# Patient Record
Sex: Female | Born: 1985 | Hispanic: No | Smoking: Former smoker
Health system: Southern US, Community
[De-identification: ages and names within clinical notes are randomized; demographics above are authoritative.]

## PROBLEM LIST (undated history)

## (undated) ENCOUNTER — Inpatient Hospital Stay (HOSPITAL_COMMUNITY): Payer: Self-pay

## (undated) ENCOUNTER — Ambulatory Visit (HOSPITAL_COMMUNITY): Admission: EM | Payer: Medicaid Other | Source: Home / Self Care

## (undated) DIAGNOSIS — K56609 Unspecified intestinal obstruction, unspecified as to partial versus complete obstruction: Secondary | ICD-10-CM

## (undated) DIAGNOSIS — N189 Chronic kidney disease, unspecified: Secondary | ICD-10-CM

## (undated) DIAGNOSIS — T7840XA Allergy, unspecified, initial encounter: Secondary | ICD-10-CM

## (undated) DIAGNOSIS — O24419 Gestational diabetes mellitus in pregnancy, unspecified control: Secondary | ICD-10-CM

## (undated) HISTORY — PX: SMALL INTESTINE SURGERY: SHX150

## (undated) HISTORY — PX: ABDOMINAL SURGERY: SHX537

## (undated) HISTORY — DX: Allergy, unspecified, initial encounter: T78.40XA

---

## 2006-09-19 ENCOUNTER — Emergency Department (HOSPITAL_COMMUNITY): Admission: EM | Admit: 2006-09-19 | Discharge: 2006-09-19 | Payer: Self-pay | Admitting: Family Medicine

## 2007-11-26 ENCOUNTER — Emergency Department (HOSPITAL_COMMUNITY): Admission: EM | Admit: 2007-11-26 | Discharge: 2007-11-26 | Payer: Self-pay | Admitting: Family Medicine

## 2007-12-03 ENCOUNTER — Encounter: Admission: RE | Admit: 2007-12-03 | Discharge: 2007-12-03 | Payer: Self-pay | Admitting: Chiropractic Medicine

## 2007-12-08 ENCOUNTER — Ambulatory Visit: Payer: Self-pay | Admitting: Cardiology

## 2009-06-12 ENCOUNTER — Emergency Department (HOSPITAL_COMMUNITY): Admission: EM | Admit: 2009-06-12 | Discharge: 2009-06-12 | Payer: Self-pay | Admitting: Family Medicine

## 2009-12-26 ENCOUNTER — Emergency Department (HOSPITAL_COMMUNITY): Admission: EM | Admit: 2009-12-26 | Discharge: 2009-12-26 | Payer: Self-pay | Admitting: Family Medicine

## 2010-06-19 ENCOUNTER — Emergency Department (HOSPITAL_COMMUNITY): Admission: EM | Admit: 2010-06-19 | Discharge: 2010-06-19 | Payer: Self-pay | Admitting: Family Medicine

## 2010-10-22 LAB — POCT URINALYSIS DIPSTICK
Bilirubin Urine: NEGATIVE
Glucose, UA: 100 mg/dL — AB
Protein, ur: NEGATIVE mg/dL
Specific Gravity, Urine: 1.02 (ref 1.005–1.030)
Urobilinogen, UA: 2 mg/dL — ABNORMAL HIGH (ref 0.0–1.0)

## 2010-10-22 LAB — POCT PREGNANCY, URINE: Preg Test, Ur: NEGATIVE

## 2010-10-22 LAB — GC/CHLAMYDIA PROBE AMP, GENITAL: GC Probe Amp, Genital: NEGATIVE

## 2010-10-28 LAB — POCT I-STAT, CHEM 8
BUN: 11 mg/dL (ref 6–23)
Calcium, Ion: 1.11 mmol/L — ABNORMAL LOW (ref 1.12–1.32)
Chloride: 106 mEq/L (ref 96–112)
Creatinine, Ser: 0.7 mg/dL (ref 0.4–1.2)
Hemoglobin: 17 g/dL — ABNORMAL HIGH (ref 12.0–15.0)
Potassium: 3.9 mEq/L (ref 3.5–5.1)
Sodium: 138 mEq/L (ref 135–145)
TCO2: 22 mmol/L (ref 0–100)

## 2011-01-29 ENCOUNTER — Inpatient Hospital Stay (INDEPENDENT_AMBULATORY_CARE_PROVIDER_SITE_OTHER)
Admission: RE | Admit: 2011-01-29 | Discharge: 2011-01-29 | Disposition: A | Discharge status: Home / Self Care | Payer: Self-pay | Source: Ambulatory Visit | Attending: Emergency Medicine | Admitting: Emergency Medicine

## 2011-01-29 DIAGNOSIS — N390 Urinary tract infection, site not specified: Secondary | ICD-10-CM

## 2011-01-29 LAB — POCT URINALYSIS DIP (DEVICE)
Bilirubin Urine: NEGATIVE
Specific Gravity, Urine: 1.015 (ref 1.005–1.030)
Urobilinogen, UA: 0.2 mg/dL (ref 0.0–1.0)

## 2011-07-10 ENCOUNTER — Encounter (HOSPITAL_COMMUNITY): Payer: Self-pay | Admitting: Emergency Medicine

## 2011-07-10 ENCOUNTER — Emergency Department (INDEPENDENT_AMBULATORY_CARE_PROVIDER_SITE_OTHER): Payer: Self-pay

## 2011-07-10 ENCOUNTER — Inpatient Hospital Stay (HOSPITAL_COMMUNITY)
Admission: EM | Admit: 2011-07-10 | Discharge: 2011-07-16 | DRG: 390 | Disposition: A | Discharge status: Home / Self Care | Payer: Self-pay | Source: Ambulatory Visit | Attending: General Surgery | Admitting: General Surgery

## 2011-07-10 ENCOUNTER — Emergency Department (HOSPITAL_COMMUNITY)
Admission: EM | Admit: 2011-07-10 | Discharge: 2011-07-10 | Disposition: A | Discharge status: Nursing Home (Includes ICF) | Payer: Self-pay | Source: Home / Self Care | Attending: Family Medicine | Admitting: Family Medicine

## 2011-07-10 ENCOUNTER — Encounter: Payer: Self-pay | Admitting: Emergency Medicine

## 2011-07-10 ENCOUNTER — Emergency Department (HOSPITAL_COMMUNITY): Payer: Self-pay

## 2011-07-10 DIAGNOSIS — K56609 Unspecified intestinal obstruction, unspecified as to partial versus complete obstruction: Principal | ICD-10-CM | POA: Diagnosis present

## 2011-07-10 DIAGNOSIS — Z880 Allergy status to penicillin: Secondary | ICD-10-CM

## 2011-07-10 DIAGNOSIS — R109 Unspecified abdominal pain: Secondary | ICD-10-CM

## 2011-07-10 DIAGNOSIS — K56600 Partial intestinal obstruction, unspecified as to cause: Secondary | ICD-10-CM | POA: Diagnosis present

## 2011-07-10 DIAGNOSIS — F17200 Nicotine dependence, unspecified, uncomplicated: Secondary | ICD-10-CM | POA: Diagnosis present

## 2011-07-10 LAB — POCT URINALYSIS DIP (DEVICE)
Glucose, UA: NEGATIVE mg/dL
Hgb urine dipstick: NEGATIVE
pH: 7 (ref 5.0–8.0)

## 2011-07-10 LAB — URINALYSIS, ROUTINE W REFLEX MICROSCOPIC
Glucose, UA: 100 mg/dL — AB
Ketones, ur: 40 mg/dL — AB
Protein, ur: 30 mg/dL — AB
Urobilinogen, UA: 1 mg/dL (ref 0.0–1.0)

## 2011-07-10 LAB — CBC
HCT: 49.2 % — ABNORMAL HIGH (ref 36.0–46.0)
MCH: 31.2 pg (ref 26.0–34.0)
MCV: 88.3 fL (ref 78.0–100.0)
Platelets: 321 10*3/uL (ref 150–400)
RBC: 5.57 MIL/uL — ABNORMAL HIGH (ref 3.87–5.11)
WBC: 18.4 10*3/uL — ABNORMAL HIGH (ref 4.0–10.5)

## 2011-07-10 LAB — BASIC METABOLIC PANEL
Chloride: 98 mEq/L (ref 96–112)
Creatinine, Ser: 0.53 mg/dL (ref 0.50–1.10)
GFR calc Af Amer: >90 mL/min (ref >90)
GFR calc non Af Amer: >90 mL/min (ref >90)
Sodium: 133 mEq/L — ABNORMAL LOW (ref 135–145)

## 2011-07-10 LAB — DIFFERENTIAL
Basophils Absolute: 0 10*3/uL (ref 0.0–0.1)
Eosinophils Absolute: 0 10*3/uL (ref 0.0–0.7)
Eosinophils Relative: 0 % (ref 0–5)
Lymphocytes Relative: 6 % — ABNORMAL LOW (ref 12–46)
Monocytes Absolute: 0.4 10*3/uL (ref 0.1–1.0)
Monocytes Relative: 2 % — ABNORMAL LOW (ref 3–12)
Neutro Abs: 16.9 10*3/uL — ABNORMAL HIGH (ref 1.7–7.7)
Neutrophils Relative %: 92 % — ABNORMAL HIGH (ref 43–77)

## 2011-07-10 LAB — POCT PREGNANCY, URINE
Preg Test, Ur: NEGATIVE
Preg Test, Ur: NEGATIVE

## 2011-07-10 MED ORDER — ONDANSETRON 4 MG PO TBDP
ORAL_TABLET | ORAL | Status: AC
  Filled 2011-07-10: qty 2

## 2011-07-10 MED ORDER — ONDANSETRON HCL 4 MG/2ML IJ SOLN
4.0000 mg | Freq: Once | INTRAMUSCULAR | Status: DC
  Filled 2011-07-10 (×3): qty 2

## 2011-07-10 MED ORDER — MORPHINE SULFATE 4 MG/ML IJ SOLN
4.0000 mg | Freq: Once | INTRAMUSCULAR | Status: AC
  Administered 2011-07-10: 4 mg via INTRAVENOUS
  Filled 2011-07-10: qty 1

## 2011-07-10 MED ORDER — SODIUM CHLORIDE 0.9 % IV BOLUS (SEPSIS)
1000.0000 mL | Freq: Once | INTRAVENOUS | Status: AC
  Administered 2011-07-10: 1000 mL via INTRAVENOUS

## 2011-07-10 MED ORDER — IOHEXOL 300 MG/ML  SOLN
100.0000 mL | Freq: Once | INTRAMUSCULAR | Status: AC | PRN
  Administered 2011-07-10: 100 mL via INTRAVENOUS

## 2011-07-10 MED ORDER — ONDANSETRON 8 MG PO TBDP: 8.0000 mg | ORAL_TABLET | Freq: Once | ORAL | Status: DC

## 2011-07-10 NOTE — ED Notes (Signed)
Pt back from X-ray.  States she feels "good".  Denies nausea or pain at this time.

## 2011-07-10 NOTE — ED Notes (Signed)
Pt here with c/o sudden onset lower abd cramping radiating to lower back that started last night with diff sleeping.pt also c/o n/v with sx.x3 emesis today,chills.pt states she has been having diff with defecation so she took laxative but no changes in sx.last bm yesterday but small.

## 2011-07-10 NOTE — ED Notes (Signed)
Family at bedside. 

## 2011-07-10 NOTE — ED Notes (Signed)
PT. REPORTS MID ABDOMINAL PAIN /LOW BACK PAIN ONSET LAST NIGHT , DENIES DYSURIA ,  VOMITTING WITH NO DIARRHEA ,  SLIGHT CHILLS.

## 2011-07-10 NOTE — ED Notes (Signed)
IV team called to start IV,  After 2 attempts

## 2011-07-10 NOTE — ED Provider Notes (Signed)
History     CSN: [id number] Arrival date & time: 07/10/2011  6:46 PM   First MD Initiated Contact with Patient 07/10/11 2008      Chief Complaint  Patient presents with  . Abdominal Pain    (Consider location/radiation/quality/duration/timing/severity/associated sxs/prior treatment) HPI Comments: Pt presented from Urgent care w chief complaint of , pain, nausea, and vomiting without diarrhea.  Patient was sent from urgent care for possible small bowel obstruction.  Patient does have a history of abdominal surgery as a child however she is unaware of what the surgery was.  Patient has been unable to hold down fluids.  She states she is dehydrated and having abdominal pain as well.  The pain is diffuse and rated at a 10 out of 10.  Patient denies fever, night sweats, or chills.  She has no other complaints.  The history is provided by the patient.    History reviewed. No pertinent past medical history.  History reviewed. No pertinent past surgical history.  No family history on file.  History  Substance Use Topics  . Smoking status: Current Everyday Smoker  . Smokeless tobacco: Not on file  . Alcohol Use: Yes    OB History    Grav Para Term Preterm Abortions TAB SAB Ect Mult Living                  Review of Systems  Constitutional: Positive for appetite change. Negative for fever and chills.  HENT: Negative for neck stiffness and dental problem.   Eyes: Negative for visual disturbance.  Respiratory: Negative for cough, chest tightness, shortness of breath and wheezing.   Cardiovascular: Negative for chest pain.  Gastrointestinal: Positive for nausea, vomiting and abdominal pain. Negative for diarrhea, constipation, blood in stool, abdominal distention, anal bleeding and rectal pain.  Genitourinary: Negative for dysuria, urgency, hematuria and flank pain.  Musculoskeletal: Negative for myalgias and arthralgias.  Skin: Negative for rash.  Neurological: Negative for  dizziness, syncope, speech difficulty, numbness and headaches.  Hematological: Does not bruise/bleed easily.  All other systems reviewed and are negative.    Allergies  Penicillins  Home Medications   Current Outpatient Rx  Name Route Sig Dispense Refill  . IBUPROFEN 600 MG PO TABS Oral Take 600 mg by mouth every 6 (six) hours as needed. For pain       BP 101/68  Pulse 87  Temp(Src) 97.7 F (36.5 C) (Oral)  Resp 19  SpO2 94%  LMP 06/11/2011  Physical Exam  Nursing note and vitals reviewed. Constitutional: She is oriented to person, place, and time. She appears well-developed and well-nourished. No distress.  HENT:  Head: Normocephalic and atraumatic.  Eyes: EOM are normal.       Normal appearance  Neck: Normal range of motion.  Cardiovascular: Normal rate and regular rhythm.   Pulmonary/Chest: Effort normal and breath sounds normal.  Abdominal: Soft. She exhibits no distension and no mass. Bowel sounds are decreased. There is generalized tenderness. There is no rebound and no guarding.       No CVA tenderness  Musculoskeletal: Normal range of motion.  Neurological: She is alert and oriented to person, place, and time.  Skin: Skin is warm and dry. No rash noted.  Psychiatric: She has a normal mood and affect. Her behavior is normal.    ED Course  Procedures (including critical care time)  Labs Reviewed  BASIC METABOLIC PANEL - Abnormal; Notable for the following:    Sodium 133 (*)  Glucose, Bld 136 (*)    All other components within normal limits  CBC - Abnormal; Notable for the following:    WBC 18.4 (*)    RBC 5.57 (*)    Hemoglobin 17.4 (*)    HCT 49.2 (*)    All other components within normal limits  DIFFERENTIAL - Abnormal; Notable for the following:    Neutrophils Relative 92 (*)    Neutro Abs 16.9 (*)    Lymphocytes Relative 6 (*)    Monocytes Relative 2 (*)    All other components within normal limits  URINALYSIS, ROUTINE W REFLEX MICROSCOPIC -  Abnormal; Notable for the following:    Color, Urine AMBER (*) BIOCHEMICALS MAY BE AFFECTED BY COLOR   APPearance CLOUDY (*)    Glucose, UA 100 (*)    Ketones, ur 40 (*)    Protein, ur 30 (*)    Leukocytes, UA SMALL (*)    All other components within normal limits  URINE MICROSCOPIC-ADD ON - Abnormal; Notable for the following:    Squamous Epithelial / LPF MANY (*)    Bacteria, UA FEW (*)    All other components within normal limits  POCT PREGNANCY, URINE  POCT PREGNANCY, URINE   Dg Abd 1 View  07/10/2011  *RADIOLOGY REPORT*  Clinical Data: Lower abdominal pain  ABDOMEN - 1 VIEW  Comparison: None.  Findings: Distended small bowel loops in the left upper quadrant are noted.  Moderate stool in the ascending and proximal transverse colon.  No pneumatosis.  No obvious free intraperitoneal gas.  Bony framework is unremarkable.  Unremarkable soft tissues.  Square radiodensity projects over the pelvis just to the right of midline of unknown significance and may be external to the patient.  IMPRESSION: Dilated small bowel loops in the left upper quadrant as described worrisome for a early or partial small bowel obstruction pattern.  Original Report Authenticated By: Donavan Burnet, M.D.   Ct Abdomen Pelvis W Contrast  07/10/2011  *RADIOLOGY REPORT*  Clinical Data: Abdominal pain, nausea and vomiting.  CT ABDOMEN AND PELVIS WITH CONTRAST  Technique:  Multidetector CT imaging of the abdomen and pelvis was performed following the standard protocol during bolus administration of intravenous contrast.  Contrast: OMNIPAQUE IOHEXOL 300 MG/ML IV SOLN  Comparison: None.  Findings: There is evidence of small bowel obstruction with diffuse dilatation of the jejunum. There is a discrete transition point in the midline upper pelvis either at the level of the distal jejunum or proximal ileum.  No evidence of bowel perforation or focal abscess.  Associated small amount of free fluid is present in the pelvis and  adjacent to the liver.  The proximal colon contains some stool. The distal colon is decompressed.  There is mild fatty infiltration of the liver.  The gallbladder is unremarkable.  The pancreas, spleen, adrenal glands and kidneys are within normal limits.  No masses or enlarged lymph nodes.  No hernias.  Bony structures are unremarkable.  No abnormal calcifications.  IMPRESSION: Small bowel obstruction with diffuse dilatation of the jejunum.  A discrete transition point in the midline upper pelvis is located either at the level of the distal jejunum or proximal ileum.  Original Report Authenticated By: Reola Calkins, M.D.    12:28 AM  Small bowel obstruction found on CT.  Dr. Janee Morn of Gen[facility] surgery will come to the emergency department and admit patient.  Patient states that her abdominal pain and nausea are starting to return.  4 morphine  and 4 Zofran will be given.  Patient is otherwise hemodynamically stable and has no complaints.   MDM  SBO        [place], [place] 07/11/11 0028

## 2011-07-10 NOTE — ED Provider Notes (Signed)
History     CSN: [id number] Arrival date & time: 07/10/2011  4:11 PM   First MD Initiated Contact with Patient 07/10/11 1541      Chief Complaint  Patient presents with  . Abdominal Cramping  . Nausea  . Emesis    (Consider location/radiation/quality/duration/timing/severity/associated sxs/prior treatment) HPI Comments: Suzanne Lane presents for evaluation of abdominal pain, acute onset yesterday. She reports sharp lower abdominal pain that started last night. She reports some constipation over the last 2 days. 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   Patient is a 25 y.o. female presenting with abdominal pain. The history is provided by the patient.  Abdominal Pain The primary symptoms of the illness include abdominal pain and nausea. The primary symptoms of the illness do not include fever or hematochezia. The current episode started yesterday. The problem has not changed since onset. The illness is associated with awakening from sleep. The patient states that she believes she is currently not pregnant. The patient has had a change in bowel habit. Additional symptoms associated with the illness include constipation.    History reviewed. No pertinent past medical history.  History reviewed. No pertinent past surgical history.  No family history on file.  History  Substance Use Topics  . Smoking status: Current Everyday Smoker  . Smokeless tobacco: Not on file  . Alcohol Use: Yes    OB History    Grav Para Term Preterm Abortions TAB SAB Ect Mult Living                  Review of Systems  Constitutional: Negative.  Negative for fever.  HENT: Negative.   Eyes: Negative.   Respiratory: Negative.   Cardiovascular: Negative.   Gastrointestinal: Positive for nausea, abdominal pain and constipation. Negative for hematochezia.  Genitourinary: Negative.   Musculoskeletal: Negative.   Skin: Negative.   Neurological: Negative.     Allergies    Penicillins  Home Medications  No current outpatient prescriptions on file.  BP 135/63  Pulse 72  Temp(Src) 99 F (37.2 C) (Oral)  Resp 16  SpO2 98%  LMP 06/11/2011  Physical Exam  Nursing note and vitals reviewed. Constitutional: She is oriented to person, place, and time. She appears well-developed and well-nourished.  HENT:  Head: Normocephalic and atraumatic.  Eyes: EOM are normal.  Neck: Normal range of motion.  Pulmonary/Chest: Effort normal.  Abdominal: Soft. Bowel sounds are normal. There is tenderness.  Neurological: She is alert and oriented to person, place, and time.  Skin: Skin is warm and dry.    ED Course  Procedures (including critical care time)  Labs Reviewed  POCT URINALYSIS DIP (DEVICE) - Abnormal; Notable for the following:    Bilirubin Urine SMALL (*)    Ketones, ur TRACE (*)    Protein, ur 30 (*)    Leukocytes, UA TRACE (*) Biochemical Testing Only. Please order routine urinalysis from main lab if confirmatory testing is needed.   All other components within normal limits  POCT PREGNANCY, URINE  POCT URINALYSIS DIPSTICK   Dg Abd 1 View  07/10/2011  *RADIOLOGY REPORT*  Clinical Data: Lower abdominal pain  ABDOMEN - 1 VIEW  Comparison: None.  Findings: Distended small bowel loops in the left upper quadrant are noted.  Moderate stool in the ascending and proximal transverse colon.  No pneumatosis.  No obvious free intraperitoneal gas.  Bony framework is unremarkable.  Unremarkable soft tissues.  Square radiodensity projects over the pelvis just  to the right of midline of unknown significance and may be external to the patient.  IMPRESSION: Dilated small bowel loops in the left upper quadrant as described worrisome for a early or partial small bowel obstruction pattern.  Original Report Authenticated By: Donavan Burnet, M.D.     No diagnosis found.    MDM  Abdomen x-ray: stool burden; gas pattern worrisome for early or partial SBO (per  radiologist, per above)        Richardo Priest, MD 07/10/11 1728

## 2011-07-10 NOTE — ED Notes (Signed)
Returned from CT.

## 2011-07-11 ENCOUNTER — Encounter (HOSPITAL_COMMUNITY): Payer: Self-pay | Admitting: General Surgery

## 2011-07-11 LAB — CBC
HCT: 43.3 % (ref 36.0–46.0)
Hemoglobin: 15 g/dL (ref 12.0–15.0)
MCH: 30.9 pg (ref 26.0–34.0)
MCHC: 34.6 g/dL (ref 30.0–36.0)
MCV: 89.1 fL (ref 78.0–100.0)
RBC: 4.86 MIL/uL (ref 3.87–5.11)

## 2011-07-11 LAB — BASIC METABOLIC PANEL
BUN: 10 mg/dL (ref 6–23)
CO2: 22 mEq/L (ref 19–32)
Chloride: 103 mEq/L (ref 96–112)
Glucose, Bld: 100 mg/dL — ABNORMAL HIGH (ref 70–99)
Potassium: 3.4 mEq/L — ABNORMAL LOW (ref 3.5–5.1)
Sodium: 138 mEq/L (ref 135–145)

## 2011-07-11 MED ORDER — ONDANSETRON HCL 4 MG/2ML IJ SOLN
4.0000 mg | Freq: Four times a day (QID) | INTRAMUSCULAR | Status: DC | PRN
  Administered 2011-07-11 – 2011-07-14 (×5): 4 mg via INTRAVENOUS
  Filled 2011-07-11 (×2): qty 2

## 2011-07-11 MED ORDER — PANTOPRAZOLE SODIUM 40 MG IV SOLR
40.0000 mg | Freq: Every day | INTRAVENOUS | Status: DC
  Administered 2011-07-11 – 2011-07-14 (×4): 40 mg via INTRAVENOUS
  Filled 2011-07-11 (×5): qty 40

## 2011-07-11 MED ORDER — NICOTINE 14 MG/24HR TD PT24
14.0000 mg | MEDICATED_PATCH | Freq: Every day | TRANSDERMAL | Status: DC
  Administered 2011-07-11 – 2011-07-15 (×5): 14 mg via TRANSDERMAL
  Filled 2011-07-11 (×6): qty 1

## 2011-07-11 MED ORDER — KCL IN DEXTROSE-NACL 20-5-0.9 MEQ/L-%-% IV SOLN
INTRAVENOUS | Status: DC
  Administered 2011-07-11 – 2011-07-12 (×2): via INTRAVENOUS
  Administered 2011-07-12 – 2011-07-13 (×2): 125 mL/h via INTRAVENOUS
  Administered 2011-07-14 – 2011-07-16 (×4): via INTRAVENOUS
  Filled 2011-07-11 (×18): qty 1000

## 2011-07-11 MED ORDER — KCL IN DEXTROSE-NACL 20-5-0.9 MEQ/L-%-% IV SOLN
INTRAVENOUS | Status: DC
  Filled 2011-07-11 (×5): qty 1000

## 2011-07-11 MED ORDER — DIPHENHYDRAMINE HCL 12.5 MG/5ML PO ELIX: 12.5000 mg | ORAL_SOLUTION | Freq: Four times a day (QID) | ORAL | Status: DC | PRN

## 2011-07-11 MED ORDER — DIPHENHYDRAMINE HCL 50 MG/ML IJ SOLN
12.5000 mg | Freq: Four times a day (QID) | INTRAMUSCULAR | Status: DC | PRN
  Filled 2011-07-11: qty 1

## 2011-07-11 MED ORDER — MORPHINE SULFATE 2 MG/ML IJ SOLN
2.0000 mg | INTRAMUSCULAR | Status: DC | PRN
  Administered 2011-07-11 – 2011-07-12 (×8): 2 mg via INTRAVENOUS
  Administered 2011-07-12: 4 mg via INTRAVENOUS
  Administered 2011-07-12 – 2011-07-14 (×9): 2 mg via INTRAVENOUS
  Filled 2011-07-11 (×10): qty 1
  Filled 2011-07-11: qty 2
  Filled 2011-07-11 (×10): qty 1

## 2011-07-11 NOTE — ED Notes (Signed)
NG tube placed and verified by auscultation and suction contents (450 ml of clear fluid).  Placed on lo intermittent suction.

## 2011-07-11 NOTE — ED Notes (Signed)
Surgery in with pt.

## 2011-07-11 NOTE — ED Notes (Signed)
Dr Janee Morn here

## 2011-07-11 NOTE — H&P (Signed)
Suzanne Lane is an 25 y.o. female.   Chief Complaint: Abdominal pain, nausea and vomiting HPI: Patient developed crampy abdominal pain associated with nausea and vomiting over the past 24 hours. Patient has had similar episodes more mild in the past. She has not been seen by a physician for this recently. She describes her pain as crampy and radiates towards her back. She has not had a bowel movement today. She has been passing less gas than usual. 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  History reviewed. No pertinent past medical history.  History reviewed. No pertinent past surgical history. patient had abdominal surgery as a baby  No family history on file. Social History:  reports that she has been smoking.  She does not have any smokeless tobacco history on file. She reports that she drinks alcohol. She reports that she does not use illicit drugs.  Allergies:  Allergies  Allergen Reactions  . Penicillins     Hives     Medications Prior to Admission  Medication Dose Route Frequency Provider Last Rate Last Dose  . iohexol (OMNIPAQUE) 300 MG/ML injection 100 mL  100 mL Intravenous Once PRN Medication Radiologist   100 mL at 07/10/11 2338  . morphine 4 MG/ML injection 4 mg  4 mg Intravenous Once [place], [place]   4 mg at 07/10/11 2157  . ondansetron (ZOFRAN) injection 4 mg  4 mg Intramuscular Once [employer], [place]      . ondansetron (ZOFRAN-ODT) disintegrating tablet 8 mg  8 mg Oral Once Ethelda Chick, MD      . sodium chloride 0.9 % bolus 1,000 mL  1,000 mL Intravenous Once [place], [place]   1,000 mL at 07/10/11 2219  . DISCONTD: ondansetron (ZOFRAN-ODT) 4 MG disintegrating tablet            No current outpatient prescriptions on file as of 07/10/2011.    Results for orders placed during the hospital encounter of 07/10/11 (from the past 48 hour(s))  BASIC METABOLIC PANEL     Status:  Abnormal   Collection Time   07/10/11  8:19 PM      Component Value Range Comment   Sodium 133 (*) 135 - 145 (mEq/L)    Potassium 4.0  3.5 - 5.1 (mEq/L)    Chloride 98  96 - 112 (mEq/L)    CO2 23  19 - 32 (mEq/L)    Glucose, Bld 136 (*) 70 - 99 (mg/dL)    BUN 12  6 - 23 (mg/dL)    Creatinine, Ser 1.30  0.50 - 1.10 (mg/dL)    Calcium 9.3  8.4 - 10.5 (mg/dL)    GFR calc non Af Amer >90  >90 (mL/min)    GFR calc Af Amer >90  >90 (mL/min)   CBC     Status: Abnormal   Collection Time   07/10/11  8:19 PM      Component Value Range Comment   WBC 18.4 (*) 4.0 - 10.5 (K/uL)    RBC 5.57 (*) 3.87 - 5.11 (MIL/uL)    Hemoglobin 17.4 (*) 12.0 - 15.0 (g/dL)    HCT 86.5 (*) 78.4 - 46.0 (%)    MCV 88.3  78.0 - 100.0 (fL)    MCH 31.2  26.0 - 34.0 (pg)    MCHC 35.4  30.0 - 36.0 (g/dL)    RDW 69.6  29.5 - 28.4 (%)  Platelets 321  150 - 400 (K/uL)   DIFFERENTIAL     Status: Abnormal   Collection Time   07/10/11  8:19 PM      Component Value Range Comment   Neutrophils Relative 92 (*) 43 - 77 (%)    Neutro Abs 16.9 (*) 1.7 - 7.7 (K/uL)    Lymphocytes Relative 6 (*) 12 - 46 (%)    Lymphs Abs 1.1  0.7 - 4.0 (K/uL)    Monocytes Relative 2 (*) 3 - 12 (%)    Monocytes Absolute 0.4  0.1 - 1.0 (K/uL)    Eosinophils Relative 0  0 - 5 (%)    Eosinophils Absolute 0.0  0.0 - 0.7 (K/uL)    Basophils Relative 0  0 - 1 (%)    Basophils Absolute 0.0  0.0 - 0.1 (K/uL)   URINALYSIS, ROUTINE W REFLEX MICROSCOPIC     Status: Abnormal   Collection Time   07/10/11  8:49 PM      Component Value Range Comment   Color, Urine AMBER (*) YELLOW  BIOCHEMICALS MAY BE AFFECTED BY COLOR   APPearance CLOUDY (*) CLEAR     Specific Gravity, Urine 1.029  1.005 - 1.030     pH 6.5  5.0 - 8.0     Glucose, UA 100 (*) NEGATIVE (mg/dL)    Hgb urine dipstick NEGATIVE  NEGATIVE     Bilirubin Urine NEGATIVE  NEGATIVE     Ketones, ur 40 (*) NEGATIVE (mg/dL)    Protein, ur 30 (*) NEGATIVE (mg/dL)    Urobilinogen, UA 1.0  0.0 -  1.0 (mg/dL)    Nitrite NEGATIVE  NEGATIVE     Leukocytes, UA SMALL (*) NEGATIVE    URINE MICROSCOPIC-ADD ON     Status: Abnormal   Collection Time   07/10/11  8:49 PM      Component Value Range Comment   Squamous Epithelial / LPF MANY (*) RARE     WBC, UA 3-6  <3 (WBC/hpf)    RBC / HPF 0-2  <3 (RBC/hpf)    Bacteria, UA FEW (*) RARE     Urine-Other MUCOUS PRESENT     POCT PREGNANCY, URINE     Status: Normal   Collection Time   07/10/11 10:44 PM      Component Value Range Comment   Preg Test, Ur NEGATIVE      Dg Abd 1 View  07/10/2011  *RADIOLOGY REPORT*  Clinical Data: Lower abdominal pain  ABDOMEN - 1 VIEW  Comparison: None.  Findings: Distended small bowel loops in the left upper quadrant are noted.  Moderate stool in the ascending and proximal transverse colon.  No pneumatosis.  No obvious free intraperitoneal gas.  Bony framework is unremarkable.  Unremarkable soft tissues.  Square radiodensity projects over the pelvis just to the right of midline of unknown significance and may be external to the patient.  IMPRESSION: Dilated small bowel loops in the left upper quadrant as described worrisome for a early or partial small bowel obstruction pattern.  Original Report Authenticated By: Donavan Burnet, M.D.   Ct Abdomen Pelvis W Contrast  07/10/2011  *RADIOLOGY REPORT*  Clinical Data: Abdominal pain, nausea and vomiting.  CT ABDOMEN AND PELVIS WITH CONTRAST  Technique:  Multidetector CT imaging of the abdomen and pelvis was performed following the standard protocol during bolus administration of intravenous contrast.  Contrast: OMNIPAQUE IOHEXOL 300 MG/ML IV SOLN  Comparison: None.  Findings: There is evidence of small bowel obstruction  with diffuse dilatation of the jejunum. There is a discrete transition point in the midline upper pelvis either at the level of the distal jejunum or proximal ileum.  No evidence of bowel perforation or focal abscess.  Associated small amount of free  fluid is present in the pelvis and adjacent to the liver.  The proximal colon contains some stool. The distal colon is decompressed.  There is mild fatty infiltration of the liver.  The gallbladder is unremarkable.  The pancreas, spleen, adrenal glands and kidneys are within normal limits.  No masses or enlarged lymph nodes.  No hernias.  Bony structures are unremarkable.  No abnormal calcifications.  IMPRESSION: Small bowel obstruction with diffuse dilatation of the jejunum.  A discrete transition point in the midline upper pelvis is located either at the level of the distal jejunum or proximal ileum.  Original Report Authenticated By: Reola Calkins, M.D.    Review of Systems  Constitutional: Negative.   HENT: Negative.   Eyes: Negative.   Respiratory: Negative.   Cardiovascular: Negative.   Gastrointestinal: Positive for nausea, vomiting and abdominal pain. Negative for heartburn, diarrhea and blood in stool.  Genitourinary: Negative.   Musculoskeletal: Negative.   Skin: Negative.   Neurological: Negative.     Blood pressure 101/68, pulse 87, temperature 97.7 F (36.5 C), temperature source Oral, resp. rate 19, last menstrual period 06/11/2011, SpO2 94.00%. Physical Exam  Constitutional: She is oriented to person, place, and time. She appears well-developed and well-nourished.  HENT:  Head: Normocephalic and atraumatic.  Eyes: EOM are normal. Pupils are equal, round, and reactive to light.  Neck: Normal range of motion. Neck supple. No tracheal deviation present.  Cardiovascular: Normal rate, regular rhythm and normal heart sounds.   No murmur heard. Respiratory: Effort normal and breath sounds normal. No respiratory distress. She has no wheezes. She has no rales. She exhibits no tenderness.  GI: Soft. She exhibits distension. She exhibits no mass. There is tenderness. There is no rebound and no guarding.       BS occasionally high pitched, mild tenderness  Musculoskeletal: Normal  range of motion.  Lymphadenopathy:    She has no cervical adenopathy.  Neurological: She is alert and oriented to person, place, and time.     Assessment/Plan Small bowel obstruction likely due to adhesions from prior surgery. Patient is dehydrated. Plan admission, IV fluids, and we will follow her exam and clinical course. We will place NG tube. I advised the patient this obstruction does not resolve with the above treatments that she may need surgery. Questions were answered.  Michayla Mcneil E 07/11/2011, 12:42 AM

## 2011-07-11 NOTE — ED Provider Notes (Signed)
Medical screening examination/treatment/procedure(s) were performed by non-physician practitioner and as supervising physician I was immediately available for consultation/collaboration.  Ethelda Chick, MD 07/11/11 [phone]

## 2011-07-11 NOTE — ED Notes (Signed)
Paged Dr Janee Morn to [phone]

## 2011-07-11 NOTE — Progress Notes (Addendum)
Subjective: Feels better with NG drainage, about in cannister.  Abd less distended. Objective: Vital signs in last 24 hours: Temp:  [97.3 F (36.3 C)-99 F (37.2 C)] 98.2 F (36.8 C) (11/30 0532) Pulse Rate:  [72-90] 86  (11/30 0532) Resp:  [16-19] 18  (11/30 0532) BP: (101-135)/(63-85) 117/74 mmHg (11/30 0532) SpO2:  [94 %-99 %] 96 % (11/30 0532) Last BM Date: 07/09/11  Intake/Output from previous day:   Intake/Output this shift:    General appearance: alert, cooperative and no distress GI: soft, slightly tender, not very distended.   Lab Results:   Basename 07/11/11 0553 07/10/11 2019  WBC 19.2* 18.4*  HGB 15.0 17.4*  HCT 43.3 49.2*  PLT 299 321    BMET  Basename 07/11/11 0553 07/10/11 2019  NA 138 133*  K 3.4* 4.0  CL 103 98  CO2 22 23  GLUCOSE 100* 136*  BUN 10 12  CREATININE 0.54 0.53  CALCIUM 8.3* 9.3   PT/INR No results found for this basename: LABPROT:2,INR:2 in the last 72 hours   Studies/Results: Dg Abd 1 View  07/10/2011  *RADIOLOGY REPORT*  Clinical Data: Lower abdominal pain  ABDOMEN - 1 VIEW  Comparison: None.  Findings: Distended small bowel loops in the left upper quadrant are noted.  Moderate stool in the ascending and proximal transverse colon.  No pneumatosis.  No obvious free intraperitoneal gas.  Bony framework is unremarkable.  Unremarkable soft tissues.  Square radiodensity projects over the pelvis just to the right of midline of unknown significance and may be external to the patient.  IMPRESSION: Dilated small bowel loops in the left upper quadrant as described worrisome for a early or partial small bowel obstruction pattern.  Original Report Authenticated By: Donavan Burnet, M.D.   Ct Abdomen Pelvis W Contrast  07/10/2011  *RADIOLOGY REPORT*  Clinical Data: Abdominal pain, nausea and vomiting.  CT ABDOMEN AND PELVIS WITH CONTRAST  Technique:  Multidetector CT imaging of the abdomen and pelvis was performed following the  standard protocol during bolus administration of intravenous contrast.  Contrast: OMNIPAQUE IOHEXOL 300 MG/ML IV SOLN  Comparison: None.  Findings: There is evidence of small bowel obstruction with diffuse dilatation of the jejunum. There is a discrete transition point in the midline upper pelvis either at the level of the distal jejunum or proximal ileum.  No evidence of bowel perforation or focal abscess.  Associated small amount of free fluid is present in the pelvis and adjacent to the liver.  The proximal colon contains some stool. The distal colon is decompressed.  There is mild fatty infiltration of the liver.  The gallbladder is unremarkable.  The pancreas, spleen, adrenal glands and kidneys are within normal limits.  No masses or enlarged lymph nodes.  No hernias.  Bony structures are unremarkable.  No abnormal calcifications.  IMPRESSION: Small bowel obstruction with diffuse dilatation of the jejunum.  A discrete transition point in the midline upper pelvis is located either at the level of the distal jejunum or proximal ileum.  Original Report Authenticated By: Reola Calkins, M.D.    Anti-infectives: Anti-infectives    None     Current Facility-Administered Medications  Medication Dose Route Frequency Provider Last Rate Last Dose  . dextrose 5 % and 0.9 % NaCl with KCl 20 mEq/L infusion   Intravenous Continuous Liz Malady, MD      . diphenhydrAMINE (BENADRYL) injection 12.5-25 mg  12.5-25 mg Intravenous Q6H PRN Liz Malady, MD  Or  . diphenhydrAMINE (BENADRYL) 12.5 MG/5ML elixir 12.5-25 mg  12.5-25 mg Oral Q6H PRN Liz Malady, MD      . iohexol (OMNIPAQUE) 300 MG/ML injection 100 mL  100 mL Intravenous Once PRN Medication Radiologist   100 mL at 07/10/11 2338  . morphine 2 MG/ML injection 2-4 mg  2-4 mg Intravenous Q1H PRN Liz Malady, MD   2 mg at 07/11/11 0940  . morphine 4 MG/ML injection 4 mg  4 mg Intravenous Once [place], [place]   4 mg at 07/10/11  2157  . nicotine (NICODERM CQ - dosed in mg/24 hours) patch 14 mg  14 mg Transdermal Daily Liz Malady, MD   14 mg at 07/11/11 0942  . ondansetron (ZOFRAN) injection 4 mg  4 mg Intramuscular Once [place], [place]      . ondansetron [facility]) injection 4 mg  4 mg Intravenous Q6H PRN Liz Malady, MD   4 mg at 07/11/11 0939  . ondansetron (ZOFRAN-ODT) disintegrating tablet 8 mg  8 mg Oral Once Ethelda Chick, MD      . pantoprazole (PROTONIX) injection 40 mg  40 mg Intravenous QHS Liz Malady, MD      . sodium chloride 0.9 % bolus 1,000 mL  1,000 mL Intravenous Once [place], [place]   1,000 mL at 07/10/11 2219  . DISCONTD: ondansetron (ZOFRAN-ODT) 4 MG disintegrating tablet             Assessment/Plan  SBO Question of necrotizing enterocolitis as a premature new born, she has a small incision on her abd. PLAN:  Continue NG, and follow, I will check 2 view in AM.  LOS: 1 day    JENNINGS,WILLARD 07/11/2011  Patient feels better.  Abdomen softer.  Sister and boyfriend in room. (I operated on boyfriend for a perforated ulcer about 5 years ago). Will check flat and upright films in AM.  D. Dhrithi Riche  07/11/2011.

## 2011-07-11 NOTE — ED Notes (Signed)
Attempted to give report.  Was told by the secretary that RN Barbette Or would call me back.

## 2011-07-11 NOTE — Progress Notes (Signed)
Pt admitted with severe abdominal cramping and pain of mid stomach-epigastric area that goes to her back for 2 days. Pt also repts nausea and vomiting with pain during the last two days. Pt LBM 11/28 and pt repts BM was "normal" for her. Pt repts that along with the pain over the past two days and even now, she "feels like I need to have a BM". Pt repts that over the last two days she feels distended and bloated. Abdomen is soft but not overly distended. Pt denies passing gas. BS are faint to nonexistent. Pt, per her admission, has had a long standing history of these symptoms but "not this bad". Pt has a NG to R nares to low intermittent suction  that is draining mod amount of particulate brown emesis. Pt is voiding without difficulty. Pt lungs CTA. No s/sx resp or cardiac distress and no c/o such. Pt has decreased ROM of abdomen related to pain and therefore along with the tubes has unsteady gait. Pt has a supportive significant other at bedside at all times to assist to [facility]. No pressure skin issues. Pt turns self and floats heels.

## 2011-07-12 ENCOUNTER — Inpatient Hospital Stay (HOSPITAL_COMMUNITY): Payer: Self-pay

## 2011-07-12 LAB — CBC
MCH: 30.1 pg (ref 26.0–34.0)
Platelets: 250 10*3/uL (ref 150–400)
RBC: 4.48 MIL/uL (ref 3.87–5.11)

## 2011-07-12 LAB — COMPREHENSIVE METABOLIC PANEL
ALT: 10 U/L (ref 0–35)
AST: 11 U/L (ref 0–37)
CO2: 28 mEq/L (ref 19–32)
Calcium: 7.9 mg/dL — ABNORMAL LOW (ref 8.4–10.5)
Sodium: 141 mEq/L (ref 135–145)
Total Protein: 6.1 g/dL (ref 6.0–8.3)

## 2011-07-12 NOTE — Progress Notes (Signed)
Abdomen is benign and contrast into colon. Hopefully resolving SBO. Plans per [facility]

## 2011-07-12 NOTE — Progress Notes (Signed)
Patient ID: Suzanne Lane, female   DOB: [birth year], 25 y.o.   MRN: [id number]    Subjective: Doesn't feel all that much better, film is better. 1300 thru NG, but taking allot of ice. Objective: Vital signs in last 24 hours: Temp:  [97.6 F (36.4 C)-98.3 F (36.8 C)] 98 F (36.7 C) (12/01 0612) Pulse Rate:  [63-82] 63  (12/01 0612) Resp:  [18] 18  (12/01 0612) BP: (101-114)/(61-70) 101/70 mmHg (12/01 0612) SpO2:  [96 %-99 %] 96 % (12/01 0612) Last BM Date: 07/09/11  Intake/Output from previous day: 11/30 0701 - 12/01 0700 In: 2574.6 [I.V.:2564.6; IV Piggyback:10] Out: 2450 [Urine:500; Emesis/NG output:1300] Intake/Output this shift:    General appearance: alert, cooperative and no distress GI: soft, slightly tender, not very distended.  Less tender than yesterday. No flatus so far. Lab Results:   Basename 07/12/11 0600 07/11/11 0553  WBC 14.0* 19.2*  HGB 13.5 15.0  HCT 41.4 43.3  PLT 250 299    BMET  Basename 07/12/11 0600 07/11/11 0553  NA 141 138  K 3.9 3.4*  CL 110 103  CO2 28 22  GLUCOSE 110* 100*  BUN 8 10  CREATININE 0.67 0.54  CALCIUM 7.9* 8.3*   PT/INR No results found for this basename: LABPROT:2,INR:2 in the last 72 hours   Studies/Results: Dg Abd 1 View  07/10/2011  *RADIOLOGY REPORT*  Clinical Data: Lower abdominal pain  ABDOMEN - 1 VIEW  Comparison: None.  Findings: Distended small bowel loops in the left upper quadrant are noted.  Moderate stool in the ascending and proximal transverse colon.  No pneumatosis.  No obvious free intraperitoneal gas.  Bony framework is unremarkable.  Unremarkable soft tissues.  Square radiodensity projects over the pelvis just to the right of midline of unknown significance and may be external to the patient.  IMPRESSION: Dilated small bowel loops in the left upper quadrant as described worrisome for a early or partial small bowel obstruction pattern.  Original Report Authenticated By: Donavan Burnet, M.D.   Ct  Abdomen Pelvis W Contrast  07/10/2011  *RADIOLOGY REPORT*  Clinical Data: Abdominal pain, nausea and vomiting.  CT ABDOMEN AND PELVIS WITH CONTRAST  Technique:  Multidetector CT imaging of the abdomen and pelvis was performed following the standard protocol during bolus administration of intravenous contrast.  Contrast: OMNIPAQUE IOHEXOL 300 MG/ML IV SOLN  Comparison: None.  Findings: There is evidence of small bowel obstruction with diffuse dilatation of the jejunum. There is a discrete transition point in the midline upper pelvis either at the level of the distal jejunum or proximal ileum.  No evidence of bowel perforation or focal abscess.  Associated small amount of free fluid is present in the pelvis and adjacent to the liver.  The proximal colon contains some stool. The distal colon is decompressed.  There is mild fatty infiltration of the liver.  The gallbladder is unremarkable.  The pancreas, spleen, adrenal glands and kidneys are within normal limits.  No masses or enlarged lymph nodes.  No hernias.  Bony structures are unremarkable.  No abnormal calcifications.  IMPRESSION: Small bowel obstruction with diffuse dilatation of the jejunum.  A discrete transition point in the midline upper pelvis is located either at the level of the distal jejunum or proximal ileum.  Original Report Authenticated By: Reola Calkins, M.D.    Anti-infectives: Anti-infectives    None     Current Facility-Administered Medications  Medication Dose Route Frequency Provider Last Rate Last Dose  . dextrose  5 % and 0.9 % NaCl with KCl 20 mEq/L infusion   Intravenous Continuous Sherrie George, PA 125 mL/hr at 07/12/11 0700    . diphenhydrAMINE (BENADRYL) injection 12.5-25 mg  12.5-25 mg Intravenous Q6H PRN Liz Malady, MD       Or  . diphenhydrAMINE (BENADRYL) 12.5 MG/5ML elixir 12.5-25 mg  12.5-25 mg Oral Q6H PRN Liz Malady, MD      . morphine 2 MG/ML injection 2-4 mg  2-4 mg Intravenous Q1H PRN  Liz Malady, MD   2 mg at 07/12/11 0723  . nicotine (NICODERM CQ - dosed in mg/24 hours) patch 14 mg  14 mg Transdermal Daily Liz Malady, MD   14 mg at 07/11/11 0942  . ondansetron (ZOFRAN) injection 4 mg  4 mg Intramuscular Once [place], [place]      . ondansetron [facility]) injection 4 mg  4 mg Intravenous Q6H PRN Liz Malady, MD   4 mg at 07/11/11 2231  . ondansetron (ZOFRAN-ODT) disintegrating tablet 8 mg  8 mg Oral Once Ethelda Chick, MD      . pantoprazole (PROTONIX) injection 40 mg  40 mg Intravenous QHS Liz Malady, MD   40 mg at 07/11/11 2150  . DISCONTD: dextrose 5 % and 0.9 % NaCl with KCl 20 mEq/L infusion   Intravenous Continuous Liz Malady, MD        Assessment/Plan  SBO Question of necrotizing enterocolitis as a premature new born, she has a small incision on her abd. PLAN:  Continue NG, and follow, try to get her to walk some today.  WBC better.  LOS: 2 days    Channon Brougher 07/12/2011

## 2011-07-13 ENCOUNTER — Inpatient Hospital Stay (HOSPITAL_COMMUNITY): Payer: Self-pay

## 2011-07-13 NOTE — Progress Notes (Signed)
  Subjective: No pain no nausea, passed a little gas but no BM ambulating some  Objective: Vital signs in last 24 hours: Temp:  [98.1 F (36.7 C)-99.7 F (37.6 C)] 99 F (37.2 C) (12/02 0649) Pulse Rate:  [76-97] 82  (12/02 0649) Resp:  [20] 20  (12/02 0649) BP: (110-120)/(68-82) 111/73 mmHg (12/02 0649) SpO2:  [96 %-100 %] 96 % (12/02 0649) Last BM Date: 07/09/11  Intake/Output from previous day:   Intake/Output this shift:    General appearance: alert and no distress Resp: clear to auscultation bilaterally GI: Abdomen is soft, not tender not distended and only rare BS. N/G greens. Can't tell I&O yet  Lab Results:   Basename 2011/08/03 0600 07/11/11 0553  WBC 14.0* 19.2*  HGB 13.5 15.0  HCT 41.4 43.3  PLT 250 299   BMET  Basename 2011/08/03 0600 07/11/11 0553  NA 141 138  K 3.9 3.4*  CL 110 103  CO2 28 22  GLUCOSE 110* 100*  BUN 8 10  CREATININE 0.67 0.54  CALCIUM 7.9* 8.3*   PT/INR No results found for this basename: LABPROT:2,INR:2 in the last 72 hours ABG No results found for this basename: PHART:2,PCO2:2,PO2:2,HCO3:2 in the last 72 hours  Studies/Results: Dg Abd 2 Views  08-03-2011  *RADIOLOGY REPORT*  Clinical Data: Small bowel obstruction  ABDOMEN - 2 VIEW  Comparison: CT 07/10/2011  IMPRESSION:  Findings: Interval placement of NG tube is in the stomach.  The dilated loops of small bowel are less distended than prior.  The oral contrast from prior exam has moved into the ascending colon.  No evidence of intra free peritoneal free air. 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  Impression:  Interval improvement in small bowel obstruction.  Original Report Authenticated By: Genevive Bi, M.D.    Anti-infectives: Anti-infectives    None      Assessment/Plan: SBO secondary to adhesions, clinically slightly better Plan: Will recheck xray today and decide next steps.   LOS: 3 days    Valoria Tamburri J 07/13/2011

## 2011-07-14 ENCOUNTER — Inpatient Hospital Stay (HOSPITAL_COMMUNITY): Payer: Self-pay

## 2011-07-14 LAB — COMPREHENSIVE METABOLIC PANEL
CO2: 24 mEq/L (ref 19–32)
Calcium: 8.4 mg/dL (ref 8.4–10.5)
Creatinine, Ser: 0.59 mg/dL (ref 0.50–1.10)
GFR calc Af Amer: >90 mL/min (ref >90)
GFR calc non Af Amer: >90 mL/min (ref >90)
Glucose, Bld: 103 mg/dL — ABNORMAL HIGH (ref 70–99)

## 2011-07-14 LAB — CBC
HCT: 40.1 % (ref 36.0–46.0)
Hemoglobin: 13.7 g/dL (ref 12.0–15.0)
MCH: 30.6 pg (ref 26.0–34.0)
MCV: 89.7 fL (ref 78.0–100.0)
RBC: 4.47 MIL/uL (ref 3.87–5.11)

## 2011-07-14 NOTE — Progress Notes (Signed)
Xray shows resolution of SBO D/C NGT and try clears Patient examined and I agree with the assessment and plan  Yesica Kemler E

## 2011-07-14 NOTE — Progress Notes (Signed)
Patient ID: Suzanne Lane, female   DOB: [birth year], 25 y.o.   MRN: [id number]    Subjective: Passing more gas, but no BM.  2400/NG I can't really tell how much of that is PO intake.  Objective: Vital signs in last 24 hours: Temp:  [99.7 F (37.6 C)-100 F (37.8 C)] 99.7 F (37.6 C) (12/03 0520) Pulse Rate:  [89-100] 100  (12/03 0520) Resp:  [16-18] 16  (12/03 0520) BP: (106-123)/(74-81) 106/74 mmHg (12/03 0520) SpO2:  [96 %-97 %] 97 % (12/03 0520) Last BM Date: 07/09/11  Intake/Output from previous day: 2023-08-11 0701 - 12/03 0700 In: 980 [I.V.:500] Out: 3100 [Urine:700; Emesis/NG output:2400] Intake/Output this shift:    General appearance: alert and no distress Resp: clear to auscultation bilaterally GI: Abdomen is soft, not tender not distended and only rare BS. N/G greens. Can't tell I&O yet  Lab Results:   Basename 07/14/11 0530 07/12/11 0600  WBC 15.6* 14.0*  HGB 13.7 13.5  HCT 40.1 41.4  PLT 238 250   BMET  Basename 07/14/11 0530 07/12/11 0600  NA 137 141  K 3.8 3.9  CL 106 110  CO2 24 28  GLUCOSE 103* 110*  BUN 7 8  CREATININE 0.59 0.67  CALCIUM 8.4 7.9*   PT/INR No results found for this basename: LABPROT:2,INR:2 in the last 72 hours ABG No results found for this basename: PHART:2,PCO2:2,PO2:2,HCO3:2 in the last 72 hours  Studies/Results: Dg Abd 2 Views  2011-08-11  *RADIOLOGY REPORT*  Clinical Data: Small bowel obstruction lower abdominal pain  ABDOMEN - 2 VIEW  Comparison: 07/12/2011  Findings: NG tube extends into the stomach.  Similar degree of small bowel dilatation measuring 4 cm in the left abdomen. Residual contrast in the colon and normal-appearing appendix.  Lung bases clear.  No free air.  IMPRESSION: Minimal change in SBO pattern.  Original Report Authenticated By: Judie Petit. Ruel Favors, M.D.   Dg Abd 2 Views  07/12/2011  *RADIOLOGY REPORT*  Clinical Data: Small bowel obstruction  ABDOMEN - 2 VIEW  Comparison: CT 07/10/2011  IMPRESSION:  Findings:  Interval placement of NG tube is in the stomach.  The dilated loops of small bowel are less distended than prior.  The oral contrast from prior exam has moved into the ascending colon.  No evidence of intra free peritoneal free air. 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  Impression:  Interval improvement in small bowel obstruction.  Original Report Authenticated By: Genevive Bi, M.D.    Anti-infectives: Anti-infectives    None      Assessment/Plan: SBO , No better, not really worse on exam.  Temp starting to trend up,WBC is hovering in the 14K-15.6 range down from 19K on admit. Will repeat 2view this AM   LOS: 4 days    Dai Apel 07/14/2011

## 2011-07-15 MED ORDER — PANTOPRAZOLE SODIUM 40 MG PO TBEC
40.0000 mg | DELAYED_RELEASE_TABLET | Freq: Every day | ORAL | Status: DC
  Administered 2011-07-15: 40 mg via ORAL
  Filled 2011-07-15: qty 1

## 2011-07-15 NOTE — Progress Notes (Signed)
Had another BM, anticipate D/C tomorrow Patient examined and I agree with the assessment and plan  Suzanne Lane E

## 2011-07-15 NOTE — Progress Notes (Signed)
Patient ID: Suzanne Lane, female   DOB: 1986/03/01, 25 y.o.   MRN: [id number] Patient ID: Suzanne Lane, female   DOB: 1986-06-29, 25 y.o.   MRN: [id number]    Subjective: Passing gas, had a BM last night. ON clear liquids.  Feels much better.     Objective: Vital signs in last 24 hours: Temp:  [97 F (36.1 C)-99.1 F (37.3 C)] 98 F (36.7 C) (12/04 0654) Pulse Rate:  [75-87] 75  (12/04 0654) Resp:  [18-19] 18  (12/04 0654) BP: (84-110)/(54-72) 84/54 mmHg (12/04 0654) SpO2:  [95 %-97 %] 95 % (12/04 0654) Weight:  [68.04 kg (150 lb)] 150 lb (68.04 kg) (12/04 0300) Last BM Date: 07/09/11  Intake/Output from previous day: 2023/08/05 0701 - 12/04 0700 In: 3470 [P.O.:480; I.V.:2750] Out: 800 [Urine:300; Emesis/NG output:500] Intake/Output this shift:   +BS +BM, feeling abd soft not distended.  Taking clears.  Lab Results:   Basename 08/05/11 0530  WBC 15.6*  HGB 13.7  HCT 40.1  PLT 238   BMET  Basename 2011-08-05 0530  NA 137  K 3.8  CL 106  CO2 24  GLUCOSE 103*  BUN 7  CREATININE 0.59  CALCIUM 8.4   PT/INR No results found for this basename: LABPROT:2,INR:2 in the last 72 hours ABG No results found for this basename: PHART:2,PCO2:2,PO2:2,HCO3:2 in the last 72 hours  Studies/Results: Dg Abd 2 Views  Aug 05, 2011  *RADIOLOGY REPORT*  Clinical Data: Small bowel obstruction.  Constipation.  Nausea. Left-sided pain.  ABDOMEN - 2 VIEW  Comparison: 1 day prior and CT of 07/10/2011  Findings: Upright supine films.  Upright film demonstrates  no free intraperitoneal air.  Scattered small bowel air fluid levels are improved since the prior.  Nasogastric terminates at the body of the stomach.  Supine film demonstrates improved small bowel dilatation.  A single loop of small bowel measures up to 3.4 cm within the left side of the abdomen.  Contrast within the colon.  Paucity of distal gas. The appendix is positioned within the high right abdomen.  IMPRESSION: Improved to nearly  resolved small bowel obstruction.  Original Report Authenticated By: Consuello Bossier, M.D.   Dg Abd 2 Views  07/13/2011  *RADIOLOGY REPORT*  Clinical Data: Small bowel obstruction lower abdominal pain  ABDOMEN - 2 VIEW  Comparison: 07/12/2011  Findings: NG tube extends into the stomach.  Similar degree of small bowel dilatation measuring 4 cm in the left abdomen. Residual contrast in the colon and normal-appearing appendix.  Lung bases clear.  No free air.  IMPRESSION: Minimal change in SBO pattern.  Original Report Authenticated By: Judie Petit. Ruel Favors, M.D.    Anti-infectives: Anti-infectives    None      Assessment/Plan: SBO , Resolving. Plan to advance diet today, recheck labs AM and possible home in AM.   LOS: 5 days    Suzanne Lane 07/15/2011

## 2011-07-16 DIAGNOSIS — K56600 Partial intestinal obstruction, unspecified as to cause: Secondary | ICD-10-CM | POA: Diagnosis present

## 2011-07-16 LAB — CBC
Hemoglobin: 13.2 g/dL (ref 12.0–15.0)
MCHC: 33.3 g/dL (ref 30.0–36.0)
Platelets: 284 10*3/uL (ref 150–400)
RBC: 4.46 MIL/uL (ref 3.87–5.11)

## 2011-07-16 NOTE — Progress Notes (Signed)
Agree Suzanne Lane  

## 2011-07-16 NOTE — Progress Notes (Signed)
  Subjective: Feels good, tol fulls diet. +BMs and flatus. Denies pain  Objective: Vital signs in last 24 hours: Temp:  [96.8 F (36 C)-98.9 F (37.2 C)] 97.8 F (36.6 C) (12/05 0639) Pulse Rate:  [59-96] 59  (12/05 0639) Resp:  [18] 18  (12/05 0639) BP: (88-111)/(62-71) 88/63 mmHg (12/05 0639) SpO2:  [96 %-98 %] 98 % (12/05 0639) Last BM Date: 07/15/11 (small one)  Intake/Output this shift:    Physical Exam: BP 88/63  Pulse 59  Temp(Src) 97.8 F (36.6 C) (Oral)  Resp 18  Ht 5\' 1"  (1.549 m)  Wt 150 lb (68.04 kg)  BMI 28.34 kg/m2  SpO2 98%  LMP 07/13/2011 Abdomen: soft, ND, NT  Labs: CBC  Basename 07/16/11 0530 2011/08/09 0530  WBC 9.5 15.6*  HGB 13.2 13.7  HCT 39.6 40.1  PLT 284 238   BMET  Basename 2011/08/09 0530  NA 137  K 3.8  CL 106  CO2 24  GLUCOSE 103*  BUN 7  CREATININE 0.59  CALCIUM 8.4   LFT  Basename 08-09-2011 0530  PROT 6.9  ALBUMIN 2.9*  AST 11  ALT 8  ALKPHOS 62  BILITOT 0.9  BILIDIR --  IBILI --  LIPASE --   PT/INR No results found for this basename: LABPROT:2,INR:2 in the last 72 hours ABG No results found for this basename: PHART:2,PCO2:2,PO2:2,HCO3:2 in the last 72 hours  Studies/Results: Dg Abd 2 Views  08-09-2011  *RADIOLOGY REPORT*  Clinical Data: Small bowel obstruction.  Constipation.  Nausea. Left-sided pain.  ABDOMEN - 2 VIEW  Comparison: 1 day prior and CT of 07/10/2011  Findings: Upright supine films.  Upright film demonstrates  no free intraperitoneal air.  Scattered small bowel air fluid levels are improved since the prior.  Nasogastric terminates at the body of the stomach.  Supine film demonstrates improved small bowel dilatation.  A single loop of small bowel measures up to 3.4 cm within the left side of the abdomen.  Contrast within the colon.  Paucity of distal gas. The appendix is positioned within the high right abdomen.  IMPRESSION: Improved to nearly resolved small bowel obstruction.  Original Report  Authenticated By: Consuello Bossier, M.D.    Assessment: Principal Problem:  *Partial small bowel obstruction Resolved Plan: DC IV DC home  LOS: 6 days    Suzanne Lane 07/16/2011

## 2011-07-16 NOTE — Discharge Summary (Signed)
Agree Josel Keo E  

## 2011-07-16 NOTE — Discharge Summary (Signed)
Physician Discharge Summary  Patient ID: Suzanne Lane MRN: [id number] DOB/AGE: 05-Sep-1985 25 y.o.  Admit date: 07/10/2011 Discharge date: 07/16/2011  Admission Diagnoses: Abd pain, N/V Discharge Diagnoses:  Partial small bowel obstruction, resolved.    Discharged Condition: good  Hospital Course: Pt is 25 yo female who presented with c/o abd pain and N/V. She is otherwise healthy but reports some type of abd surgery as a child. She had some xrays and CT that was suggestive of SBO and surgery was consulted by the ER. She was admitted for SBO possibly from adhesions. However, she improved fairly quickly. Her f/u x-rays showed the bowel pattern back to normal. She was started on diet after her bowel fxn returned. She tolerated advanced diet and we feel she is stable for DC.  Consults: none   Discharge Exam: Blood pressure 88/63, pulse 59, temperature 97.8 F (36.6 C), temperature source Oral, resp. rate 18, height 5\' 1"  (1.549 m), weight 150 lb (68.04 kg), last menstrual period 07/13/2011, SpO2 98.00%. Abdomen: soft, ND, NT, good BS  Disposition: Home  Discharge Orders    Future Orders Please Complete By Expires   Diet general      Comments:   Bland diet for several more days.   Increase activity slowly      Call MD for:  persistant nausea and vomiting      Call MD for:  severe uncontrolled pain        Current Discharge Medication List    CONTINUE these medications which have NOT CHANGED   Details  ibuprofen (ADVIL,MOTRIN) 600 MG tablet Take 600 mg by mouth every 6 (six) hours as needed. For pain        Follow-up Information    Follow up with Primary provider. (As needed if symptoms worsen)          SignedMarianna Fuss 07/16/2011, 9:34 AM

## 2011-09-20 ENCOUNTER — Encounter (HOSPITAL_COMMUNITY): Payer: Self-pay | Admitting: *Deleted

## 2011-09-20 ENCOUNTER — Emergency Department (HOSPITAL_COMMUNITY)
Admission: EM | Admit: 2011-09-20 | Discharge: 2011-09-20 | Disposition: A | Discharge status: Home / Self Care | Payer: Self-pay | Attending: Emergency Medicine | Admitting: Emergency Medicine

## 2011-09-20 DIAGNOSIS — K2970 Gastritis, unspecified, without bleeding: Secondary | ICD-10-CM | POA: Insufficient documentation

## 2011-09-20 DIAGNOSIS — K56609 Unspecified intestinal obstruction, unspecified as to partial versus complete obstruction: Secondary | ICD-10-CM | POA: Insufficient documentation

## 2011-09-20 DIAGNOSIS — K2990 Gastroduodenitis, unspecified, without bleeding: Secondary | ICD-10-CM | POA: Insufficient documentation

## 2011-09-20 DIAGNOSIS — R1013 Epigastric pain: Secondary | ICD-10-CM | POA: Insufficient documentation

## 2011-09-20 DIAGNOSIS — F17200 Nicotine dependence, unspecified, uncomplicated: Secondary | ICD-10-CM | POA: Insufficient documentation

## 2011-09-20 HISTORY — DX: Unspecified intestinal obstruction, unspecified as to partial versus complete obstruction: K56.609

## 2011-09-20 LAB — URINALYSIS, ROUTINE W REFLEX MICROSCOPIC
Glucose, UA: NEGATIVE mg/dL
Hgb urine dipstick: NEGATIVE
Specific Gravity, Urine: 1.021 (ref 1.005–1.030)
Urobilinogen, UA: 1 mg/dL (ref 0.0–1.0)

## 2011-09-20 LAB — POCT PREGNANCY, URINE: Preg Test, Ur: NEGATIVE

## 2011-09-20 LAB — URINE MICROSCOPIC-ADD ON

## 2011-09-20 MED ORDER — PANTOPRAZOLE SODIUM 40 MG PO TBEC: 40.0000 mg | DELAYED_RELEASE_TABLET | Freq: Once | ORAL | Status: DC

## 2011-09-20 MED ORDER — LANSOPRAZOLE 15 MG PO CPDR: 15.0000 mg | DELAYED_RELEASE_CAPSULE | Freq: Every day | ORAL | Status: DC

## 2011-09-20 MED ORDER — GI COCKTAIL ~~LOC~~
30.0000 mL | Freq: Once | ORAL | Status: AC
  Administered 2011-09-20: 30 mL via ORAL
  Filled 2011-09-20: qty 30

## 2011-09-20 MED ORDER — PANTOPRAZOLE SODIUM 40 MG PO TBEC
40.0000 mg | DELAYED_RELEASE_TABLET | Freq: Once | ORAL | Status: AC
  Administered 2011-09-20: 40 mg via ORAL
  Filled 2011-09-20: qty 1

## 2011-09-20 MED ORDER — GI COCKTAIL ~~LOC~~: 30.0000 mL | Freq: Once | ORAL | Status: DC

## 2011-09-20 NOTE — ED Provider Notes (Signed)
History     CSN: [id number]  Arrival date & time 09/20/11  0230   First MD Initiated Contact with Patient 09/20/11 [phone]      Chief Complaint  Patient presents with  . Abdominal Pain    (Consider location/radiation/quality/duration/timing/severity/associated sxs/prior treatment) HPI This is a 26 year old white female with a history of an unspecified abdominal surgery when she was an infant. She complains of a month or longer history of intermittent, sharp pain in her left epigastric area. It has been mild to moderate but was more severe this morning, hence her visit to the ED. There is no associated nausea, vomiting or diarrhea. She has had some recent constipation for which he took a stool softener. She has a subjective sensation of the left side of her epigastrium is more distended than the right side. The pain is not worse at night.  Past Medical History  Diagnosis Date  . Small bowel obstruction     Past Surgical History  Procedure Date  . Abdominal surgery     History reviewed. No pertinent family history.  History  Substance Use Topics  . Smoking status: Current Everyday Smoker  . Smokeless tobacco: Not on file  . Alcohol Use: Yes    OB History    Grav Para Term Preterm Abortions TAB SAB Ect Mult Living                  Review of Systems  All other systems reviewed and are negative.    Allergies  Penicillins  Home Medications  No current outpatient prescriptions on file.  BP 112/82  Pulse 59  Temp(Src) 97.7 F (36.5 C) (Oral)  Resp 17  SpO2 96%  LMP 09/15/2011  Physical Exam General: Well-developed, well-nourished female in no acute distress; appearance consistent with age of record HENT: normocephalic, atraumatic Eyes: Normal appearance Neck: supple Heart: regular rate and rhythm Lungs: Normal respiratory effort and excursion Abdomen: soft; nondistended; mild left epigastric tenderness; no masses or hepatosplenomegaly; bowel sounds present;  old well-healed surgical scars Extremities: No deformity; full range of motion Neurologic: Awake, alert and oriented; motor function intact in all extremities and symmetric; no facial droop Skin: Warm and dry Psychiatric: Normal mood and affect    ED Course  Procedures (including critical care time)     MDM  4:24 AM Improvement with GI cocktail.  Nursing notes and vitals signs, including pulse oximetry, reviewed.  Summary of this visit's results, reviewed by myself:  Labs:  Results for orders placed during the hospital encounter of 09/20/11  URINALYSIS, ROUTINE W REFLEX MICROSCOPIC      Component Value Range   Color, Urine YELLOW  YELLOW    APPearance CLOUDY (*) CLEAR    Specific Gravity, Urine 1.021  1.005 - 1.030    pH 7.0  5.0 - 8.0    Glucose, UA NEGATIVE  NEGATIVE (mg/dL)   Hgb urine dipstick NEGATIVE  NEGATIVE    Bilirubin Urine NEGATIVE  NEGATIVE    Ketones, ur NEGATIVE  NEGATIVE (mg/dL)   Protein, ur NEGATIVE  NEGATIVE (mg/dL)   Urobilinogen, UA 1.0  0.0 - 1.0 (mg/dL)   Nitrite NEGATIVE  NEGATIVE    Leukocytes, UA SMALL (*) NEGATIVE   POCT PREGNANCY, URINE      Component Value Range   Preg Test, Ur NEGATIVE  NEGATIVE   URINE MICROSCOPIC-ADD ON      Component Value Range   Squamous Epithelial / LPF MANY (*) RARE    WBC, UA 3-6  <  3 (WBC/hpf)   Bacteria, UA MANY (*) RARE            Hanley Seamen, MD 09/20/11 [phone]

## 2011-09-20 NOTE — ED Notes (Signed)
C/o pain to LUQ of abd and periumbilicus; denies urinary symptoms, no n/v/d; endorses white vaginal d/c with no odor; states previous adm for SBO 07/10/11

## 2011-09-20 NOTE — ED Notes (Signed)
C/o epigastric pain for  Months worse for the past few days.  No nv or diarrhea.  lmp   Feb 4th

## 2013-02-28 ENCOUNTER — Encounter (HOSPITAL_COMMUNITY): Payer: Self-pay

## 2013-02-28 ENCOUNTER — Emergency Department (INDEPENDENT_AMBULATORY_CARE_PROVIDER_SITE_OTHER)
Admission: EM | Admit: 2013-02-28 | Discharge: 2013-02-28 | Disposition: A | Discharge status: Home / Self Care | Payer: Self-pay | Source: Home / Self Care | Attending: Family Medicine | Admitting: Family Medicine

## 2013-02-28 DIAGNOSIS — K047 Periapical abscess without sinus: Secondary | ICD-10-CM

## 2013-02-28 MED ORDER — DICLOFENAC POTASSIUM 50 MG PO TABS: 50.0000 mg | ORAL_TABLET | Freq: Three times a day (TID) | ORAL | Status: DC

## 2013-02-28 MED ORDER — CLINDAMYCIN HCL 150 MG PO CAPS: 150.0000 mg | ORAL_CAPSULE | Freq: Four times a day (QID) | ORAL | Status: DC

## 2013-02-28 NOTE — ED Notes (Signed)
Reportedly has been having a problem w her teeth for some time, but worse past few days

## 2013-02-28 NOTE — ED Provider Notes (Signed)
   History    CSN: [id number] Arrival date & time 02/28/13  1652  First MD Initiated Contact with Patient 02/28/13 1735     Chief Complaint  Patient presents with  . Dental Problem   (Consider location/radiation/quality/duration/timing/severity/associated sxs/prior Treatment) Patient is a 27 y.o. female presenting with tooth pain. The history is provided by the patient.  Dental Pain Location:  Lower Lower teeth location:  30/RL 1st molar and 31/RL 2nd molar Quality:  Throbbing Severity:  Moderate Chronicity:  New Context: dental caries    Past Medical History  Diagnosis Date  . Small bowel obstruction    Past Surgical History  Procedure Laterality Date  . Abdominal surgery     History reviewed. No pertinent family history. History  Substance Use Topics  . Smoking status: Current Every Day Smoker  . Smokeless tobacco: Not on file  . Alcohol Use: Yes   OB History   Grav Para Term Preterm Abortions TAB SAB Ect Mult Living                 Review of Systems  Constitutional: Negative.   HENT: Positive for dental problem.     Allergies  Penicillins  Home Medications   Current Outpatient Rx  Name  Route  Sig  Dispense  Refill  . clindamycin (CLEOCIN) 150 MG capsule   Oral   Take 1 capsule (150 mg total) by mouth 4 (four) times daily.   28 capsule   0   . diclofenac (CATAFLAM) 50 MG tablet   Oral   Take 1 tablet (50 mg total) by mouth 3 (three) times daily.   15 tablet   0   . EXPIRED: lansoprazole (PREVACID 24HR) 15 MG capsule   Oral   Take 1 capsule (15 mg total) by mouth daily.          BP 125/96  Pulse 80  Temp(Src) 98.1 F (36.7 C) (Oral)  Resp 16  SpO2 100% Physical Exam  Nursing note and vitals reviewed. Constitutional: She appears well-developed and well-nourished.  HENT:  Mouth/Throat: Oropharynx is clear and moist. Abnormal dentition. Dental abscesses and dental caries present.      ED Course  Procedures (including critical care  time) Labs Reviewed - No data to display No results found. No diagnosis found.  MDM    Linna Hoff, MD 03/01/13 [phone]

## 2013-07-18 ENCOUNTER — Emergency Department (HOSPITAL_COMMUNITY)
Admission: EM | Admit: 2013-07-18 | Discharge: 2013-07-18 | Disposition: A | Discharge status: Home / Self Care | Payer: Medicaid Other | Attending: Emergency Medicine | Admitting: Emergency Medicine

## 2013-07-18 ENCOUNTER — Encounter (HOSPITAL_COMMUNITY): Payer: Self-pay | Admitting: Emergency Medicine

## 2013-07-18 DIAGNOSIS — K089 Disorder of teeth and supporting structures, unspecified: Secondary | ICD-10-CM | POA: Insufficient documentation

## 2013-07-18 DIAGNOSIS — Z8719 Personal history of other diseases of the digestive system: Secondary | ICD-10-CM | POA: Insufficient documentation

## 2013-07-18 DIAGNOSIS — F17200 Nicotine dependence, unspecified, uncomplicated: Secondary | ICD-10-CM | POA: Insufficient documentation

## 2013-07-18 DIAGNOSIS — K029 Dental caries, unspecified: Secondary | ICD-10-CM | POA: Insufficient documentation

## 2013-07-18 DIAGNOSIS — Z792 Long term (current) use of antibiotics: Secondary | ICD-10-CM | POA: Insufficient documentation

## 2013-07-18 DIAGNOSIS — Z880 Allergy status to penicillin: Secondary | ICD-10-CM | POA: Insufficient documentation

## 2013-07-18 MED ORDER — KETOROLAC TROMETHAMINE 60 MG/2ML IM SOLN
60.0000 mg | Freq: Once | INTRAMUSCULAR | Status: AC
  Administered 2013-07-18: 60 mg via INTRAMUSCULAR
  Filled 2013-07-18: qty 2

## 2013-07-18 MED ORDER — HYDROCODONE-ACETAMINOPHEN 5-325 MG PO TABS: 1.0000 | ORAL_TABLET | Freq: Four times a day (QID) | ORAL | Status: DC | PRN

## 2013-07-18 NOTE — ED Notes (Signed)
Pt is here with toothache to LL mouth and has been treated with po anbx and no better.  Pt feels like it is going down side of neck.

## 2013-07-18 NOTE — ED Provider Notes (Signed)
CSN: [id number]     Arrival date & time 07/18/13  1335 History   First MD Initiated Contact with Patient 07/18/13 1607     Chief Complaint  Patient presents with  . Dental Pain   (Consider location/radiation/quality/duration/timing/severity/associated sxs/prior Treatment) Patient is a 27 y.o. female presenting with tooth pain. The history is provided by the patient and a relative. No language interpreter was used.  Dental Pain Location:  Upper and lower Upper teeth location:  2/RU 2nd molar Lower teeth location:  32/RL 3rd molar Quality:  Dull and aching Severity:  Severe Onset quality:  Gradual Timing:  Intermittent Progression:  Worsening Chronicity:  Recurrent Context: dental fracture and poor dentition   Relieved by:  Nothing Worsened by:  Nothing tried Ineffective treatments:  NSAIDs (kelfex) Associated symptoms: facial pain   Associated symptoms: no congestion, no difficulty swallowing, no drooling, no facial swelling, no fever, no gum swelling, no headaches, no neck pain, no neck swelling, no oral bleeding, no oral lesions and no trismus   Risk factors: periodontal disease     Past Medical History  Diagnosis Date  . Small bowel obstruction    Past Surgical History  Procedure Laterality Date  . Abdominal surgery     No family history on file. History  Substance Use Topics  . Smoking status: Current Every Day Smoker  . Smokeless tobacco: Not on file  . Alcohol Use: Yes   OB History   Grav Para Term Preterm Abortions TAB SAB Ect Mult Living                 Review of Systems  Constitutional: Negative for fever, chills, diaphoresis, activity change, appetite change and fatigue.  HENT: Negative for congestion, drooling, facial swelling, mouth sores, rhinorrhea and sore throat.   Eyes: Negative for photophobia and discharge.  Respiratory: Negative for cough, chest tightness and shortness of breath.   Cardiovascular: Negative for chest pain, palpitations and leg  swelling.  Gastrointestinal: Negative for nausea, vomiting, abdominal pain and diarrhea.  Endocrine: Negative for polydipsia and polyuria.  Genitourinary: Negative for dysuria, frequency, difficulty urinating and pelvic pain.  Musculoskeletal: Negative for arthralgias, back pain, neck pain and neck stiffness.  Skin: Negative for color change and wound.  Allergic/Immunologic: Negative for immunocompromised state.  Neurological: Negative for facial asymmetry, weakness, numbness and headaches.  Hematological: Does not bruise/bleed easily.  Psychiatric/Behavioral: Negative for confusion and agitation.    Allergies  Penicillins  Home Medications   Current Outpatient Rx  Name  Route  Sig  Dispense  Refill  . cephALEXin (KEFLEX) 500 MG capsule   Oral   Take 500 mg by mouth 4 (four) times daily.         Marland Kitchen ibuprofen (ADVIL,MOTRIN) 200 MG tablet   Oral   Take 200 mg by mouth every 6 (six) hours as needed for mild pain.         Marland Kitchen HYDROcodone-acetaminophen (NORCO) 5-325 MG per tablet   Oral   Take 1 tablet by mouth every 6 (six) hours as needed.   10 tablet   0    BP 142/84  Pulse 52  Temp(Src) 98.2 F (36.8 C) (Oral)  Resp 20  SpO2 97% Physical Exam  Constitutional: She is oriented to person, place, and time. She appears well-developed and well-nourished. No distress.  HENT:  Head: Normocephalic and atraumatic.  Mouth/Throat: Oropharynx is clear and moist. No trismus in the jaw. Abnormal dentition. Dental caries present. No dental abscesses or uvula swelling. No  oropharyngeal exudate, posterior oropharyngeal edema, posterior oropharyngeal erythema or tonsillar abscesses.    Eyes: Pupils are equal, round, and reactive to light.  Neck: Normal range of motion. Neck supple.  Cardiovascular: Normal rate, regular rhythm and normal heart sounds.  Exam reveals no gallop and no friction rub.   No murmur heard. Pulmonary/Chest: Effort normal and breath sounds normal. No respiratory  distress. She has no wheezes. She has no rales.  Abdominal: Soft. Bowel sounds are normal. She exhibits no distension and no mass. There is no tenderness. There is no rebound and no guarding.  Musculoskeletal: Normal range of motion. She exhibits no edema and no tenderness.  Neurological: She is alert and oriented to person, place, and time.  Skin: Skin is warm and dry.  Psychiatric: She has a normal mood and affect.    ED Course  Procedures (including critical care time) Labs Review Labs Reviewed - No data to display Imaging Review No results found.  EKG Interpretation   None       MDM   1. Pain due to dental caries    Pt is a 27 y.o. female with Pmhx as above who presents with several months of worsening dental pain of a RU and RL molar.  She saw a dentist about 5 days ago & was put on kelfex and asked to f/u with an oral surgeon for extractions which she has been unable to do due to cost.  No fever, trismus, facial swelling, difficulty swallowing.  On PE, VSS, pt in NAD, though uncomfortable.  Poor dentition throughout, but no localized purulence, drainage, no tenderness over affected teeth.  Will d/c home w/ dental resources and have explained that definitive treatment will be extraction.  She will continue her keflex, short course of norco given.   Return precautions given for new or worsening symptoms including fever, facial swelling, difficulty swallowing.          Shanna Cisco, MD 07/18/13 [phone]

## 2013-07-20 ENCOUNTER — Telehealth (HOSPITAL_COMMUNITY): Payer: Self-pay

## 2013-07-20 NOTE — ED Notes (Signed)
Call from on call dentist requesting demographics and referral be faxed to their office. 

## 2013-07-29 ENCOUNTER — Encounter (HOSPITAL_COMMUNITY): Payer: Self-pay | Admitting: Emergency Medicine

## 2013-07-29 ENCOUNTER — Emergency Department (INDEPENDENT_AMBULATORY_CARE_PROVIDER_SITE_OTHER)
Admission: EM | Admit: 2013-07-29 | Discharge: 2013-07-29 | Disposition: A | Discharge status: Home / Self Care | Payer: Self-pay | Source: Home / Self Care

## 2013-07-29 DIAGNOSIS — K089 Disorder of teeth and supporting structures, unspecified: Secondary | ICD-10-CM

## 2013-07-29 DIAGNOSIS — K0889 Other specified disorders of teeth and supporting structures: Secondary | ICD-10-CM

## 2013-07-29 DIAGNOSIS — K044 Acute apical periodontitis of pulpal origin: Secondary | ICD-10-CM

## 2013-07-29 DIAGNOSIS — K047 Periapical abscess without sinus: Secondary | ICD-10-CM

## 2013-07-29 MED ORDER — HYDROCODONE-ACETAMINOPHEN 5-325 MG PO TABS: 1.0000 | ORAL_TABLET | ORAL | Status: DC | PRN

## 2013-07-29 MED ORDER — CEPHALEXIN 250 MG PO CAPS: 250.0000 mg | ORAL_CAPSULE | Freq: Four times a day (QID) | ORAL | Status: DC

## 2013-07-29 NOTE — ED Provider Notes (Signed)
CSN: [id number]     Arrival date & time 07/29/13  1200 History   First MD Initiated Contact with Patient 07/29/13 1432     Chief Complaint  Patient presents with  . Dental Pain   (Consider location/radiation/quality/duration/timing/severity/associated sxs/prior Treatment) HPI Comments: 27 year old female complaining of dental pain in the right lower third molar. Since yesterday is associated with moderate edema to the face and adjacent to the tooth. His been to the emergency department 2-3 times for dental pain last week she had a left molar extracted.   Past Medical History  Diagnosis Date  . Small bowel obstruction    Past Surgical History  Procedure Laterality Date  . Abdominal surgery     History reviewed. No pertinent family history. History  Substance Use Topics  . Smoking status: Current Every Day Smoker  . Smokeless tobacco: Not on file  . Alcohol Use: Yes   OB History   Grav Para Term Preterm Abortions TAB SAB Ect Mult Living                 Review of Systems  Constitutional: Negative for fever.  HENT: Positive for dental problem.   All other systems reviewed and are negative.    Allergies  Penicillins  Home Medications   Current Outpatient Rx  Name  Route  Sig  Dispense  Refill  . cephALEXin (KEFLEX) 250 MG capsule   Oral   Take 1 capsule (250 mg total) by mouth 4 (four) times daily.   28 capsule   0   . HYDROcodone-acetaminophen (NORCO/VICODIN) 5-325 MG per tablet   Oral   Take 1 tablet by mouth every 4 (four) hours as needed.   15 tablet   0   . ibuprofen (ADVIL,MOTRIN) 200 MG tablet   Oral   Take 200 mg by mouth every 6 (six) hours as needed for mild pain.          LMP 07/07/2013 Physical Exam  Nursing note and vitals reviewed. Constitutional: She is oriented to person, place, and time. She appears well-developed and well-nourished. No distress.  HENT:  Mouth/Throat: Oropharynx is clear and moist. No oropharyngeal exudate.  Eyes:  Conjunctivae and EOM are normal.  Neck: Normal range of motion. Neck supple.  Cardiovascular: Normal rate.   Lymphadenopathy:    She has no cervical adenopathy.  Neurological: She is alert and oriented to person, place, and time.  Skin: Skin is warm and dry.  Psychiatric: She has a normal mood and affect.    ED Course  Procedures (including critical care time) Labs Review Labs Reviewed - No data to display Imaging Review No results found.    MDM   1. Pain, dental   2. Dental infection      Keflex 500 qid norco 5mg  #15 Must see dentist  Hayden Rasmussen, NP 07/29/13 1448

## 2013-07-29 NOTE — ED Notes (Signed)
C/o right dental pain  States she got her left wisdom tooth out but never received antibiotic or pain med Ibuprofen and peroxide wash was used as treatment

## 2013-07-29 NOTE — Discharge Instructions (Signed)
Abscessed Tooth °An abscessed tooth is an infection around your tooth. It may be caused by holes or damage to the tooth (cavity) or a dental disease. An abscessed tooth causes mild to very bad pain in and around the tooth. See your dentist right away if you have tooth or gum pain. °HOME CARE °· Take your medicine as told. Finish it even if you start to feel better. °· Do not drive after taking pain medicine. °· Rinse your mouth (gargle) often with salt water (¼ teaspoon salt in 8 ounces of warm water). °· Do not apply heat to the outside of your face. °GET HELP RIGHT AWAY IF:  °· You have a temperature by mouth above 102° F (38.9° C), not controlled by medicine. °· You have chills and a very bad headache. °· You have problems breathing or swallowing. °· Your mouth will not open. °· You develop puffiness (swelling) on the neck or around the eye. °· Your pain is not helped by medicine. °· Your pain is getting worse instead of better. °MAKE SURE YOU:  °· Understand these instructions. °· Will watch your condition. °· Will get help right away if you are not doing well or get worse. °Document Released: 01/14/2008 Document Revised: 10/20/2011 Document Reviewed: 11/05/2010 °Exit[employer]® Patient Information ©2014 [employer]. ° °Dental Pain °A tooth ache may be caused by cavities (tooth decay). Cavities expose the nerve of the tooth to air and hot or cold temperatures. It may come from an infection or abscess (also called a boil or furuncle) around your tooth. It is also often caused by dental caries (tooth decay). This causes the pain you are having. °DIAGNOSIS  °Your caregiver can diagnose this problem by exam. °TREATMENT  °· If caused by an infection, it may be treated with medications which kill germs (antibiotics) and pain medications as prescribed by your caregiver. Take medications as directed. °· Only take over-the-counter or prescription medicines for pain, discomfort, or fever as directed by your  caregiver. °· Whether the tooth ache today is caused by infection or dental disease, you should see your dentist as soon as possible for further care. °SEEK MEDICAL CARE IF: °The exam and treatment you received today has been provided on an emergency basis only. This is not a substitute for complete medical or dental care. If your problem worsens or new problems (symptoms) appear, and you are unable to meet with your dentist, call or return to this location. °SEEK IMMEDIATE MEDICAL CARE IF:  °· You have a fever. °· You develop redness and swelling of your face, jaw, or neck. °· You are unable to open your mouth. °· You have severe pain uncontrolled by pain medicine. °MAKE SURE YOU:  °· Understand these instructions. °· Will watch your condition. °· Will get help right away if you are not doing well or get worse. °Document Released: 07/28/2005 Document Revised: 10/20/2011 Document Reviewed: 03/15/2008 °Exit[employer]® Patient Information ©2014 [employer]. ° °Dental Care and Dentist Visits °Dental care supports good overall health. Regular dental visits can also help you avoid dental pain, bleeding, infection, and other more serious health problems in the future. It is important to keep the mouth healthy because diseases in the teeth, gums, and other oral tissues can spread to other areas of the body. Some problems, such as diabetes, heart disease, and pre-term labor have been associated with poor oral health.  °See your dentist every 6 months. If you experience emergency problems such as a toothache or broken tooth, go   to the dentist right away. If you see your dentist regularly, you may catch problems early. It is easier to be treated for problems in the early stages.  °WHAT TO EXPECT AT A DENTIST VISIT  °Your dentist will look for many common oral health problems and recommend proper treatment. At your regular dental visit, you can expect: °· Gentle cleaning of the teeth and gums. This includes scraping and polishing.  This helps to remove the sticky substance around the teeth and gums (plaque). Plaque forms in the mouth shortly after eating. Over time, plaque hardens on the teeth as tartar. If tartar is not removed regularly, it can cause problems. Cleaning also helps remove stains. °· Periodic X-rays. These pictures of the teeth and supporting bone will help your dentist assess the health of your teeth. °· Periodic fluoride treatments. Fluoride is a natural mineral shown to help strengthen teeth. Fluoride treatment involves applying a fluoride gel or varnish to the teeth. It is most commonly done in children. °· Examination of the mouth, tongue, jaws, teeth, and gums to look for any oral health problems, such as: °· Cavities (dental caries). This is decay on the tooth caused by plaque, sugar, and acid in the mouth. It is best to catch a cavity when it is small. °· Inflammation of the gums caused by plaque buildup (gingivitis). °· Problems with the mouth or malformed or misaligned teeth. °· Oral cancer or other diseases of the soft tissues or jaws.  °KEEP YOUR TEETH AND GUMS HEALTHY °For healthy teeth and gums, follow these general guidelines as well as your dentist's specific advice: °· Have your teeth professionally cleaned at the dentist every 6 months. °· Brush twice daily with a fluoride toothpaste. °· Floss your teeth daily.  °· Ask your dentist if you need fluoride supplements, treatments, or fluoride toothpaste. °· Eat a healthy diet. Reduce foods and drinks with added sugar. °· Avoid smoking. °TREATMENT FOR ORAL HEALTH PROBLEMS °If you have oral health problems, treatment varies depending on the conditions present in your teeth and gums. °· Your caregiver will most likely recommend good oral hygiene at each visit. °· For cavities, gingivitis, or other oral health disease, your caregiver will perform a procedure to treat the problem. This is typically done at a separate appointment. Sometimes your caregiver will refer you  to another dental specialist for specific tooth problems or for surgery. °SEEK IMMEDIATE DENTAL CARE IF: °· You have pain, bleeding, or soreness in the gum, tooth, jaw, or mouth area. °· A permanent tooth becomes loose or separated from the gum socket. °· You experience a blow or injury to the mouth or jaw area. °Document Released: 04/09/2011 Document Revised: 10/20/2011 Document Reviewed: 04/09/2011 °ExitCare® Patient Information ©2014 [employer]. ° °

## 2013-07-29 NOTE — ED Provider Notes (Signed)
Medical screening examination/treatment/procedure(s) were performed by resident physician or non-physician practitioner and as supervising physician I was immediately available for consultation/collaboration.   Barkley Bruns MD.   Linna Hoff, MD 07/29/13 [phone]

## 2013-08-11 DIAGNOSIS — O24419 Gestational diabetes mellitus in pregnancy, unspecified control: Secondary | ICD-10-CM

## 2013-08-11 HISTORY — DX: Gestational diabetes mellitus in pregnancy, unspecified control: O24.419

## 2013-08-11 NOTE — L&D Delivery Note (Signed)
Delivery Note At 2:01 PM a viable female was delivered via  (Presentation: LOA ;  ).  APGAR: 3, 8; weight 6lb 10.9oz.   Placenta status: itnact, 1 loose nuchal . Cord: 3 vessels with no complications.    Anesthesia: Epidural  Episiotomy: None Lacerations: Small abrasions Suture Repair: NA Est. Blood Loss (mL): 250cc  Mom to postpartum.  Baby to Couplet care / Skin to Skin then to warmer.  Rodrigo RanDorsey, Crystal S 03/30/2014, 2:24 PM Evaluation and management procedures were performed by Resident physician under my supervision/collaboration. Chart reviewed, patient examined by me and I agree with management and plan. Joanna Puffrystal S Dorsey, MD 03/31/2014 6:46 AM

## 2013-08-16 ENCOUNTER — Encounter (HOSPITAL_COMMUNITY): Payer: Self-pay | Admitting: Emergency Medicine

## 2013-08-16 ENCOUNTER — Emergency Department (INDEPENDENT_AMBULATORY_CARE_PROVIDER_SITE_OTHER)
Admission: EM | Admit: 2013-08-16 | Discharge: 2013-08-16 | Disposition: A | Discharge status: Home / Self Care | Payer: Medicaid Other | Source: Home / Self Care | Attending: Family Medicine | Admitting: Family Medicine

## 2013-08-16 DIAGNOSIS — N912 Amenorrhea, unspecified: Secondary | ICD-10-CM

## 2013-08-16 DIAGNOSIS — Z3201 Encounter for pregnancy test, result positive: Secondary | ICD-10-CM

## 2013-08-16 DIAGNOSIS — Z3401 Encounter for supervision of normal first pregnancy, first trimester: Secondary | ICD-10-CM

## 2013-08-16 LAB — POCT PREGNANCY, URINE: Preg Test, Ur: POSITIVE — AB

## 2013-08-16 NOTE — ED Notes (Signed)
States that she took a home pregnancy test last night and wants to make sure she is pregnant. Stated that she has had nausea, breast tenderness, mood swings,stomach pains, and frequent urination that started a few weeks ago. Written by: Marga MelnickQuaNeisha Jones, SMA

## 2013-08-16 NOTE — ED Provider Notes (Signed)
Clelia SchaumannSerita Zonia KiefStephens is a 28 y.o. female who presents to Urgent Care today for pregnancy test. Patient has been having unprotected sex with the intent of possibly becoming pregnant. Her last menstrual period was December 3. She had a positive home pregnancy test yesterday. She is currently asymptomatic.   Past Medical History  Diagnosis Date  . Small bowel obstruction    History  Substance Use Topics  . Smoking status: Current Every Day Smoker  . Smokeless tobacco: Not on file  . Alcohol Use: Yes   ROS as above Medications reviewed. No current facility-administered medications for this encounter.   No current outpatient prescriptions on file.    Exam:  BP 123/73  Pulse 70  Temp(Src) 98.3 F (36.8 C) (Oral)  Resp 18  SpO2 100%  LMP 07/13/2013 Gen: Well NAD Lungs: Normal work of breathing. CTABL Heart: RRR no MRG Abd: NABS, Soft. NT, ND Exts: , warm and well perfused.   Results for orders placed during the hospital encounter of 08/16/13 (from the past 24 hour(s))  POCT PREGNANCY, URINE     Status: Abnormal   Collection Time    08/16/13 11:32 AM      Result Value Range   Preg Test, Ur POSITIVE (*) NEGATIVE   No results found.  Assessment and Plan: 28 y.o. female with positive pregnancy test. Recommend starting prenatal vitamins. Letter written for pregnancy Medicaid. List of OB/GYN providers provided. Discussed warning signs or symptoms. Please see discharge instructions. Patient expresses understanding.      Rodolph BongEvan S Rajan Burgard, MD 08/16/13 [phone]

## 2013-08-16 NOTE — Discharge Instructions (Signed)
Thank you for coming in today. Start taking over-the-counter prenatal vitamins.  Go to Department of Social Services and get pregnancy Medicaid.  you can take Tylenol for pain.   [facility] [address] [place], [place] [phone]  [facility] [address] [place], [place] [phone]  [facility]: Silverio Layivard Sandra MD [address] [place], [place] [phone]  [facility] [address] [place], [place] [phone]  [facility]: Marcelle OverlieGrewal Michelle MD [address] [place], [place] 601 019 [phone]  [facility]: Tracey HarriesHenley Thomas F MD [address] [place], [place] [phone]  [facility] [address] [place], [place] [phone]   ABCs of Pregnancy A Antepartum care is very important. Be sure you see your doctor and get prenatal care as soon as you think you are pregnant. At this time, you will be tested for infection, genetic abnormalities and potential problems with you and the pregnancy. This is the time to discuss diet, exercise, work, medications, labor, pain medication during labor and the possibility of a cesarean delivery. Ask any questions that may concern you. It is important to see your doctor regularly throughout your pregnancy. Avoid exposure to toxic substances and chemicals - such as cleaning solvents, lead and mercury, some insecticides, and paint. Pregnant women should avoid exposure to paint fumes, and fumes that cause you to feel ill, dizzy or faint. When possible, it is a good idea to have a pre-pregnancy consultation with your caregiver to begin some important recommendations your caregiver suggests such as, taking folic acid, exercising, quitting smoking, avoiding alcoholic beverages, etc. B Breastfeeding is the healthiest choice for both you and your baby. It has many nutritional benefits  for the baby and health benefits for the mother. It also creates a very tight and loving bond between the baby and mother. Talk to your doctor, your family and friends, and your employer about how you choose to feed your baby and how they can support you in your decision. Not all birth defects can be prevented, but a woman can take actions that may increase her chance of having a healthy baby. Many birth defects happen very early in pregnancy, sometimes before a woman even knows she is pregnant. Birth defects or abnormalities of any child in your or the father's family should be discussed with your caregiver. Get a good support bra as your breast size changes. Wear it especially when you exercise and when nursing.  C Celebrate the news of your pregnancy with the your spouse/father and family. Childbirth classes are helpful to take for you and the spouse/father because it helps to understand what happens during the pregnancy, labor and delivery. Cesarean delivery should be discussed with your doctor so you are prepared for that possibility. The pros and cons of circumcision if it is a boy, should be discussed with your pediatrician. Cigarette smoking during pregnancy can result in low birth weight babies. It has been associated with infertility, miscarriages, tubal pregnancies, infant death (mortality) and poor health (morbidity) in childhood. Additionally, cigarette smoking may cause long-term learning disabilities. If you smoke, you should try to quit before getting pregnant and not smoke during the pregnancy. Secondary smoke may also harm a mother and her developing baby. It is a good idea to ask people to stop smoking around you during your pregnancy and after the baby is born. Extra calcium is necessary when  you are pregnant and is found in your prenatal vitamin, in dairy products, green leafy vegetables and in calcium supplements. D A healthy diet according to your current weight and height, along with  vitamins and mineral supplements should be discussed with your caregiver. Domestic abuse or violence should be made known to your doctor right away to get the situation corrected. Drink more water when you exercise to keep hydrated. Discomfort of your back and legs usually develops and progresses from the middle of the second trimester through to delivery of the baby. This is because of the enlarging baby and uterus, which may also affect your balance. Do not take illegal drugs. Illegal drugs can seriously harm the baby and you. Drink extra fluids (water is best) throughout pregnancy to help your body keep up with the increases in your blood volume. Drink at least 6 to 8 glasses of water, fruit juice, or milk each day. A good way to know you are drinking enough fluid is when your urine looks almost like clear water or is very light yellow.  E Eat healthy to get the nutrients you and your unborn baby need. Your meals should include the five basic food groups. Exercise (30 minutes of light to moderate exercise a day) is important and encouraged during pregnancy, if there are no medical problems or problems with the pregnancy. Exercise that causes discomfort or dizziness should be stopped and reported to your caregiver. Emotions during pregnancy can change from being ecstatic to depression and should be understood by you, your partner and your family. F Fetal screening with ultrasound, amniocentesis and monitoring during pregnancy and labor is common and sometimes necessary. Take 400 micrograms of folic acid daily both before, when possible, and during the first few months of pregnancy to reduce the risk of birth defects of the brain and spine. All women who could possibly become pregnant should take a vitamin with folic acid, every day. It is also important to eat a healthy diet with fortified foods (enriched grain products, including cereals, rice, breads, and pastas) and foods with natural sources of folate  (orange juice, green leafy vegetables, beans, peanuts, broccoli, asparagus, peas, and lentils). The father should be involved with all aspects of the pregnancy including, the prenatal care, childbirth classes, labor, delivery, and postpartum time. Fathers may also have emotional concerns about being a father, financial needs, and raising a family. G Genetic testing should be done appropriately. It is important to know your family and the father's history. If there have been problems with pregnancies or birth defects in your family, report these to your doctor. Also, genetic counselors can talk with you about the information you might need in making decisions about having a family. You can call a major medical center in your area for help in finding a board-certified genetic counselor. Genetic testing and counseling should be done before pregnancy when possible, especially if there is a history of problems in the mother's or father's family. Certain ethnic backgrounds are more at risk for genetic defects. H Get familiar with the hospital where you will be having your baby. Get to know how long it takes to get there, the labor and delivery area, and the hospital procedures. Be sure your medical insurance is accepted there. Get your home ready for the baby including, clothes, the baby's room (when possible), furniture and car seat. Hand washing is important throughout the day, especially after handling raw meat and poultry, changing the baby's diaper or using the bathroom.  This can help prevent the spread of many bacteria and viruses that cause infection. Your hair may become dry and thinner, but will return to normal a few weeks after the baby is born. Heartburn is a common problem that can be treated by taking antacids recommended by your caregiver, eating smaller meals 5 or 6 times a day, not drinking liquids when eating, drinking between meals and raising the head of your bed 2 to 3 inches. I Insurance to  cover you, the baby, doctor and hospital should be reviewed so that you will be prepared to pay any costs not covered by your insurance plan. If you do not have medical insurance, there are usually clinics and services available for you in your community. Take 30 milligrams of iron during your pregnancy as prescribed by your doctor to reduce the risk of low red blood cells (anemia) later in pregnancy. All women of childbearing age should eat a diet rich in iron. J There should be a joint effort for the mother, father and any other children to adapt to the pregnancy financially, emotionally, and psychologically during the pregnancy. Join a support group for moms-to-be. Or, join a class on parenting or childbirth. Have the family participate when possible. K Know your limits. Let your caregiver know if you experience any of the following:   Pain of any kind.  Strong cramps.  You develop a lot of weight in a short period of time (5 pounds in 3 to 5 days).  Vaginal bleeding, leaking of amniotic fluid.  Headache, vision problems.  Dizziness, fainting, shortness of breath.  Chest pain.  Fever of 102 F (38.9 C) or higher.  Gush of clear fluid from your vagina.  Painful urination.  Domestic violence.  Irregular heartbeat (palpitations).  Rapid beating of the heart (tachycardia).  Constant feeling sick to your stomach (nauseous) and vomiting.  Trouble walking, fluid retention (edema).  Muscle weakness.  If your baby has decreased activity.  Persistent diarrhea.  Abnormal vaginal discharge.  Uterine contractions at 20-minute intervals.  Back pain that travels down your leg. L Learn and practice that what you eat and drink should be in moderation and healthy for you and your baby. Legal drugs such as alcohol and caffeine are important issues for pregnant women. There is no safe amount of alcohol a woman can drink while pregnant. Fetal alcohol syndrome, a disorder characterized  by growth retardation, facial abnormalities, and central nervous system dysfunction, is caused by a woman's use of alcohol during pregnancy. Caffeine, found in tea, coffee, soft drinks and chocolate, should also be limited. Be sure to read labels when trying to cut down on caffeine during pregnancy. More than 200 foods, beverages, and over-the-counter medications contain caffeine and have a high salt content! There are coffees and teas that do not contain caffeine. M Medical conditions such as diabetes, epilepsy, and high blood pressure should be treated and kept under control before pregnancy when possible, but especially during pregnancy. Ask your caregiver about any medications that may need to be changed or adjusted during pregnancy. If you are currently taking any medications, ask your caregiver if it is safe to take them while you are pregnant or before getting pregnant when possible. Also, be sure to discuss any herbs or vitamins you are taking. They are medicines, too! Discuss with your doctor all medications, prescribed and over-the-counter, that you are taking. During your prenatal visit, discuss the medications your doctor may give you during labor and delivery. N Never  be afraid to ask your doctor or caregiver questions about your health, the progress of the pregnancy, family problems, stressful situations, and recommendation for a pediatrician, if you do not have one. It is better to take all precautions and discuss any questions or concerns you may have during your office visits. It is a good idea to write down your questions before you visit the doctor. O Over-the-counter cough and cold remedies may contain alcohol or other ingredients that should be avoided during pregnancy. Ask your caregiver about prescription, herbs or over-the-counter medications that you are taking or may consider taking while pregnant.  P Physical activity during pregnancy can benefit both you and your baby by lessening  discomfort and fatigue, providing a sense of well-being, and increasing the likelihood of early recovery after delivery. Light to moderate exercise during pregnancy strengthens the belly (abdominal) and back muscles. This helps improve posture. Practicing yoga, walking, swimming, and cycling on a stationary bicycle are usually safe exercises for pregnant women. Avoid scuba diving, exercise at high altitudes (over 3000 feet), skiing, horseback riding, contact sports, etc. Always check with your doctor before beginning any kind of exercise, especially during pregnancy and especially if you did not exercise before getting pregnant. Q Queasiness, stomach upset and morning sickness are common during pregnancy. Eating a couple of crackers or dry toast before getting out of bed. Foods that you normally love may make you feel sick to your stomach. You may need to substitute other nutritious foods. Eating 5 or 6 small meals a day instead of 3 large ones may make you feel better. Do not drink with your meals, drink between meals. Questions that you have should be written down and asked during your prenatal visits. R Read about and make plans to baby-proof your home. There are important tips for making your home a safer environment for your baby. Review the tips and make your home safer for you and your baby. Read food labels regarding calories, salt and fat content in the food. S Saunas, hot tubs, and steam rooms should be avoided while you are pregnant. Excessive high heat may be harmful during your pregnancy. Your caregiver will screen and examine you for sexually transmitted diseases and genetic disorders during your prenatal visits. Learn the signs of labor. Sexual relations while pregnant is safe unless there is a medical or pregnancy problem and your caregiver advises against it. T Traveling long distances should be avoided especially in the third trimester of your pregnancy. If you do have to travel out of  state, be sure to take a copy of your medical records and medical insurance plan with you. You should not travel long distances without seeing your doctor first. Most airlines will not allow you to travel after 36 weeks of pregnancy. Toxoplasmosis is an infection caused by a parasite that can seriously harm an unborn baby. Avoid eating undercooked meat and handling cat litter. Be sure to wear gloves when gardening. Tingling of the hands and fingers is not unusual and is due to fluid retention. This will go away after the baby is born. U Womb (uterus) size increases during the first trimester. Your kidneys will begin to function more efficiently. This may cause you to feel the need to urinate more often. You may also leak urine when sneezing, coughing or laughing. This is due to the growing uterus pressing against your bladder, which lies directly in front of and slightly under the uterus during the first few months of pregnancy. If  you experience burning along with frequency of urination or bloody urine, be sure to tell your doctor. The size of your uterus in the third trimester may cause a problem with your balance. It is advisable to maintain good posture and avoid wearing high heels during this time. An ultrasound of your baby may be necessary during your pregnancy and is safe for you and your baby. V Vaccinations are an important concern for pregnant women. Get needed vaccines before pregnancy. Center for Disease Control ([employer]) has clear guidelines for the use of vaccines during pregnancy. Review the list, be sure to discuss it with your doctor. Prenatal vitamins are helpful and healthy for you and the baby. Do not take extra vitamins except what is recommended. Taking too much of certain vitamins can cause overdose problems. Continuous vomiting should be reported to your caregiver. Varicose veins may appear especially if there is a family history of varicose veins. They should subside after the  delivery of the baby. Support hose helps if there is leg discomfort. W Being overweight or underweight during pregnancy may cause problems. Try to get within 15 pounds of your ideal weight before pregnancy. Remember, pregnancy is not a time to be dieting! Do not stop eating or start skipping meals as your weight increases. Both you and your baby need the calories and nutrition you receive from a healthy diet. Be sure to consult with your doctor about your diet. There is a formula and diet plan available depending on whether you are overweight or underweight. Your caregiver or nutritionist can help and advise you if necessary. X Avoid X-rays. If you must have dental work or diagnostic tests, tell your dentist or physician that you are pregnant so that extra care can be taken. X-rays should only be taken when the risks of not taking them outweigh the risk of taking them. If needed, only the minimum amount of radiation should be used. When X-rays are necessary, protective lead shields should be used to cover areas of the body that are not being X-rayed. Y Your baby loves you. Breastfeeding your baby creates a loving and very close bond between the two of you. Give your baby a healthy environment to live in while you are pregnant. Infants and children require constant care and guidance. Their health and safety should be carefully watched at all times. After the baby is born, rest or take a nap when the baby is sleeping. Z Get your ZZZs. Be sure to get plenty of rest. Resting on your side as often as possible, especially on your left side is advised. It provides the best circulation to your baby and helps reduce swelling. Try taking a nap for 30 to 45 minutes in the afternoon when possible. After the baby is born rest or take a nap when the baby is sleeping. Try elevating your feet for that amount of time when possible. It helps the circulation in your legs and helps reduce swelling.  Most information courtesy  of the CDC. Document Released: 07/28/2005 Document Revised: 10/20/2011 Document Reviewed: 04/11/2009 [facility] Patient Information 2014 [place], [place].

## 2013-08-24 ENCOUNTER — Emergency Department (HOSPITAL_COMMUNITY): Payer: Medicaid Other

## 2013-08-24 ENCOUNTER — Emergency Department (HOSPITAL_COMMUNITY)
Admission: EM | Admit: 2013-08-24 | Discharge: 2013-08-24 | Disposition: A | Discharge status: Home / Self Care | Payer: Medicaid Other | Attending: Emergency Medicine | Admitting: Emergency Medicine

## 2013-08-24 ENCOUNTER — Encounter (HOSPITAL_COMMUNITY): Payer: Self-pay | Admitting: Emergency Medicine

## 2013-08-24 DIAGNOSIS — K5900 Constipation, unspecified: Secondary | ICD-10-CM | POA: Insufficient documentation

## 2013-08-24 DIAGNOSIS — O9989 Other specified diseases and conditions complicating pregnancy, childbirth and the puerperium: Secondary | ICD-10-CM | POA: Insufficient documentation

## 2013-08-24 DIAGNOSIS — R1031 Right lower quadrant pain: Secondary | ICD-10-CM

## 2013-08-24 DIAGNOSIS — R1032 Left lower quadrant pain: Secondary | ICD-10-CM | POA: Insufficient documentation

## 2013-08-24 DIAGNOSIS — O99330 Smoking (tobacco) complicating pregnancy, unspecified trimester: Secondary | ICD-10-CM | POA: Insufficient documentation

## 2013-08-24 DIAGNOSIS — Z3490 Encounter for supervision of normal pregnancy, unspecified, unspecified trimester: Secondary | ICD-10-CM

## 2013-08-24 DIAGNOSIS — R110 Nausea: Secondary | ICD-10-CM | POA: Insufficient documentation

## 2013-08-24 DIAGNOSIS — Z880 Allergy status to penicillin: Secondary | ICD-10-CM | POA: Insufficient documentation

## 2013-08-24 LAB — CBC WITH DIFFERENTIAL/PLATELET
Basophils Absolute: 0 10*3/uL (ref 0.0–0.1)
Basophils Relative: 0 % (ref 0–1)
Eosinophils Absolute: 0.3 10*3/uL (ref 0.0–0.7)
Eosinophils Relative: 3 % (ref 0–5)
HCT: 40.5 % (ref 36.0–46.0)
HEMOGLOBIN: 14 g/dL (ref 12.0–15.0)
LYMPHS ABS: 2.8 10*3/uL (ref 0.7–4.0)
LYMPHS PCT: 30 % (ref 12–46)
MCH: 30 pg (ref 26.0–34.0)
MCHC: 34.6 g/dL (ref 30.0–36.0)
MCV: 86.9 fL (ref 78.0–100.0)
MONOS PCT: 8 % (ref 3–12)
Monocytes Absolute: 0.8 10*3/uL (ref 0.1–1.0)
NEUTROS PCT: 59 % (ref 43–77)
Neutro Abs: 5.6 10*3/uL (ref 1.7–7.7)
PLATELETS: 214 10*3/uL (ref 150–400)
RBC: 4.66 MIL/uL (ref 3.87–5.11)
RDW: 13.7 % (ref 11.5–15.5)
WBC: 9.5 10*3/uL (ref 4.0–10.5)

## 2013-08-24 LAB — URINALYSIS, ROUTINE W REFLEX MICROSCOPIC
BILIRUBIN URINE: NEGATIVE
Glucose, UA: NEGATIVE mg/dL
HGB URINE DIPSTICK: NEGATIVE
KETONES UR: NEGATIVE mg/dL
Nitrite: NEGATIVE
PROTEIN: NEGATIVE mg/dL
Specific Gravity, Urine: 1.003 — ABNORMAL LOW (ref 1.005–1.030)
UROBILINOGEN UA: 0.2 mg/dL (ref 0.0–1.0)
pH: 7.5 (ref 5.0–8.0)

## 2013-08-24 LAB — COMPREHENSIVE METABOLIC PANEL
ALK PHOS: 50 U/L (ref 39–117)
ALT: 12 U/L (ref 0–35)
AST: 14 U/L (ref 0–37)
Albumin: 3.4 g/dL — ABNORMAL LOW (ref 3.5–5.2)
BILIRUBIN TOTAL: 0.6 mg/dL (ref 0.3–1.2)
BUN: 6 mg/dL (ref 6–23)
CO2: 21 meq/L (ref 19–32)
Calcium: 8.8 mg/dL (ref 8.4–10.5)
Chloride: 102 mEq/L (ref 96–112)
Creatinine, Ser: 0.5 mg/dL (ref 0.50–1.10)
GLUCOSE: 84 mg/dL (ref 70–99)
POTASSIUM: 3.7 meq/L (ref 3.7–5.3)
SODIUM: 135 meq/L — AB (ref 137–147)
Total Protein: 6.7 g/dL (ref 6.0–8.3)

## 2013-08-24 LAB — URINE MICROSCOPIC-ADD ON

## 2013-08-24 LAB — HCG, QUANTITATIVE, PREGNANCY: hCG, Beta Chain, Quant, S: 55442 m[IU]/mL — ABNORMAL HIGH (ref <5)

## 2013-08-24 LAB — WET PREP, GENITAL
Clue Cells Wet Prep HPF POC: NONE SEEN
Trich, Wet Prep: NONE SEEN
YEAST WET PREP: NONE SEEN

## 2013-08-24 LAB — LIPASE, BLOOD: LIPASE: 37 U/L (ref 11–59)

## 2013-08-24 NOTE — ED Provider Notes (Signed)
Medical screening examination/treatment/procedure(s) were conducted as a shared visit with non-physician practitioner(s) and myself.  I personally evaluated the patient during the encounter.  EKG Interpretation   None      Patient here with abdominal pain x24 hours with some associated hard stool. Had a positive home pregnant test but denies any vaginal bleeding or discharge. Abdominal exam here is nonsurgical. Pelvic ultrasound shows an IUP and patient will followup with her GYN doctor. No Signs of obstruction at this time  Toy BakerAnthony T Yuriel Lopezmartinez, MD 08/24/13 1550

## 2013-08-24 NOTE — ED Notes (Signed)
Patient states she has recently found out she is pregnant.  She is approximately 6 wks at this time.   Patient complains of sharp pain in RLQ.   Patient states for the last two days she has had dark, hard stool and constipated feeling.   Patient denies any blood in stool.   Patient denies any other symptoms.

## 2013-08-24 NOTE — Discharge Instructions (Signed)
Call and make appointment with [facility] for obstetrical follow up. Safe to take OTC docusate stool softeners short term for symptoms of constipation. Return to ED if abdominal pain worsens.    Emergency Department Resource Guide 1) Find a Doctor and Pay Out of Pocket Although you won't have to find out who is covered by your insurance plan, it is a good idea to ask around and get recommendations. You will then need to call the office and see if the doctor you have chosen will accept you as a new patient and what types of options they offer for patients who are self-pay. Some doctors offer discounts or will set up payment plans for their patients who do not have insurance, but you will need to ask so you aren't surprised when you get to your appointment.  2) Contact Your Local Health Department Not all health departments have doctors that can see patients for sick visits, but many do, so it is worth a call to see if yours does. If you don't know where your local health department is, you can check in your phone book. The CDC also has a tool to help you locate your state's health department, and many state websites also have listings of all of their local health departments.  3) Find a Walk-in Clinic If your illness is not likely to be very severe or complicated, you may want to try a walk in clinic. These are popping up all over the country in pharmacies, drugstores, and shopping centers. They're usually staffed by nurse practitioners or physician assistants that have been trained to treat common illnesses and complaints. They're usually fairly quick and inexpensive. However, if you have serious medical issues or chronic medical problems, these are probably not your best option.  No Primary Care Doctor: - Call Health Connect at  [phone] - they can help you locate a primary care doctor that  accepts your insurance, provides certain services, etc. - Physician Referral Service-  [phone]  Chronic Pain Problems: Organization         Address  Phone   Notes  [facility]  [phone] Patients need to be referred by their primary care doctor.   Medication Assistance: Organization         Address  Phone   Notes  [facility] [address] [place], [place] [address] [phone] --Must be a resident of [facility] [place] -- Must have NO insurance coverage whatsoever (no Medicaid/ Medicare, etc.) -- The pt. MUST have a primary care doctor that directs their care regularly and follows them in the community   MedAssist  [phone]   [employer]  [phone]    Agencies that provide inexpensive medical care: Organization         Address  Phone   Notes  [facility]  [phone]) [phone]   [facility]    [phone]   [facility] [address] [place], [place] [address] [phone]   [facility] [address], [place] [phone]) [phone]   [facility]anned Parenthood    [phone]   [facility]    [phone]) [phone]   [facility]  [address], [place] Phone:  [phone], Fax:  [phone] Hours of Operation:  9 am - 6 pm, M-F.  Also accepts Medicaid/Medicare and self-pay.  [facility]  [address] [place], [address], [place] Phone: [phone], Fax: [phone]. Hours of Operation:  8:30 am - 5:30 pm, M-F.  Also accepts Medicaid and self-pay.  [facility] [address], [place] Phone: [phone]   [place], [place], [place] [phone], Ext. [phone] Mondays & Thursdays: 7-9 AM.  First 15 patients are seen on a first come, first serve basis.    [place] Providers:  Organization         Address  Phone   Notes  [facility] [address],  [place] [phone] Also accepts self-pay patients.  [facility] [address], [place]  [phone]   [place], [address], [place] [phone]   [facility] [address] [place] [phone]   Lucianne Lei [address], [place]   [phone] Only accepts [place] Access [place] patients after they have their name applied to their card.   Self-Pay (no insurance) in [facility]:  Organization         Address  Phone   Notes  Sickle Cell Patients, [facility] [place] [phone]   [facility] [place] [phone]   [facility] [place]  [place], [place], [place] [phone]   [facility]/Dr. Osei-Bonsu  [address], [place] or [address], [place] [phone] Phone number for both [place] and [place] locations is the same.  Urgent [facility] [address], [place] [phone]   [facility] [address] [place] or [address] [phone]   [facility] [address] [place] [phone], phone; [phone], fax Sees patients 1st and 3rd Saturday of every month.  Must not qualify for public or private insurance (i.e. Medicaid, Medicare, [place] Health Choice, Veterans' Benefits)  Household income should be no more than 200% of the poverty level The clinic cannot treat you if you are pregnant or think you are pregnant  Sexually transmitted diseases are not treated at the clinic.    Dental Care: Organization         Address  Phone  Notes  [facility] Department of [facility] [place] [phone] Accepts children up to age 50 who are enrolled in  [place] or [place]; pregnant women with a Medicaid card; and children who have applied for Medicaid or [place] Health Choice, but were declined, whose parents can pay a reduced fee at time of service.  [facility] Department of [facility]  [address], [place] [phone] Accepts children up to age 26 who are enrolled in [place] or [place]; pregnant women with a Medicaid card; and children who have applied for Medicaid or [place] Health Choice, but were declined, whose parents can pay a reduced fee at time of service.  [facility] [place] [facility] Access PROGRAM  [place] [phone] Patients are seen by appointment only. Walk-ins are not accepted. [place] will see patients 70 years of age and older. Monday - Tuesday (8am-5pm) Most Wednesdays (8:30-5pm) $30 per visit, cash only  [place] Adult  Dental Access PROGRAM  [address], [facility] [phone] Patients are seen by appointment only. Walk-ins are not accepted. [place] will see patients 42 years of age and older. One Wednesday Evening (Monthly: Volunteer Based).  $30 per visit, cash only  [place]  [phone] for adults; Children under age 54, call Graduate Pediatric Dentistry at [phone]. Children aged 65-14, please call [phone] to request a pediatric application.  Dental services are provided in all areas of dental care including fillings, crowns and bridges, complete and partial dentures, implants, gum treatment, root canals, and extractions. Preventive care is also provided. Treatment is provided to both adults and children. Patients are selected via a lottery and there is often a waiting list.   [facility] [address] [place]  [phone] [email or web address]   [facility] [address] [place], [place] [phone], Ext. [phone] Second and Fourth Thursday of each month, opens at 6:30  AM; Clinic ends at 9 AM.  Patients are seen on a first-come first-served basis, and a limited number are seen during each clinic.   [facility]  [address] Hillard Danker [place], [place] [phone]   Eligibility Requirements You must have lived in [place], [place], or [place] counties for at least the last three months.   You cannot be eligible for state or federal sponsored [employer], including [employer], [place], or [employer].   You generally cannot be eligible for healthcare insurance through your employer.    How to apply: Eligibility screenings are held every Tuesday and Wednesday afternoon from 1:00 pm until 4:00 pm. You do not need an appointment for the interview!  [facility] [address] [place], [place]   [place]  [place] Department  [place]  [phone]    Behavioral Health Resources in the Community: Intensive Outpatient Programs Organization         Address  Phone  Notes  [place]. [address], [place], [place] [phone]   [facility] Outpatient [address], [place], [place]   ADS: Alcohol & Drug Svcs [address] [place], [place]   [place] [address]  [place], [place] or [phone]   Substance Abuse Resources Organization         Address  Phone  Notes  Alcohol and Drug Services  [phone]   [place]  [phone]   [facility] [place]   Chinita Pester  [phone]   Residential & Outpatient Substance Abuse Program  [phone]   Psychological Services Organization         Address  Phone  Notes  [facility] [place]  [place]  [phone]   [place] [address], [place] [phone] or  [phone]    Mobile Crisis Teams Organization         Address  Phone  Notes  [facility], Mobile Crisis Care Unit  [phone]   [facility]ive Psychotherapeutic Services  [address] [place], [place]   [place] Levels [address] [place] [phone]    Self-Help/Support Groups Organization         Address  Phone             Notes  Mental  Health Assoc[facility] of [place] - variety of support groups  [place] Call for more information  Narcotics Anonymous (NA), Caring [facility] [address], [employer] [place]  2 meetings at this location   Special educational needs teacher         Address  Phone  Notes  [facility] Residential Treatment [place],    [place]  [phone]   [facility]  [address] [place] 748270, [place], [place]   [place], [place] [phone] Admissions: 8am-3pm M-F  Incentives Substance [place] [address],    [place], [place] [phone]   [facility] [address] [place], [place], [place]   [facility] [place] [facility]ually Violent [facility] [address]  [place], [place]   [facility]ight Programs - Intensive Outpatient [address], [place], [place]   [facility] [place].,  [place], [place] [phone] or [phone]   Residential Treatment Services (RTS) [address] [place], [place] Accepts Medicaid  [facility] [place] [address]  [place] [phone] Substance Abuse/Addiction Treatment   [facility] Organization         Address  Phone  Notes  [facility]  [phone]   Domenic Schwab, PhD [address] [place], [place]   [phone] or [phone]   [place], [place] [phone]   [facility] [address]  [place], [place] [phone] Insurance/Medicaid/sponsorship through [facility] and Families [address]                                    [place], [place] [phone] [place] [address], [place] [phone]    Dr. Adele Schilder  [phone]   [facility] Dept. 1) [address], [place] 2) [place] 3)  [place] [address], [place] [phone]  [phone]   [place] [phone] or [phone] (After Hours)

## 2013-08-24 NOTE — ED Provider Notes (Signed)
CSN: [id number]     Arrival date & time 08/24/13  1048 History   First MD Initiated Contact with Patient 08/24/13 1214     Chief Complaint  Patient presents with  . Abdominal Pain    RLQ   (Consider location/radiation/quality/duration/timing/severity/associated sxs/prior Treatment) Patient is a 28 y.o. female presenting with abdominal pain.  Abdominal Pain Associated symptoms: no chest pain, no chills, no dysuria, no fever, no hematuria, no shortness of breath, no vaginal bleeding and no vaginal discharge    28 yo female with a recent positive urine pregnancy on 08/16/13 presents today to the ED with c/o 2 day hx of lower abdominal pain. Patient localizes pain to the RLQ. Patient states pain is mild at 4-5/10 with intermittent sharp 10/10 pains. Patient admits to nausea, and constipation. Denies vomiting, diarrhea. Patient reports last BM was yesterday and noted to be hard and dark. Denies bloody stools or any urinary symptoms. Patient denies any flatus in past 5 hours but admits to belching. PMH significant for abdominal surgery and SBO that resolved without surgical intervention. Patient reports this is her first pregnancy. Denies hx of IUD use.   Past Medical History  Diagnosis Date  . Small bowel obstruction    Past Surgical History  Procedure Laterality Date  . Abdominal surgery     No family history on file. History  Substance Use Topics  . Smoking status: Current Every Day Smoker  . Smokeless tobacco: Not on file  . Alcohol Use: Yes   OB History   Grav Para Term Preterm Abortions TAB SAB Ect Mult Living                 Review of Systems  Constitutional: Negative for fever and chills.  Respiratory: Negative for shortness of breath.   Cardiovascular: Negative for chest pain and leg swelling.  Gastrointestinal: Positive for abdominal pain. Negative for blood in stool and abdominal distention.  Genitourinary: Positive for pelvic pain. Negative for dysuria, hematuria, flank  pain, vaginal bleeding, vaginal discharge and vaginal pain.  Musculoskeletal: Negative for back pain, neck pain and neck stiffness.  Skin: Negative for rash.  Allergic/Immunologic: Negative for immunocompromised state.  Neurological: Negative for dizziness and headaches.  All other systems reviewed and are negative.    Allergies  Penicillins  Home Medications   Current Outpatient Rx  Name  Route  Sig  Dispense  Refill  . Prenatal Vit-Fe Fumarate-FA (PRENATAL MULTIVITAMIN) TABS tablet   Oral   Take 1 tablet by mouth daily at 12 noon.          BP 105/62  Pulse 59  Temp(Src) 97.9 F (36.6 C) (Oral)  Resp 22  Ht 5\' 2"  (1.575 m)  Wt 151 lb 3.2 oz (68.584 kg)  BMI 27.65 kg/m2  SpO2 100%  LMP 07/13/2013 Physical Exam  Nursing note and vitals reviewed. Constitutional: She is oriented to person, place, and time. She appears well-developed and well-nourished. No distress.  HENT:  Head: Normocephalic and atraumatic.  Eyes: Conjunctivae and EOM are normal. No scleral icterus.  Neck: Normal range of motion. Neck supple.  Cardiovascular: Normal rate and regular rhythm.  Exam reveals no gallop and no friction rub.   No murmur heard. Pulmonary/Chest: Effort normal and breath sounds normal. No respiratory distress. She has no wheezes. She has no rales.  Abdominal: Soft. She exhibits no distension and no mass. Bowel sounds are decreased. There is no hepatosplenomegaly. There is tenderness in the right lower quadrant, suprapubic area and left  lower quadrant. There is no rigidity, no rebound, no guarding, no CVA tenderness and negative Murphy's sign.  Musculoskeletal: Normal range of motion. She exhibits no edema.  Neurological: She is alert and oriented to person, place, and time.  Skin: Skin is warm and dry. She is not diaphoretic.  Psychiatric: She has a normal mood and affect. Her behavior is normal.    ED Course  Procedures (including critical care time) Labs Review Labs  Reviewed  WET PREP, GENITAL - Abnormal; Notable for the following:    WBC, Wet Prep HPF POC FEW (*)    All other components within normal limits  COMPREHENSIVE METABOLIC PANEL - Abnormal; Notable for the following:    Sodium 135 (*)    Albumin 3.4 (*)    All other components within normal limits  URINALYSIS, ROUTINE W REFLEX MICROSCOPIC - Abnormal; Notable for the following:    Specific Gravity, Urine 1.003 (*)    Leukocytes, UA TRACE (*)    All other components within normal limits  URINE MICROSCOPIC-ADD ON - Abnormal; Notable for the following:    Squamous Epithelial / LPF FEW (*)    All other components within normal limits  HCG, QUANTITATIVE, PREGNANCY - Abnormal; Notable for the following:    hCG, Beta Chain, Quant, S [id number] (*)    All other components within normal limits  GC/CHLAMYDIA PROBE AMP  CBC WITH DIFFERENTIAL  LIPASE, BLOOD   Imaging Review [place] Ob Comp Less 14 Wks  08/24/2013   CLINICAL DATA:  28 year old female with pain. Pregnant, gestational age by first ultrasound 6 weeks and 4 days. Initial encounter.  EXAM: OBSTETRIC <14 WK US AND TRANSVAGINAL OB US  TECHNIQUE: Both transabdominal and transvaginal ultrasound examinations were performed for complete evaluation of the gestation as well as the maternal uterus, adnexal regions, and pelvic cul-de-sac. Transvaginal technique was performed to assess early pregnancy.  COMPARISON:  None relevant.  FINDINGS: Intrauterine gestational sac: Single  Yolk sac:  Visible  Embryo:  Visible  Cardiac Activity: Detected  Heart Rate:  131 bpm  CRL:   7.4  mm   6 w for d                  US EDC: 04/15/2014  Maternal uterus/adnexae: No subchorionic hemorrhage or pelvic free fluid. Despite repeated transabdominal and transvaginal imaging, neither ovary is delineated. No adnexal region mass identified.  IMPRESSION: Single living IUP demonstrated. The ovaries could not be delineated, but no acute maternal finding is visualized.   Electronically  Signed   By: Augusto GambleLee  Hall M.D.   On: 08/24/2013 15:11   [place] Ob Transvaginal  08/24/2013   CLINICAL DATA:  28 year old female with pain. Pregnant, gestational age by first ultrasound 6 weeks and 4 days. Initial encounter.  EXAM: OBSTETRIC <14 WK US AND TRANSVAGINAL OB US  TECHNIQUE: Both transabdominal and transvaginal ultrasound examinations were performed for complete evaluation of the gestation as well as the maternal uterus, adnexal regions, and pelvic cul-de-sac. Transvaginal technique was performed to assess early pregnancy.  COMPARISON:  None relevant.  FINDINGS: Intrauterine gestational sac: Single  Yolk sac:  Visible  Embryo:  Visible  Cardiac Activity: Detected  Heart Rate:  131 bpm  CRL:   7.4  mm   6 w for d                  US EDC: 04/15/2014  Maternal uterus/adnexae: No subchorionic hemorrhage or pelvic free fluid. Despite repeated transabdominal and transvaginal imaging, neither  ovary is delineated. No adnexal region mass identified.  IMPRESSION: Single living IUP demonstrated. The ovaries could not be delineated, but no acute maternal finding is visualized.   Electronically Signed   By: Augusto Gamble M.D.   On: 08/24/2013 15:11    EKG Interpretation   None       MDM   1. RLQ abdominal pain   2. Intrauterine pregnancy    Patient afebrile with stable VS.  Labs appear WNL.  Ultrasound shows single living IUP. No other abnormalities noted. G/C cultures pending.  Patient advised to [facility] with [facility] for obstetrical care as soon as possible. Recommend OTC docusate stool softener for sxs of constipation (recommend suppository). Patient agrees with plan. Discharged in good condition.     Rudene Anda, PA-C 08/24/13 2202

## 2013-08-24 NOTE — ED Notes (Signed)
Spoke with main lab, Velna HatchetSheila, states she will add on HCG preg to bloodwork sent down

## 2013-08-25 LAB — GC/CHLAMYDIA PROBE AMP
CT Probe RNA: NEGATIVE
GC Probe RNA: NEGATIVE

## 2013-08-25 NOTE — ED Provider Notes (Signed)
Medical screening examination/treatment/procedure(s) were performed by non-physician practitioner and as supervising physician I was immediately available for consultation/collaboration.  EKG Interpretation   None        Toy BakerAnthony T Yarelin Reichardt, MD 08/25/13 1555

## 2013-09-28 ENCOUNTER — Other Ambulatory Visit (HOSPITAL_COMMUNITY)
Admission: RE | Admit: 2013-09-28 | Discharge: 2013-09-28 | Disposition: A | Discharge status: Home / Self Care | Payer: Medicaid Other | Source: Ambulatory Visit | Attending: Family | Admitting: Family

## 2013-09-28 ENCOUNTER — Encounter: Payer: Self-pay | Admitting: Family

## 2013-09-28 ENCOUNTER — Telehealth: Payer: Self-pay | Admitting: *Deleted

## 2013-09-28 ENCOUNTER — Ambulatory Visit (INDEPENDENT_AMBULATORY_CARE_PROVIDER_SITE_OTHER): Payer: Medicaid Other | Admitting: Family

## 2013-09-28 VITALS — BP 110/70 | Temp 97.0°F | Wt 155.1 lb

## 2013-09-28 DIAGNOSIS — Z124 Encounter for screening for malignant neoplasm of cervix: Secondary | ICD-10-CM

## 2013-09-28 DIAGNOSIS — O2390 Unspecified genitourinary tract infection in pregnancy, unspecified trimester: Secondary | ICD-10-CM

## 2013-09-28 DIAGNOSIS — Z01419 Encounter for gynecological examination (general) (routine) without abnormal findings: Secondary | ICD-10-CM | POA: Insufficient documentation

## 2013-09-28 DIAGNOSIS — N390 Urinary tract infection, site not specified: Secondary | ICD-10-CM

## 2013-09-28 DIAGNOSIS — Z3490 Encounter for supervision of normal pregnancy, unspecified, unspecified trimester: Secondary | ICD-10-CM | POA: Insufficient documentation

## 2013-09-28 DIAGNOSIS — Z3480 Encounter for supervision of other normal pregnancy, unspecified trimester: Secondary | ICD-10-CM

## 2013-09-28 DIAGNOSIS — O2340 Unspecified infection of urinary tract in pregnancy, unspecified trimester: Secondary | ICD-10-CM

## 2013-09-28 DIAGNOSIS — Z113 Encounter for screening for infections with a predominantly sexual mode of transmission: Secondary | ICD-10-CM

## 2013-09-28 HISTORY — DX: Unspecified infection of urinary tract in pregnancy, unspecified trimester: O23.40

## 2013-09-28 LAB — POCT URINALYSIS DIP (DEVICE)
Bilirubin Urine: NEGATIVE
GLUCOSE, UA: NEGATIVE mg/dL
Hgb urine dipstick: NEGATIVE
KETONES UR: NEGATIVE mg/dL
LEUKOCYTES UA: NEGATIVE
NITRITE: POSITIVE — AB
Protein, ur: NEGATIVE mg/dL
Specific Gravity, Urine: 1.02 (ref 1.005–1.030)
Urobilinogen, UA: 0.2 mg/dL (ref 0.0–1.0)
pH: 7 (ref 5.0–8.0)

## 2013-09-28 LAB — GLUCOSE TOLERANCE, 1 HOUR (50G) W/O FASTING: Glucose, 1 Hour GTT: 87 mg/dL (ref 70–140)

## 2013-09-28 LAB — HIV ANTIBODY (ROUTINE TESTING W REFLEX): HIV: NONREACTIVE

## 2013-09-28 MED ORDER — CEPHALEXIN 500 MG PO CAPS: 500.0000 mg | ORAL_CAPSULE | Freq: Three times a day (TID) | ORAL | Status: DC

## 2013-09-28 NOTE — Patient Instructions (Signed)
Pregnancy - First Trimester  During sexual intercourse, millions of sperm go into the vagina. Only 1 sperm will penetrate and fertilize the female egg while it is in the Fallopian tube. One week later, the fertilized egg implants into the wall of the uterus. An embryo begins to develop into a baby. At 6 to 8 weeks, the eyes and face are formed and the heartbeat can be seen on ultrasound. At the end of 12 weeks (first trimester), all the baby's organs are formed. Now that you are pregnant, you will want to do everything you can to have a healthy baby. Two of the most important things are to get good prenatal care and follow your caregiver's instructions. Prenatal care is all the medical care you receive before the baby's birth. It is given to prevent, find, and treat problems during the pregnancy and childbirth.  PRENATAL EXAMS  · During prenatal visits, your weight, blood pressure, and urine are checked. This is done to make sure you are healthy and progressing normally during the pregnancy.  · A pregnant woman should gain 25 to 35 pounds during the pregnancy. However, if you are overweight or underweight, your caregiver will advise you regarding your weight.  · Your caregiver will ask and answer questions for you.  · Blood work, cervical cultures, other necessary tests, and a Pap test are done during your prenatal exams. These tests are done to check on your health and the probable health of your baby. Tests are strongly recommended and done for HIV with your permission. This is the virus that causes AIDS. These tests are done because medicines can be given to help prevent your baby from being born with this infection should you have been infected without knowing it. Blood work is also used to find out your blood type, previous infections, and follow your blood levels (hemoglobin).  · Low hemoglobin (anemia) is common during pregnancy. Iron and vitamins are given to help prevent this. Later in the pregnancy, blood  tests for diabetes will be done along with any other tests if any problems develop.  · You may need other tests to make sure you and the baby are doing well.  CHANGES DURING THE FIRST TRIMESTER   Your body goes through many changes during pregnancy. They vary from person to person. Talk to your caregiver about changes you notice and are concerned about. Changes can include:  · Your menstrual period stops.  · The egg and sperm carry the genes that determine what you look like. Genes from you and your partner are forming a baby. The female genes determine whether the baby is a boy or a girl.  · Your body increases in girth and you may feel bloated.  · Feeling sick to your stomach (nauseous) and throwing up (vomiting). If the vomiting is uncontrollable, call your caregiver.  · Your breasts will begin to enlarge and become tender.  · Your nipples may stick out more and become darker.  · The need to urinate more. Painful urination may mean you have a bladder infection.  · Tiring easily.  · Loss of appetite.  · Cravings for certain kinds of food.  · At first, you may gain or lose a couple of pounds.  · You may have changes in your emotions from day to day (excited to be pregnant or concerned something may go wrong with the pregnancy and baby).  · You may have more vivid and strange dreams.  HOME CARE INSTRUCTIONS   ·   It is very important to avoid all smoking, alcohol and non-prescribed drugs during your pregnancy. These affect the formation and growth of the baby. Avoid chemicals while pregnant to ensure the delivery of a healthy infant.  · Start your prenatal visits by the 12th week of pregnancy. They are usually scheduled monthly at first, then more often in the last 2 months before delivery. Keep your caregiver's appointments. Follow your caregiver's instructions regarding medicine use, blood and lab tests, exercise, and diet.  · During pregnancy, you are providing food for you and your baby. Eat regular, well-balanced  meals. Choose foods such as meat, fish, milk and other low fat dairy products, vegetables, fruits, and whole-grain breads and cereals. Your caregiver will tell you of the ideal weight gain.  · You can help morning sickness by keeping soda crackers at the bedside. Eat a couple before arising in the morning. You may want to use the crackers without salt on them.  · Eating 4 to 5 small meals rather than 3 large meals a day also may help the nausea and vomiting.  · Drinking liquids between meals instead of during meals also seems to help nausea and vomiting.  · A physical sexual relationship may be continued throughout pregnancy if there are no other problems. Problems may be early (premature) leaking of amniotic fluid from the membranes, vaginal bleeding, or belly (abdominal) pain.  · Exercise regularly if there are no restrictions. Check with your caregiver or physical therapist if you are unsure of the safety of some of your exercises. Greater weight gain will occur in the last 2 trimesters of pregnancy. Exercising will help:  · Control your weight.  · Keep you in shape.  · Prepare you for labor and delivery.  · Help you lose your pregnancy weight after you deliver your baby.  · Wear a good support or jogging bra for breast tenderness during pregnancy. This may help if worn during sleep too.  · Ask when prenatal classes are available. Begin classes when they are offered.  · Do not use hot tubs, steam rooms, or saunas.  · Wear your seat belt when driving. This protects you and your baby if you are in an accident.  · Avoid raw meat, uncooked cheese, cat litter boxes, and soil used by cats throughout the pregnancy. These carry germs that can cause birth defects in the baby.  · The first trimester is a good time to visit your dentist for your dental health. Getting your teeth cleaned is okay. Use a softer toothbrush and brush gently during pregnancy.  · Ask for help if you have financial, counseling, or nutritional needs  during pregnancy. Your caregiver will be able to offer counseling for these needs as well as refer you for other special needs.  · Do not take any medicines or herbs unless told by your caregiver.  · Inform your caregiver if there is any mental or physical domestic violence.  · Make a list of emergency phone numbers of family, friends, hospital, and police and fire departments.  · Write down your questions. Take them to your prenatal visit.  · Do not douche.  · Do not cross your legs.  · If you have to stand for long periods of time, rotate you feet or take small steps in a circle.  · You may have more vaginal secretions that may require a sanitary pad. Do not use tampons or scented sanitary pads.  MEDICINES AND DRUG USE IN PREGNANCY  ·   Take prenatal vitamins as directed. The vitamin should contain 1 milligram of folic acid. Keep all vitamins out of reach of children. Only a couple vitamins or tablets containing iron may be fatal to a baby or young child when ingested.  · Avoid use of all medicines, including herbs, over-the-counter medicines, not prescribed or suggested by your caregiver. Only take over-the-counter or prescription medicines for pain, discomfort, or fever as directed by your caregiver. Do not use aspirin, ibuprofen, or naproxen unless directed by your caregiver.  · Let your caregiver also know about herbs you may be using.  · Alcohol is related to a number of birth defects. This includes fetal alcohol syndrome. All alcohol, in any form, should be avoided completely. Smoking will cause low birth rate and premature babies.  · Street or illegal drugs are very harmful to the baby. They are absolutely forbidden. A baby born to an addicted mother will be addicted at birth. The baby will go through the same withdrawal an adult does.  · Let your caregiver know about any medicines that you have to take and for what reason you take them.  SEEK MEDICAL CARE IF:   You have any concerns or worries during your  pregnancy. It is better to call with your questions if you feel they cannot wait, rather than worry about them.  SEEK IMMEDIATE MEDICAL CARE IF:   · An unexplained oral temperature above 102° F (38.9° C) develops, or as your caregiver suggests.  · You have leaking of fluid from the vagina (birth canal). If leaking membranes are suspected, take your temperature and inform your caregiver of this when you call.  · There is vaginal spotting or bleeding. Notify your caregiver of the amount and how many pads are used.  · You develop a bad smelling vaginal discharge with a change in the color.  · You continue to feel sick to your stomach (nauseated) and have no relief from remedies suggested. You vomit blood or coffee ground-like materials.  · You lose more than 2 pounds of weight in 1 week.  · You gain more than 2 pounds of weight in 1 week and you notice swelling of your face, hands, feet, or legs.  · You gain 5 pounds or more in 1 week (even if you do not have swelling of your hands, face, legs, or feet).  · You get exposed to German measles and have never had them.  · You are exposed to fifth disease or chickenpox.  · You develop belly (abdominal) pain. Round ligament discomfort is a common non-cancerous (benign) cause of abdominal pain in pregnancy. Your caregiver still must evaluate this.  · You develop headache, fever, diarrhea, pain with urination, or shortness of breath.  · You fall or are in a car accident or have any kind of trauma.  · There is mental or physical violence in your home.  Document Released: 07/22/2001 Document Revised: 04/21/2012 Document Reviewed: 01/23/2009  Exit[employer]® Patient Information ©2014 [employer].

## 2013-09-28 NOTE — Progress Notes (Signed)
Pulse: 72

## 2013-09-28 NOTE — Telephone Encounter (Signed)
Called pt and left message on her voice mail that we have the information packet that she left behind at today's visit. She may come by the office to pick it up anytime during regular business hours.

## 2013-09-28 NOTE — Progress Notes (Signed)
   Subjective:    Suzanne RushingSerita Lane is a G1P0000 5536w0d being seen today for her first obstetrical visit.  This is patient's first pregnancy. Patient does intend to breast feed. Here with father of baby.  Pregnancy history fully reviewed.  Patient reports no bleeding and no cramping.  Nausea of early pregnancy has improved.    Filed Vitals:   09/28/13 0955  BP: 110/70  Temp: 97 F (36.1 C)  Weight: 155 lb 1.6 oz (70.353 kg)    HISTORY: OB History  Gravida Para Term Preterm AB SAB TAB Ectopic Multiple Living  1 0 0 0 0 0 0 0 0 0     # Outcome Date GA Lbr Len/2nd Weight Sex Delivery Anes PTL Lv  1 CUR              Past Medical History  Diagnosis Date  . Small bowel obstruction     As infant with surgery; recent obstruction 2012   Past Surgical History  Procedure Laterality Date  . Abdominal surgery      Surgery as infant   Family History  Problem Relation Age of Onset  . Diabetes Father      Exam   Exam   BP 110/70  Temp(Src) 97 F (36.1 C)  Wt 155 lb 1.6 oz (70.353 kg)  LMP 07/07/2013 Uterine Size: size equals dates  Pelvic Exam:    Perineum: No Hemorrhoids, Normal Perineum   Vulva: normal   Vagina:  normal mucosa, normal discharge, no palpable nodules   pH: Not done   Cervix: no bleeding following Pap, no cervical motion tenderness and no lesions   Adnexa: normal adnexa and no mass, fullness, tenderness   Bony Pelvis: Adequate  System: Breast:  No nipple retraction or dimpling, No nipple discharge or bleeding.  Inverted nipples noted on exam.  No axillary or supraclavicular adenopathy, Normal to palpation without dominant masses   Skin: normal coloration and turgor, no rashes    Neurologic: negative   Extremities: normal strength, tone, and muscle mass   HEENT neck supple with midline trachea and thyroid without masses   Mouth/Teeth mucous membranes moist, pharynx normal without lesions   Neck supple and no masses   Cardiovascular: regular rate and  rhythm, no murmurs or gallops   Respiratory:  appears well, vitals normal, no respiratory distress, acyanotic, normal RR, neck free of mass or lymphadenopathy, chest clear, no wheezing, crepitations, rhonchi, normal symmetric air entry   Abdomen: soft, non-tender; bowel sounds normal; no masses,  no organomegaly   Urinary: urethral meatus normal     Assessment:    Pregnancy: 28 yo G1P0000 at 7436w0d wks IUP  Patient Active Problem List   Diagnosis Date Noted  . Supervision of normal pregnancy 09/28/2013  . Small bowel obstruction   Urinary Tract Infection Complicating Pregnancy      Plan:     Initial labs drawn. Early 1 hr obtained secondary to weight.   Prenatal vitamins. Problem list reviewed and updated. Genetic Screening discussed First Screen: ordered. RX Keflex 500 mg TID x 7 days > urine culture sent.     Follow up in 4 weeks.    [facility] PartnershipMUHAMMAD,WALIDAH 09/28/2013

## 2013-09-29 ENCOUNTER — Encounter: Payer: Self-pay | Admitting: Family

## 2013-09-29 LAB — PRESCRIPTION MONITORING PROFILE (19 PANEL)
AMPHETAMINE/METH: NEGATIVE ng/mL
BENZODIAZEPINE SCREEN, URINE: NEGATIVE ng/mL
BUPRENORPHINE, URINE: NEGATIVE ng/mL
Barbiturate Screen, Urine: NEGATIVE ng/mL
CARISOPRODOL, URINE: NEGATIVE ng/mL
Cannabinoid Scrn, Ur: NEGATIVE ng/mL
Cocaine Metabolites: NEGATIVE ng/mL
Creatinine, Urine: 93.89 mg/dL (ref >20.0)
ECSTASY: NEGATIVE ng/mL
Fentanyl, Ur: NEGATIVE ng/mL
MEPERIDINE UR: NEGATIVE ng/mL
METHADONE SCREEN, URINE: NEGATIVE ng/mL
METHAQUALONE SCREEN (URINE): NEGATIVE ng/mL
Nitrites, Initial: NEGATIVE ug/mL
Opiate Screen, Urine: NEGATIVE ng/mL
Oxycodone Screen, Ur: NEGATIVE ng/mL
PH URINE, INITIAL: 8.7 pH (ref 4.5–8.9)
Phencyclidine, Ur: NEGATIVE ng/mL
Propoxyphene: NEGATIVE ng/mL
TRAMADOL UR: NEGATIVE ng/mL
Tapentadol, urine: NEGATIVE ng/mL
ZOLPIDEM, URINE: NEGATIVE ng/mL

## 2013-09-29 LAB — OBSTETRIC PANEL
ANTIBODY SCREEN: NEGATIVE
Basophils Absolute: 0 10*3/uL (ref 0.0–0.1)
Basophils Relative: 0 % (ref 0–1)
EOS PCT: 4 % (ref 0–5)
Eosinophils Absolute: 0.5 10*3/uL (ref 0.0–0.7)
HEMATOCRIT: 40.4 % (ref 36.0–46.0)
HEMOGLOBIN: 14 g/dL (ref 12.0–15.0)
Hepatitis B Surface Ag: NEGATIVE
LYMPHS ABS: 2.1 10*3/uL (ref 0.7–4.0)
LYMPHS PCT: 18 % (ref 12–46)
MCH: 29.5 pg (ref 26.0–34.0)
MCHC: 34.7 g/dL (ref 30.0–36.0)
MCV: 85.2 fL (ref 78.0–100.0)
MONO ABS: 0.7 10*3/uL (ref 0.1–1.0)
MONOS PCT: 6 % (ref 3–12)
NEUTROS ABS: 8.3 10*3/uL — AB (ref 1.7–7.7)
Neutrophils Relative %: 72 % (ref 43–77)
Platelets: 284 10*3/uL (ref 150–400)
RBC: 4.74 MIL/uL (ref 3.87–5.11)
RDW: 13.8 % (ref 11.5–15.5)
Rh Type: POSITIVE
Rubella: 0.47 Index (ref <0.90)
WBC: 11.5 10*3/uL — AB (ref 4.0–10.5)

## 2013-09-30 LAB — CULTURE, OB URINE

## 2013-10-05 ENCOUNTER — Encounter: Payer: Self-pay | Admitting: Family

## 2013-10-10 ENCOUNTER — Ambulatory Visit (HOSPITAL_COMMUNITY)
Admission: RE | Admit: 2013-10-10 | Discharge: 2013-10-10 | Disposition: A | Discharge status: Home / Self Care | Payer: Medicaid Other | Source: Ambulatory Visit | Attending: Family Medicine | Admitting: Family Medicine

## 2013-10-10 ENCOUNTER — Other Ambulatory Visit: Payer: Self-pay

## 2013-10-10 ENCOUNTER — Encounter: Payer: Self-pay | Admitting: Family

## 2013-10-10 ENCOUNTER — Ambulatory Visit (HOSPITAL_COMMUNITY)
Admission: RE | Admit: 2013-10-10 | Discharge: 2013-10-10 | Disposition: A | Discharge status: Home / Self Care | Payer: Medicaid Other | Source: Ambulatory Visit | Attending: Family | Admitting: Family

## 2013-10-10 VITALS — BP 116/74 | HR 83 | Wt 161.0 lb

## 2013-10-10 DIAGNOSIS — O2340 Unspecified infection of urinary tract in pregnancy, unspecified trimester: Secondary | ICD-10-CM

## 2013-10-10 DIAGNOSIS — O351XX0 Maternal care for (suspected) chromosomal abnormality in fetus, not applicable or unspecified: Secondary | ICD-10-CM | POA: Insufficient documentation

## 2013-10-10 DIAGNOSIS — Z3490 Encounter for supervision of normal pregnancy, unspecified, unspecified trimester: Secondary | ICD-10-CM

## 2013-10-10 DIAGNOSIS — O3510X0 Maternal care for (suspected) chromosomal abnormality in fetus, unspecified, not applicable or unspecified: Secondary | ICD-10-CM | POA: Insufficient documentation

## 2013-10-10 DIAGNOSIS — Z3689 Encounter for other specified antenatal screening: Secondary | ICD-10-CM | POA: Insufficient documentation

## 2013-10-19 DIAGNOSIS — Z369 Encounter for antenatal screening, unspecified: Secondary | ICD-10-CM | POA: Insufficient documentation

## 2013-10-26 ENCOUNTER — Encounter: Payer: Self-pay | Admitting: Family Medicine

## 2013-10-26 ENCOUNTER — Ambulatory Visit (INDEPENDENT_AMBULATORY_CARE_PROVIDER_SITE_OTHER): Payer: Medicaid Other | Admitting: Family Medicine

## 2013-10-26 VITALS — BP 106/70 | Temp 97.2°F | Wt 162.3 lb

## 2013-10-26 DIAGNOSIS — Z3480 Encounter for supervision of other normal pregnancy, unspecified trimester: Secondary | ICD-10-CM

## 2013-10-26 DIAGNOSIS — Z36 Encounter for antenatal screening of mother: Secondary | ICD-10-CM

## 2013-10-26 DIAGNOSIS — Z3490 Encounter for supervision of normal pregnancy, unspecified, unspecified trimester: Secondary | ICD-10-CM

## 2013-10-26 DIAGNOSIS — Z369 Encounter for antenatal screening, unspecified: Secondary | ICD-10-CM

## 2013-10-26 LAB — POCT URINALYSIS DIP (DEVICE)
BILIRUBIN URINE: NEGATIVE
Glucose, UA: 100 mg/dL — AB
Hgb urine dipstick: NEGATIVE
KETONES UR: NEGATIVE mg/dL
Nitrite: NEGATIVE
Protein, ur: NEGATIVE mg/dL
SPECIFIC GRAVITY, URINE: 1.02 (ref 1.005–1.030)
Urobilinogen, UA: 0.2 mg/dL (ref 0.0–1.0)
pH: 7 (ref 5.0–8.0)

## 2013-10-26 NOTE — Patient Instructions (Signed)
Second Trimester of Pregnancy The second trimester is from week 13 through week 28, months 4 through 6. The second trimester is often a time when you feel your best. Your body has also adjusted to being pregnant, and you begin to feel better physically. Usually, morning sickness has lessened or quit completely, you may have more energy, and you may have an increase in appetite. The second trimester is also a time when the fetus is growing rapidly. At the end of the sixth month, the fetus is about 9 inches long and weighs about 1 pounds. You will likely begin to feel the baby move (quickening) between 18 and 20 weeks of the pregnancy. BODY CHANGES Your body goes through many changes during pregnancy. The changes vary from woman to woman.   Your weight will continue to increase. You will notice your lower abdomen bulging out.  You may begin to get stretch marks on your hips, abdomen, and breasts.  You may develop headaches that can be relieved by medicines approved by your caregiver.  You may urinate more often because the fetus is pressing on your bladder.  You may develop or continue to have heartburn as a result of your pregnancy.  You may develop constipation because certain hormones are causing the muscles that push waste through your intestines to slow down.  You may develop hemorrhoids or swollen, bulging veins (varicose veins).  You may have back pain because of the weight gain and pregnancy hormones relaxing your joints between the bones in your pelvis and as a result of a shift in weight and the muscles that support your balance.  Your breasts will continue to grow and be tender.  Your gums may bleed and may be sensitive to brushing and flossing.  Dark spots or blotches (chloasma, mask of pregnancy) may develop on your face. This will likely fade after the baby is born.  A dark line from your belly button to the pubic area (linea nigra) may appear. This will likely fade after the  baby is born. WHAT TO EXPECT AT YOUR PRENATAL VISITS During a routine prenatal visit:  You will be weighed to make sure you and the fetus are growing normally.  Your blood pressure will be taken.  Your abdomen will be measured to track your baby's growth.  The fetal heartbeat will be listened to.  Any test results from the previous visit will be discussed. Your caregiver may ask you:  How you are feeling.  If you are feeling the baby move.  If you have had any abnormal symptoms, such as leaking fluid, bleeding, severe headaches, or abdominal cramping.  If you have any questions. Other tests that may be performed during your second trimester include:  Blood tests that check for:  Low iron levels (anemia).  Gestational diabetes (between 24 and 28 weeks).  Rh antibodies.  Urine tests to check for infections, diabetes, or protein in the urine.  An ultrasound to confirm the proper growth and development of the baby.  An amniocentesis to check for possible genetic problems.  Fetal screens for spina bifida and Down syndrome. HOME CARE INSTRUCTIONS   Avoid all smoking, herbs, alcohol, and unprescribed drugs. These chemicals affect the formation and growth of the baby.  Follow your caregiver's instructions regarding medicine use. There are medicines that are either safe or unsafe to take during pregnancy.  Exercise only as directed by your caregiver. Experiencing uterine cramps is a good sign to stop exercising.  Continue to eat regular,   healthy meals.  Wear a good support bra for breast tenderness.  Do not use hot tubs, steam rooms, or saunas.  Wear your seat belt at all times when driving.  Avoid raw meat, uncooked cheese, cat litter boxes, and soil used by cats. These carry germs that can cause birth defects in the baby.  Take your prenatal vitamins.  Try taking a stool softener (if your caregiver approves) if you develop constipation. Eat more high-fiber foods,  such as fresh vegetables or fruit and whole grains. Drink plenty of fluids to keep your urine clear or pale yellow.  Take warm sitz baths to soothe any pain or discomfort caused by hemorrhoids. Use hemorrhoid cream if your caregiver approves.  If you develop varicose veins, wear support hose. Elevate your feet for 15 minutes, 3 4 times a day. Limit salt in your diet.  Avoid heavy lifting, wear low heel shoes, and practice good posture.  Rest with your legs elevated if you have leg cramps or low back pain.  Visit your dentist if you have not gone yet during your pregnancy. Use a soft toothbrush to brush your teeth and be gentle when you floss.  A sexual relationship may be continued unless your caregiver directs you otherwise.  Continue to go to all your prenatal visits as directed by your caregiver. SEEK MEDICAL CARE IF:   You have dizziness.  You have mild pelvic cramps, pelvic pressure, or nagging pain in the abdominal area.  You have persistent nausea, vomiting, or diarrhea.  You have a bad smelling vaginal discharge.  You have pain with urination. SEEK IMMEDIATE MEDICAL CARE IF:   You have a fever.  You are leaking fluid from your vagina.  You have spotting or bleeding from your vagina.  You have severe abdominal cramping or pain.  You have rapid weight gain or loss.  You have shortness of breath with chest pain.  You notice sudden or extreme swelling of your face, hands, ankles, feet, or legs.  You have not felt your baby move in over an hour.  You have severe headaches that do not go away with medicine.  You have vision changes. Document Released: 07/22/2001 Document Revised: 03/30/2013 Document Reviewed: 09/28/2012 [employer] Patient Information 2014 [employer].  

## 2013-10-26 NOTE — Progress Notes (Signed)
S: 28 yo G1 @ 2164w6d here for ROBV - having some occasional pelvic pain  - no vb, lof. Ctx  O: see flowsheet  A/P - doing well. No concerns.  - msk pain- discussed management - AFP ordered - US to be ordered next time.

## 2013-10-26 NOTE — Progress Notes (Signed)
Pulse- 72  Pain-pelvic

## 2013-10-28 ENCOUNTER — Other Ambulatory Visit: Payer: Medicaid Other

## 2013-10-28 DIAGNOSIS — E349 Endocrine disorder, unspecified: Secondary | ICD-10-CM

## 2013-10-28 DIAGNOSIS — Z369 Encounter for antenatal screening, unspecified: Secondary | ICD-10-CM

## 2013-10-31 ENCOUNTER — Encounter: Payer: Self-pay | Admitting: Family Medicine

## 2013-10-31 LAB — AFP, QUAD SCREEN
AFP: 22.7 IU/mL
Age Alone: 1:819 {titer}
Curr Gest Age: 15.6 wks.days
HCG TOTAL: 56779 m[IU]/mL
INH: 173.9 pg/mL
Interpretation-AFP: NEGATIVE
MoM for AFP: 0.8
MoM for INH: 0.98
MoM for hCG: 2.33
OPEN SPINA BIFIDA: NEGATIVE
TRI 18 SCR RISK EST: NEGATIVE
Trisomy 18 (Edward) Syndrome Interp.: <1:31200 {titer}
UE3 MOM: 2.12
UE3 VALUE: 0.9 ng/mL

## 2013-11-01 ENCOUNTER — Telehealth: Payer: Self-pay

## 2013-11-01 NOTE — Telephone Encounter (Signed)
Attempted to call pt. No answer. Left message stating we are calling with results, non-urgent, call clinic at your convenience; if you would like us to leave information over voicemail please call and leave a message stating it is OK to do so.

## 2013-11-01 NOTE — Telephone Encounter (Signed)
Message copied by Louanna RawAMPBELL, Lucillia Corson M on Tue Nov 01, 2013  8:32 AM ------      Message from: Vale HavenBECK, KELI L      Created: Mon Oct 31, 2013  5:32 PM       Please let her know her quad is neg. Thanks! ------

## 2013-11-03 NOTE — Telephone Encounter (Signed)
Called patient and informed her of normal quad screen.

## 2013-11-07 ENCOUNTER — Inpatient Hospital Stay (HOSPITAL_COMMUNITY)
Admission: AD | Admit: 2013-11-07 | Discharge: 2013-11-07 | Disposition: A | Discharge status: Home / Self Care | Payer: Medicaid Other | Source: Ambulatory Visit | Attending: Obstetrics & Gynecology | Admitting: Obstetrics & Gynecology

## 2013-11-07 ENCOUNTER — Encounter (HOSPITAL_COMMUNITY): Payer: Self-pay | Admitting: *Deleted

## 2013-11-07 DIAGNOSIS — Z87891 Personal history of nicotine dependence: Secondary | ICD-10-CM | POA: Insufficient documentation

## 2013-11-07 DIAGNOSIS — R5381 Other malaise: Secondary | ICD-10-CM

## 2013-11-07 DIAGNOSIS — N390 Urinary tract infection, site not specified: Secondary | ICD-10-CM

## 2013-11-07 DIAGNOSIS — O2390 Unspecified genitourinary tract infection in pregnancy, unspecified trimester: Secondary | ICD-10-CM | POA: Insufficient documentation

## 2013-11-07 DIAGNOSIS — Z833 Family history of diabetes mellitus: Secondary | ICD-10-CM | POA: Insufficient documentation

## 2013-11-07 LAB — URINALYSIS, ROUTINE W REFLEX MICROSCOPIC
BILIRUBIN URINE: NEGATIVE
Glucose, UA: 250 mg/dL — AB
Hgb urine dipstick: NEGATIVE
KETONES UR: NEGATIVE mg/dL
Nitrite: POSITIVE — AB
Protein, ur: NEGATIVE mg/dL
Specific Gravity, Urine: 1.02 (ref 1.005–1.030)
UROBILINOGEN UA: 0.2 mg/dL (ref 0.0–1.0)
pH: 6.5 (ref 5.0–8.0)

## 2013-11-07 LAB — BASIC METABOLIC PANEL
BUN: 8 mg/dL (ref 6–23)
CO2: 23 meq/L (ref 19–32)
CREATININE: 0.53 mg/dL (ref 0.50–1.10)
Calcium: 9.2 mg/dL (ref 8.4–10.5)
Chloride: 99 mEq/L (ref 96–112)
GFR calc Af Amer: >90 mL/min (ref >90)
GFR calc non Af Amer: >90 mL/min (ref >90)
Glucose, Bld: 91 mg/dL (ref 70–99)
Potassium: 3.9 mEq/L (ref 3.7–5.3)
Sodium: 133 mEq/L — ABNORMAL LOW (ref 137–147)

## 2013-11-07 LAB — CBC
HCT: 35.9 % — ABNORMAL LOW (ref 36.0–46.0)
Hemoglobin: 12.3 g/dL (ref 12.0–15.0)
MCH: 30.4 pg (ref 26.0–34.0)
MCHC: 34.3 g/dL (ref 30.0–36.0)
MCV: 88.6 fL (ref 78.0–100.0)
PLATELETS: 242 10*3/uL (ref 150–400)
RBC: 4.05 MIL/uL (ref 3.87–5.11)
RDW: 13.7 % (ref 11.5–15.5)
WBC: 20.6 10*3/uL — ABNORMAL HIGH (ref 4.0–10.5)

## 2013-11-07 LAB — URINE MICROSCOPIC-ADD ON

## 2013-11-07 MED ORDER — SULFAMETHOXAZOLE-TMP DS 800-160 MG PO TABS: 1.0000 | ORAL_TABLET | Freq: Two times a day (BID) | ORAL | Status: DC

## 2013-11-07 NOTE — Progress Notes (Signed)
Not in lobby when called 

## 2013-11-07 NOTE — Discharge Instructions (Signed)
Urinary Tract Infection  Urinary tract infections (UTIs) can develop anywhere along your urinary tract. Your urinary tract is your body's drainage system for removing wastes and extra water. Your urinary tract includes two kidneys, two ureters, a bladder, and a urethra. Your kidneys are a pair of bean-shaped organs. Each kidney is about the size of your fist. They are located below your ribs, one on each side of your spine.  CAUSES  Infections are caused by microbes, which are microscopic organisms, including fungi, viruses, and bacteria. These organisms are so small that they can only be seen through a microscope. Bacteria are the microbes that most commonly cause UTIs.  SYMPTOMS   Symptoms of UTIs may vary by age and gender of the patient and by the location of the infection. Symptoms in young women typically include a frequent and intense urge to urinate and a painful, burning feeling in the bladder or urethra during urination. Older women and men are more likely to be tired, shaky, and weak and have muscle aches and abdominal pain. A fever may mean the infection is in your kidneys. Other symptoms of a kidney infection include pain in your back or sides below the ribs, nausea, and vomiting.  DIAGNOSIS  To diagnose a UTI, your caregiver will ask you about your symptoms. Your caregiver also will ask to provide a urine sample. The urine sample will be tested for bacteria and white blood cells. White blood cells are made by your body to help fight infection.  TREATMENT   Typically, UTIs can be treated with medication. Because most UTIs are caused by a bacterial infection, they usually can be treated with the use of antibiotics. The choice of antibiotic and length of treatment depend on your symptoms and the type of bacteria causing your infection.  HOME CARE INSTRUCTIONS   If you were prescribed antibiotics, take them exactly as your caregiver instructs you. Finish the medication even if you feel better after you  have only taken some of the medication.   Drink enough water and fluids to keep your urine clear or pale yellow.   Avoid caffeine, tea, and carbonated beverages. They tend to irritate your bladder.   Empty your bladder often. Avoid holding urine for long periods of time.   Empty your bladder before and after sexual intercourse.   After a bowel movement, women should cleanse from front to back. Use each tissue only once.  SEEK MEDICAL CARE IF:    You have back pain.   You develop a fever.   Your symptoms do not begin to resolve within 3 days.  SEEK IMMEDIATE MEDICAL CARE IF:    You have severe back pain or lower abdominal pain.   You develop chills.   You have nausea or vomiting.   You have continued burning or discomfort with urination.  MAKE SURE YOU:    Understand these instructions.   Will watch your condition.   Will get help right away if you are not doing well or get worse.  Document Released: 05/07/2005 Document Revised: 01/27/2012 Document Reviewed: 09/05/2011  [employer] Patient Information 2014 [employer].

## 2013-11-07 NOTE — MAU Note (Signed)
C/O dizziness, nausea, chills, "head spinning".  Denies bleeding or pain.

## 2013-11-07 NOTE — MAU Provider Note (Signed)
History     CSN: [id number]  Arrival date and time: 11/07/13 1411   None     Chief Complaint  Patient presents with  . Dizziness  . Nausea   HPI This is a 28 y.o. female at [redacted]w[redacted]d who presents from work with c/o feeling of nausea, chills then feeling hot, and "just not feeling right".  Denies any pain. Did not take temperature, came straight from work. Also wants to know if she can have a tooth pulled during pregnancy.   RN Note:  C/O dizziness, nausea, chills, "head spinning". Denies bleeding or pain.       OB History   Grav Para Term Preterm Abortions TAB SAB Ect Mult Living   1 0 0 0 0 0 0 0 0 0       Past Medical History  Diagnosis Date  . Small bowel obstruction     As infant with surgery; recent obstruction 2012    Past Surgical History  Procedure Laterality Date  . Abdominal surgery      Surgery as infant    Family History  Problem Relation Age of Onset  . Diabetes Father     History  Substance Use Topics  . Smoking status: Former Games developer  . Smokeless tobacco: Never Used  . Alcohol Use: No    Allergies:  Allergies  Allergen Reactions  . Penicillins Hives    Prescriptions prior to admission  Medication Sig Dispense Refill  . cephALEXin (KEFLEX) 500 MG capsule Take 1 capsule (500 mg total) by mouth 3 (three) times daily.  21 capsule  0  . Prenatal Vit-Fe Fumarate-FA (PRENATAL MULTIVITAMIN) TABS tablet Take 1 tablet by mouth daily at 12 noon.        Review of Systems  Constitutional: Positive for chills and malaise/fatigue. Negative for fever.  Gastrointestinal: Positive for nausea. Negative for vomiting, abdominal pain, diarrhea and constipation.  Musculoskeletal: Negative for myalgias.  Neurological: Positive for dizziness. Negative for focal weakness and headaches.   Physical Exam   Blood pressure 108/66, pulse 103, temperature 98.4 F (36.9 C), temperature source Oral, resp. rate 18, height 5\' 1"  (1.549 m), weight 75.751 kg (167 lb),  last menstrual period 07/07/2013, SpO2 100.00%. Filed Vitals:   11/07/13 1441 11/07/13 1442 11/07/13 1443 11/07/13 1446  BP: 109/57 107/68 102/55 108/66  Pulse: 91 98 104 103  Temp:      TempSrc:      Resp:      Height:      Weight:      SpO2:        Physical Exam  Constitutional: She is oriented to person, place, and time. She appears well-developed and well-nourished. No distress.  HENT:  Head: Normocephalic.  Neck: Normal range of motion. Neck supple.  Cardiovascular: Normal rate and regular rhythm.  Exam reveals no gallop and no friction rub.   No murmur heard. Respiratory: Effort normal and breath sounds normal. No respiratory distress. She has no wheezes. She has no rales. She exhibits no tenderness.  GI: Soft. She exhibits no distension. There is no tenderness. There is no rebound and no guarding.  Musculoskeletal: Normal range of motion.  Neurological: She is alert and oriented to person, place, and time.  Skin: Skin is warm and dry.  Psychiatric: She has a normal mood and affect.    MAU Course  Procedures  MDM Orthostatic vs normal.  Will check CBC to rule out acute infection.  Results for orders placed during the hospital encounter of  11/07/13 (from the past 24 hour(s))  URINALYSIS, ROUTINE W REFLEX MICROSCOPIC     Status: Abnormal   Collection Time    11/07/13  2:25 PM      Result Value Ref Range   Color, Urine YELLOW  YELLOW   APPearance HAZY (*) CLEAR   Specific Gravity, Urine 1.020  1.005 - 1.030   pH 6.5  5.0 - 8.0   Glucose, UA 250 (*) NEGATIVE mg/dL   Hgb urine dipstick NEGATIVE  NEGATIVE   Bilirubin Urine NEGATIVE  NEGATIVE   Ketones, ur NEGATIVE  NEGATIVE mg/dL   Protein, ur NEGATIVE  NEGATIVE mg/dL   Urobilinogen, UA 0.2  0.0 - 1.0 mg/dL   Nitrite POSITIVE (*) NEGATIVE   Leukocytes, UA MODERATE (*) NEGATIVE  URINE MICROSCOPIC-ADD ON     Status: Abnormal   Collection Time    11/07/13  2:25 PM      Result Value Ref Range   Squamous Epithelial  / LPF MANY (*) RARE   WBC, UA 11-20  <3 WBC/hpf   Bacteria, UA MANY (*) RARE  CBC     Status: Abnormal   Collection Time    11/07/13  3:18 PM      Result Value Ref Range   WBC 20.6 (*) 4.0 - 10.5 K/uL   RBC 4.05  3.87 - 5.11 MIL/uL   Hemoglobin 12.3  12.0 - 15.0 g/dL   HCT 96.035.9 (*) 45.436.0 - 09.846.0 %   MCV 88.6  78.0 - 100.0 fL   MCH 30.4  26.0 - 34.0 pg   MCHC 34.3  30.0 - 36.0 g/dL   RDW 11.913.7  14.711.5 - 82.915.5 %   Platelets 242  150 - 400 K/uL  BASIC METABOLIC PANEL     Status: Abnormal   Collection Time    11/07/13  3:18 PM      Result Value Ref Range   Sodium 133 (*) 137 - 147 mEq/L   Potassium 3.9  3.7 - 5.3 mEq/L   Chloride 99  96 - 112 mEq/L   CO2 23  19 - 32 mEq/L   Glucose, Bld 91  70 - 99 mg/dL   BUN 8  6 - 23 mg/dL   Creatinine, Ser 5.620.53  0.50 - 1.10 mg/dL   Calcium 9.2  8.4 - 13.010.5 mg/dL   GFR calc non Af Amer >90  >90 mL/min   GFR calc Af Amer >90  >90 mL/min    Assessment and Plan  A:  SIUP at 3494w4d        UTI       No evidence of pyelonephritis  P:  Discharge home       Reviewed results        Rx Septra DS due to allergy to PCN        Followup in clinic  [facility],Nahla Lukin 11/07/2013, 3:04 PM

## 2013-11-23 ENCOUNTER — Ambulatory Visit (INDEPENDENT_AMBULATORY_CARE_PROVIDER_SITE_OTHER): Payer: Medicaid Other | Admitting: Obstetrics and Gynecology

## 2013-11-23 VITALS — BP 107/66 | Wt 168.1 lb

## 2013-11-23 DIAGNOSIS — Z3400 Encounter for supervision of normal first pregnancy, unspecified trimester: Secondary | ICD-10-CM

## 2013-11-23 LAB — POCT URINALYSIS DIP (DEVICE)
BILIRUBIN URINE: NEGATIVE
Glucose, UA: 500 mg/dL — AB
HGB URINE DIPSTICK: NEGATIVE
Ketones, ur: NEGATIVE mg/dL
Nitrite: POSITIVE — AB
Protein, ur: NEGATIVE mg/dL
Specific Gravity, Urine: 1.02 (ref 1.005–1.030)
Urobilinogen, UA: 0.2 mg/dL (ref 0.0–1.0)
pH: 5.5 (ref 5.0–8.0)

## 2013-11-23 NOTE — Progress Notes (Signed)
Doing well. Anatomic US scheduled. RLP reviewed.

## 2013-11-23 NOTE — Progress Notes (Signed)
Pulse: 80 Pt has not had anatomy scan yet.

## 2013-11-23 NOTE — Patient Instructions (Signed)
Second Trimester of Pregnancy The second trimester is from week 13 through week 28, months 4 through 6. The second trimester is often a time when you feel your best. Your body has also adjusted to being pregnant, and you begin to feel better physically. Usually, morning sickness has lessened or quit completely, you may have more energy, and you may have an increase in appetite. The second trimester is also a time when the fetus is growing rapidly. At the end of the sixth month, the fetus is about 9 inches long and weighs about 1 pounds. You will likely begin to feel the baby move (quickening) between 18 and 20 weeks of the pregnancy. BODY CHANGES Your body goes through many changes during pregnancy. The changes vary from woman to woman.   Your weight will continue to increase. You will notice your lower abdomen bulging out.  You may begin to get stretch marks on your hips, abdomen, and breasts.  You may develop headaches that can be relieved by medicines approved by your caregiver.  You may urinate more often because the fetus is pressing on your bladder.  You may develop or continue to have heartburn as a result of your pregnancy.  You may develop constipation because certain hormones are causing the muscles that push waste through your intestines to slow down.  You may develop hemorrhoids or swollen, bulging veins (varicose veins).  You may have back pain because of the weight gain and pregnancy hormones relaxing your joints between the bones in your pelvis and as a result of a shift in weight and the muscles that support your balance.  Your breasts will continue to grow and be tender.  Your gums may bleed and may be sensitive to brushing and flossing.  Dark spots or blotches (chloasma, mask of pregnancy) may develop on your face. This will likely fade after the baby is born.  A dark line from your belly button to the pubic area (linea nigra) may appear. This will likely fade after the  baby is born. WHAT TO EXPECT AT YOUR PRENATAL VISITS During a routine prenatal visit:  You will be weighed to make sure you and the fetus are growing normally.  Your blood pressure will be taken.  Your abdomen will be measured to track your baby's growth.  The fetal heartbeat will be listened to.  Any test results from the previous visit will be discussed. Your caregiver may ask you:  How you are feeling.  If you are feeling the baby move.  If you have had any abnormal symptoms, such as leaking fluid, bleeding, severe headaches, or abdominal cramping.  If you have any questions. Other tests that may be performed during your second trimester include:  Blood tests that check for:  Low iron levels (anemia).  Gestational diabetes (between 24 and 28 weeks).  Rh antibodies.  Urine tests to check for infections, diabetes, or protein in the urine.  An ultrasound to confirm the proper growth and development of the baby.  An amniocentesis to check for possible genetic problems.  Fetal screens for spina bifida and Down syndrome. HOME CARE INSTRUCTIONS   Avoid all smoking, herbs, alcohol, and unprescribed drugs. These chemicals affect the formation and growth of the baby.  Follow your caregiver's instructions regarding medicine use. There are medicines that are either safe or unsafe to take during pregnancy.  Exercise only as directed by your caregiver. Experiencing uterine cramps is a good sign to stop exercising.  Continue to eat regular,   healthy meals.  Wear a good support bra for breast tenderness.  Do not use hot tubs, steam rooms, or saunas.  Wear your seat belt at all times when driving.  Avoid raw meat, uncooked cheese, cat litter boxes, and soil used by cats. These carry germs that can cause birth defects in the baby.  Take your prenatal vitamins.  Try taking a stool softener (if your caregiver approves) if you develop constipation. Eat more high-fiber foods,  such as fresh vegetables or fruit and whole grains. Drink plenty of fluids to keep your urine clear or pale yellow.  Take warm sitz baths to soothe any pain or discomfort caused by hemorrhoids. Use hemorrhoid cream if your caregiver approves.  If you develop varicose veins, wear support hose. Elevate your feet for 15 minutes, 3 4 times a day. Limit salt in your diet.  Avoid heavy lifting, wear low heel shoes, and practice good posture.  Rest with your legs elevated if you have leg cramps or low back pain.  Visit your dentist if you have not gone yet during your pregnancy. Use a soft toothbrush to brush your teeth and be gentle when you floss.  A sexual relationship may be continued unless your caregiver directs you otherwise.  Continue to go to all your prenatal visits as directed by your caregiver. SEEK MEDICAL CARE IF:   You have dizziness.  You have mild pelvic cramps, pelvic pressure, or nagging pain in the abdominal area.  You have persistent nausea, vomiting, or diarrhea.  You have a bad smelling vaginal discharge.  You have pain with urination. SEEK IMMEDIATE MEDICAL CARE IF:   You have a fever.  You are leaking fluid from your vagina.  You have spotting or bleeding from your vagina.  You have severe abdominal cramping or pain.  You have rapid weight gain or loss.  You have shortness of breath with chest pain.  You notice sudden or extreme swelling of your face, hands, ankles, feet, or legs.  You have not felt your baby move in over an hour.  You have severe headaches that do not go away with medicine.  You have vision changes. Document Released: 07/22/2001 Document Revised: 03/30/2013 Document Reviewed: 09/28/2012 [employer] Patient Information 2014 [employer].  

## 2013-11-24 ENCOUNTER — Ambulatory Visit (HOSPITAL_COMMUNITY)
Admission: RE | Admit: 2013-11-24 | Discharge: 2013-11-24 | Disposition: A | Discharge status: Home / Self Care | Payer: Medicaid Other | Source: Ambulatory Visit | Attending: Obstetrics and Gynecology | Admitting: Obstetrics and Gynecology

## 2013-11-24 DIAGNOSIS — Z3490 Encounter for supervision of normal pregnancy, unspecified, unspecified trimester: Secondary | ICD-10-CM

## 2013-11-24 DIAGNOSIS — Z3400 Encounter for supervision of normal first pregnancy, unspecified trimester: Secondary | ICD-10-CM

## 2013-11-24 DIAGNOSIS — Z3689 Encounter for other specified antenatal screening: Secondary | ICD-10-CM | POA: Insufficient documentation

## 2013-12-12 ENCOUNTER — Telehealth: Payer: Self-pay | Admitting: General Practice

## 2013-12-12 NOTE — Telephone Encounter (Signed)
Patient called in to front office stating she has swelling in her feet and ankles more so in her left foot and she just wanted to make sure everything was okay. Told patient that doesn't immediately sound concerning and that we would be more concerned if she was having swelling and pain in the back of her calf. Told patient if it gets worse and she is concerned about it she can go to MAU for evaluation. Patient verbalized understanding and had no further questions

## 2013-12-21 ENCOUNTER — Ambulatory Visit (INDEPENDENT_AMBULATORY_CARE_PROVIDER_SITE_OTHER): Payer: Medicaid Other | Admitting: Family Medicine

## 2013-12-21 VITALS — BP 113/63 | HR 84 | Temp 96.9°F | Wt 175.2 lb

## 2013-12-21 DIAGNOSIS — Z3490 Encounter for supervision of normal pregnancy, unspecified, unspecified trimester: Secondary | ICD-10-CM

## 2013-12-21 DIAGNOSIS — N390 Urinary tract infection, site not specified: Secondary | ICD-10-CM

## 2013-12-21 DIAGNOSIS — Z3480 Encounter for supervision of other normal pregnancy, unspecified trimester: Secondary | ICD-10-CM

## 2013-12-21 LAB — POCT URINALYSIS DIP (DEVICE)
Bilirubin Urine: NEGATIVE
Glucose, UA: 100 mg/dL — AB
Hgb urine dipstick: NEGATIVE
KETONES UR: NEGATIVE mg/dL
Nitrite: POSITIVE — AB
PROTEIN: NEGATIVE mg/dL
SPECIFIC GRAVITY, URINE: 1.02 (ref 1.005–1.030)
Urobilinogen, UA: 0.2 mg/dL (ref 0.0–1.0)
pH: 6.5 (ref 5.0–8.0)

## 2013-12-21 MED ORDER — NITROFURANTOIN MONOHYD MACRO 100 MG PO CAPS: 100.0000 mg | ORAL_CAPSULE | Freq: Two times a day (BID) | ORAL | Status: AC

## 2013-12-21 NOTE — Addendum Note (Signed)
Addended by: Gerome ApleyZEYFANG, Arran Fessel L on: 12/21/2013 09:01 AM   Modules accepted: Orders

## 2013-12-21 NOTE — Progress Notes (Signed)
+  FM, no lof, no vb, no ctx Right foot swelling. No extension to foot, neg homans Hx of UTI - persistent Nitrite and LE - tx macrobid  Jamelle RushingSerita Lane is a 28 y.o. G1P0000 at 1164w6d here for ROB visit.  Discussed with Patient:  -Plans to breast feed.  All questions answered. -Continue prenatal vitamins. - Reviewed genetics screen (Quad screen / first trimester screen / serum integrated screen / full integrated screen done/ not done).   -Reviewed fetal kick counts (Pt to perform daily at a time when the baby is active, lie laterally with both hands on belly in quiet room and count all movements (hiccups, shoulder rolls, obvious kicks, etc); pt is to report to clinic or MAU for less than 10 movements felt in a one hour time period-pt told as soon as she counts 10 movements the count is complete.)  - Routine precautions discussed (depression, infection s/s).   Patient provided with all pertinent phone numbers for emergencies. - RTC for any VB, regular, painful cramps/ctxs occurring at a rate of >2/10 min, fever (100.5 or higher), n/v/d, any pain that is unresolving or worsening, LOF, decreased fetal movement, CP, SOB, edema  Problems: Patient Active Problem List   Diagnosis Date Noted  . First trimester screening 10/19/2013  . Supervision of normal pregnancy 09/28/2013  . UTI (urinary tract infection) during pregnancy 09/28/2013  . Small bowel obstruction     To Do:   [ ]  Vaccines: Flu: dec Tdap:  [ ]  BCM: undeced  Edu: [ ]  PTL precautions; [ ]  BF class; [ ]  childbirth class; [ ]   BF counseling;

## 2013-12-21 NOTE — Patient Instructions (Signed)
Second Trimester of Pregnancy The second trimester is from week 13 through week 28, months 4 through 6. The second trimester is often a time when you feel your best. Your body has also adjusted to being pregnant, and you begin to feel better physically. Usually, morning sickness has lessened or quit completely, you may have more energy, and you may have an increase in appetite. The second trimester is also a time when the fetus is growing rapidly. At the end of the sixth month, the fetus is about 9 inches long and weighs about 1 pounds. You will likely begin to feel the baby move (quickening) between 18 and 20 weeks of the pregnancy. BODY CHANGES Your body goes through many changes during pregnancy. The changes vary from woman to woman.   Your weight will continue to increase. You will notice your lower abdomen bulging out.  You may begin to get stretch marks on your hips, abdomen, and breasts.  You may develop headaches that can be relieved by medicines approved by your caregiver.  You may urinate more often because the fetus is pressing on your bladder.  You may develop or continue to have heartburn as a result of your pregnancy.  You may develop constipation because certain hormones are causing the muscles that push waste through your intestines to slow down.  You may develop hemorrhoids or swollen, bulging veins (varicose veins).  You may have back pain because of the weight gain and pregnancy hormones relaxing your joints between the bones in your pelvis and as a result of a shift in weight and the muscles that support your balance.  Your breasts will continue to grow and be tender.  Your gums may bleed and may be sensitive to brushing and flossing.  Dark spots or blotches (chloasma, mask of pregnancy) may develop on your face. This will likely fade after the baby is born.  A dark line from your belly button to the pubic area (linea nigra) may appear. This will likely fade after the  baby is born. WHAT TO EXPECT AT YOUR PRENATAL VISITS During a routine prenatal visit:  You will be weighed to make sure you and the fetus are growing normally.  Your blood pressure will be taken.  Your abdomen will be measured to track your baby's growth.  The fetal heartbeat will be listened to.  Any test results from the previous visit will be discussed. Your caregiver may ask you:  How you are feeling.  If you are feeling the baby move.  If you have had any abnormal symptoms, such as leaking fluid, bleeding, severe headaches, or abdominal cramping.  If you have any questions. Other tests that may be performed during your second trimester include:  Blood tests that check for:  Low iron levels (anemia).  Gestational diabetes (between 24 and 28 weeks).  Rh antibodies.  Urine tests to check for infections, diabetes, or protein in the urine.  An ultrasound to confirm the proper growth and development of the baby.  An amniocentesis to check for possible genetic problems.  Fetal screens for spina bifida and Down syndrome. HOME CARE INSTRUCTIONS   Avoid all smoking, herbs, alcohol, and unprescribed drugs. These chemicals affect the formation and growth of the baby.  Follow your caregiver's instructions regarding medicine use. There are medicines that are either safe or unsafe to take during pregnancy.  Exercise only as directed by your caregiver. Experiencing uterine cramps is a good sign to stop exercising.  Continue to eat regular,   healthy meals.  Wear a good support bra for breast tenderness.  Do not use hot tubs, steam rooms, or saunas.  Wear your seat belt at all times when driving.  Avoid raw meat, uncooked cheese, cat litter boxes, and soil used by cats. These carry germs that can cause birth defects in the baby.  Take your prenatal vitamins.  Try taking a stool softener (if your caregiver approves) if you develop constipation. Eat more high-fiber foods,  such as fresh vegetables or fruit and whole grains. Drink plenty of fluids to keep your urine clear or pale yellow.  Take warm sitz baths to soothe any pain or discomfort caused by hemorrhoids. Use hemorrhoid cream if your caregiver approves.  If you develop varicose veins, wear support hose. Elevate your feet for 15 minutes, 3 4 times a day. Limit salt in your diet.  Avoid heavy lifting, wear low heel shoes, and practice good posture.  Rest with your legs elevated if you have leg cramps or low back pain.  Visit your dentist if you have not gone yet during your pregnancy. Use a soft toothbrush to brush your teeth and be gentle when you floss.  A sexual relationship may be continued unless your caregiver directs you otherwise.  Continue to go to all your prenatal visits as directed by your caregiver. SEEK MEDICAL CARE IF:   You have dizziness.  You have mild pelvic cramps, pelvic pressure, or nagging pain in the abdominal area.  You have persistent nausea, vomiting, or diarrhea.  You have a bad smelling vaginal discharge.  You have pain with urination. SEEK IMMEDIATE MEDICAL CARE IF:   You have a fever.  You are leaking fluid from your vagina.  You have spotting or bleeding from your vagina.  You have severe abdominal cramping or pain.  You have rapid weight gain or loss.  You have shortness of breath with chest pain.  You notice sudden or extreme swelling of your face, hands, ankles, feet, or legs.  You have not felt your baby move in over an hour.  You have severe headaches that do not go away with medicine.  You have vision changes. Document Released: 07/22/2001 Document Revised: 03/30/2013 Document Reviewed: 09/28/2012 [employer] Patient Information 2014 [employer].  

## 2013-12-24 LAB — CULTURE, OB URINE: Colony Count: >=100000

## 2014-01-16 ENCOUNTER — Encounter (HOSPITAL_COMMUNITY): Payer: Self-pay | Admitting: General Practice

## 2014-01-16 ENCOUNTER — Inpatient Hospital Stay (HOSPITAL_COMMUNITY)
Admission: AD | Admit: 2014-01-16 | Discharge: 2014-01-16 | Disposition: A | Discharge status: Home / Self Care | Payer: Medicaid Other | Source: Ambulatory Visit | Attending: Obstetrics and Gynecology | Admitting: Obstetrics and Gynecology

## 2014-01-16 DIAGNOSIS — Z3490 Encounter for supervision of normal pregnancy, unspecified, unspecified trimester: Secondary | ICD-10-CM

## 2014-01-16 DIAGNOSIS — O2340 Unspecified infection of urinary tract in pregnancy, unspecified trimester: Secondary | ICD-10-CM

## 2014-01-16 DIAGNOSIS — M549 Dorsalgia, unspecified: Secondary | ICD-10-CM | POA: Insufficient documentation

## 2014-01-16 DIAGNOSIS — O9989 Other specified diseases and conditions complicating pregnancy, childbirth and the puerperium: Principal | ICD-10-CM

## 2014-01-16 DIAGNOSIS — O99891 Other specified diseases and conditions complicating pregnancy: Secondary | ICD-10-CM | POA: Insufficient documentation

## 2014-01-16 DIAGNOSIS — N898 Other specified noninflammatory disorders of vagina: Secondary | ICD-10-CM | POA: Insufficient documentation

## 2014-01-16 DIAGNOSIS — R109 Unspecified abdominal pain: Secondary | ICD-10-CM | POA: Insufficient documentation

## 2014-01-16 LAB — URINALYSIS, ROUTINE W REFLEX MICROSCOPIC
Bilirubin Urine: NEGATIVE
GLUCOSE, UA: 500 mg/dL — AB
Ketones, ur: NEGATIVE mg/dL
LEUKOCYTES UA: NEGATIVE
Nitrite: POSITIVE — AB
PH: 6 (ref 5.0–8.0)
PROTEIN: NEGATIVE mg/dL
SPECIFIC GRAVITY, URINE: 1.025 (ref 1.005–1.030)
Urobilinogen, UA: 0.2 mg/dL (ref 0.0–1.0)

## 2014-01-16 LAB — WET PREP, GENITAL
Clue Cells Wet Prep HPF POC: NONE SEEN
Trich, Wet Prep: NONE SEEN
YEAST WET PREP: NONE SEEN

## 2014-01-16 LAB — URINE MICROSCOPIC-ADD ON

## 2014-01-16 MED ORDER — CEPHALEXIN 500 MG PO CAPS: 500.0000 mg | ORAL_CAPSULE | Freq: Four times a day (QID) | ORAL | Status: DC

## 2014-01-16 NOTE — MAU Note (Signed)
Patient states she has been having abdominal and back pain for several days, getting worse. States she had a brown this pinkish vaginal discharge several hours after intercourse this am. Reports good fetal movement, denies leaking fluid.

## 2014-01-16 NOTE — Discharge Instructions (Signed)
Pregnancy and Urinary Tract Infection  A urinary tract infection (UTI) is a bacterial infection of the urinary tract. Infection of the urinary tract can include the ureters, kidneys (pyelonephritis), bladder (cystitis), and urethra (urethritis). All pregnant women should be screened for bacteria in the urinary tract. Identifying and treating a UTI will decrease the risk of preterm labor and developing more serious infections in both the mother and baby.  CAUSES  Bacteria germs cause almost all UTIs.   RISK FACTORS  Many factors can increase your chances of getting a UTI during pregnancy. These include:  · Having a short urethra.  · Poor toilet and hygiene habits.  · Sexual intercourse.  · Blockage of urine along the urinary tract.  · Problems with the pelvic muscles or nerves.  · Diabetes.  · Obesity.  · Bladder problems after having several children.  · Previous history of UTI.  SIGNS AND SYMPTOMS   · Pain, burning, or a stinging feeling when urinating.  · Suddenly feeling the need to urinate right away (urgency).  · Loss of bladder control (urinary incontinence).  · Frequent urination, more than is common with pregnancy.  · Lower abdominal or back discomfort.  · Cloudy urine.  · Blood in the urine (hematuria).  · Fever.   When the kidneys are infected, the symptoms may be:  · Back pain.  · Flank pain on the right side more so than the left.  · Fever.  · Chills.  · Nausea.  · Vomiting.  DIAGNOSIS   A urinary tract infection is usually diagnosed through urine tests. Additional tests and procedures are sometimes done. These may include:  · Ultrasound exam of the kidneys, ureters, bladder, and urethra.  · Looking in the bladder with a lighted tube (cystoscopy).  TREATMENT  Typically, UTIs can be treated with antibiotic medicines.   HOME CARE INSTRUCTIONS   · Only take over-the-counter or prescription medicines as directed by your health care provider. If you were prescribed antibiotics, take them as directed. Finish  them even if you start to feel better.  · Drink enough fluids to keep your urine clear or pale yellow.  · Do not have sexual intercourse until the infection is gone and your health care provider says it is okay.  · Make sure you are tested for UTIs throughout your pregnancy. These infections often come back.   Preventing a UTI in the Future  · Practice good toilet habits. Always wipe from front to back. Use the tissue only once.  · Do not hold your urine. Empty your bladder as soon as possible when the urge comes.  · Do not douche or use deodorant sprays.  · Wash with soap and warm water around the genital area and the anus.  · Empty your bladder before and after sexual intercourse.  · Wear underwear with a cotton crotch.  · Avoid caffeine and carbonated drinks. They can irritate the bladder.  · Drink cranberry juice or take cranberry pills. This may decrease the risk of getting a UTI.  · Do not drink alcohol.  · Keep all your appointments and tests as scheduled.   SEEK MEDICAL CARE IF:   · Your symptoms get worse.  · You are still having fevers 2 or more days after treatment begins.  · You have a rash.  · You feel that you are having problems with medicines prescribed.  · You have abnormal vaginal discharge.  SEEK IMMEDIATE MEDICAL CARE IF:   · You have back or flank   pain.  · You have chills.  · You have blood in your urine.  · You have nausea and vomiting.  · You have contractions of your uterus.  · You have a gush of fluid from the vagina.  MAKE SURE YOU:  · Understand these instructions.    · Will watch your condition.    · Will get help right away if you are not doing well or get worse.    Document Released: 11/22/2010 Document Revised: 05/18/2013 Document Reviewed: 02/24/2013  ExitCare® Patient Information ©2014 [employer].

## 2014-01-16 NOTE — MAU Provider Note (Signed)
@MAUPATCONTACT @  Chief Complaint:  Abdominal Cramping and Vaginal Discharge   Suzanne Lane is  28 y.o. G1P0000 at [redacted]w[redacted]d presents complaining of Abdominal Cramping and Vaginal Discharge .  She si also complaining of persistent UTI symptoms, recently treated with Macrobid for [facility] UTI. She reports abdominal pain in lower abdomen bilaterally described as occasional cramping every few hours over the past 2 - 3 days but worse today, has not taken meds/Tylenol for her pain. No CTX/LOF. +FM. She had intercourse this morning and few hours after his she wiped and noticed brown discharge with pink bloody streaks, no frank bleeding, no passage of clots. No hx STI, no new sexual partners since this pregnancy, monogamous.   Obstetrical/Gynecological History: OB History   Grav Para Term Preterm Abortions TAB SAB Ect Mult Living   1 0 0 0 0 0 0 0 0 0      Past Medical History: Past Medical History  Diagnosis Date  . Small bowel obstruction     As infant with surgery; recent obstruction 2012    Past Surgical History: Past Surgical History  Procedure Laterality Date  . Abdominal surgery      Surgery as infant    Family History: Family History  Problem Relation Age of Onset  . Diabetes Father     Social History: History  Substance Use Topics  . Smoking status: Former Games developer  . Smokeless tobacco: Never Used  . Alcohol Use: No    Allergies:  Allergies  Allergen Reactions  . Penicillins Hives    Meds:  No prescriptions prior to admission    Review of Systems -   Review of Systems  Constitutional: Negative for fever, chills   Eyes: Negative for blurred vision Respiratory: Negative forshortness of breath   Cardiovascular: Negative for chest pain, palpitations, orthopnea,  leg swelling  Gastrointestinal: Negative for heartburn, nausea, vomiting, diarrhea, constipation, blood in stool. Abdominal cramping as noted in HPI Genitourinary: Postitive for dysuria, urgency,  frequency,negative for hematuria and flank pain.  Musculoskeletal: Negative for myalgias, joint pain and falls. Postive for lower back pain several days Skin: Negative for rash.  Neurological: Negative for dizziness, tingling, tremors, sensory change, speech change, focal weakness, seizures, loss of consciousness, weakness and headaches.    Physical Exam  Blood pressure 121/66, pulse 86, temperature 99.1 F (37.3 C), temperature source Oral, resp. rate 18, height 5' 2.5" (1.588 m), weight 81.557 kg (179 lb 12.8 oz), last menstrual period 07/07/2013, SpO2 98.00%. GENERAL: Well-developed, well-nourished female in no acute distress.  LUNGS: Clear to auscultation bilaterally.  HEART: Regular rate and rhythm. ABDOMEN: Soft, nontender, nondistended, gravid.  EXTREMITIES: Nontender, no edema, 2+ distal pulses. CERVICAL EXAM: Dilatation 0 cm   Effacement 0%   Station -3 Presentation: unable to determine FHT:  Baseline rate 140 bpm   Variability moderate  Accelerations absent Decelerations absent Contractions: None   Labs: No results found for this or any previous visit (from the past 24 hour(s)). Imaging Studies:  No results found.  Assessment/Plan: [name] is  28 y.o. G1P0000 at [redacted]w[redacted]d presents with abdominal cramping, vaginal spotting/discharge, UTI symptoms.  Abdominal pain/cramping -consistent with normal pregnancy, pt counseled on labor precautions, return for eval if regular CTX, if LOF. Return for eval also if decreased/absent fetal movement or vaginal bleeding. Advised that intercourse may worsen cramping, cause spotting, or affect her discharge.   Urinary urgency/frequency, recent tx UTI, UA positive - will treat for UTI, send this urine sample for culture. Counseled that UTI can also  be contributing to her abdominal pain.   Abnormal vaginal discharge/spotting - likely due to recent intercourse. Advised that intercourse may worsen cramping, cause spotting, or affect her  discharge. Wet prep   Sunnie Nielsenlexander, Blythe Veach 6/28/20158:49 PM

## 2014-01-18 ENCOUNTER — Encounter: Payer: Self-pay | Admitting: Obstetrics and Gynecology

## 2014-01-18 ENCOUNTER — Ambulatory Visit (INDEPENDENT_AMBULATORY_CARE_PROVIDER_SITE_OTHER): Payer: Medicaid Other | Admitting: Obstetrics and Gynecology

## 2014-01-18 VITALS — BP 111/71 | HR 96 | Wt 178.4 lb

## 2014-01-18 DIAGNOSIS — N390 Urinary tract infection, site not specified: Secondary | ICD-10-CM

## 2014-01-18 DIAGNOSIS — Z3480 Encounter for supervision of other normal pregnancy, unspecified trimester: Secondary | ICD-10-CM

## 2014-01-18 DIAGNOSIS — Z23 Encounter for immunization: Secondary | ICD-10-CM

## 2014-01-18 DIAGNOSIS — Z3490 Encounter for supervision of normal pregnancy, unspecified, unspecified trimester: Secondary | ICD-10-CM

## 2014-01-18 DIAGNOSIS — O2340 Unspecified infection of urinary tract in pregnancy, unspecified trimester: Secondary | ICD-10-CM

## 2014-01-18 DIAGNOSIS — O2390 Unspecified genitourinary tract infection in pregnancy, unspecified trimester: Secondary | ICD-10-CM

## 2014-01-18 LAB — POCT URINALYSIS DIP (DEVICE)
Bilirubin Urine: NEGATIVE
Glucose, UA: 100 mg/dL — AB
Hgb urine dipstick: NEGATIVE
Ketones, ur: NEGATIVE mg/dL
LEUKOCYTES UA: NEGATIVE
Nitrite: NEGATIVE
PH: 7 (ref 5.0–8.0)
PROTEIN: NEGATIVE mg/dL
Specific Gravity, Urine: 1.015 (ref 1.005–1.030)
Urobilinogen, UA: 0.2 mg/dL (ref 0.0–1.0)

## 2014-01-18 LAB — CBC
HCT: 38.5 % (ref 36.0–46.0)
Hemoglobin: 13.3 g/dL (ref 12.0–15.0)
MCH: 29 pg (ref 26.0–34.0)
MCHC: 34.5 g/dL (ref 30.0–36.0)
MCV: 83.9 fL (ref 78.0–100.0)
Platelets: 321 10*3/uL (ref 150–400)
RBC: 4.59 MIL/uL (ref 3.87–5.11)
RDW: 12.8 % (ref 11.5–15.5)
WBC: 10.3 10*3/uL (ref 4.0–10.5)

## 2014-01-18 MED ORDER — CEPHALEXIN 500 MG PO CAPS: 500.0000 mg | ORAL_CAPSULE | Freq: Four times a day (QID) | ORAL | Status: AC

## 2014-01-18 MED ORDER — TETANUS-DIPHTH-ACELL PERTUSSIS 5-2.5-18.5 LF-MCG/0.5 IM SUSP: 0.5000 mL | Freq: Once | INTRAMUSCULAR | Status: DC

## 2014-01-18 NOTE — Progress Notes (Signed)
Patient reports some brown discharge yesterday and cramping. Went to MAU.  1hr due at 1215 Would like tdap

## 2014-01-18 NOTE — Patient Instructions (Signed)
Pregnancy and Urinary Tract Infection  A urinary tract infection (UTI) is a bacterial infection of the urinary tract. Infection of the urinary tract can include the ureters, kidneys (pyelonephritis), bladder (cystitis), and urethra (urethritis). All pregnant women should be screened for bacteria in the urinary tract. Identifying and treating a UTI will decrease the risk of preterm labor and developing more serious infections in both the mother and baby.  CAUSES  Bacteria germs cause almost all UTIs.   RISK FACTORS  Many factors can increase your chances of getting a UTI during pregnancy. These include:  · Having a short urethra.  · Poor toilet and hygiene habits.  · Sexual intercourse.  · Blockage of urine along the urinary tract.  · Problems with the pelvic muscles or nerves.  · Diabetes.  · Obesity.  · Bladder problems after having several children.  · Previous history of UTI.  SIGNS AND SYMPTOMS   · Pain, burning, or a stinging feeling when urinating.  · Suddenly feeling the need to urinate right away (urgency).  · Loss of bladder control (urinary incontinence).  · Frequent urination, more than is common with pregnancy.  · Lower abdominal or back discomfort.  · Cloudy urine.  · Blood in the urine (hematuria).  · Fever.   When the kidneys are infected, the symptoms may be:  · Back pain.  · Flank pain on the right side more so than the left.  · Fever.  · Chills.  · Nausea.  · Vomiting.  DIAGNOSIS   A urinary tract infection is usually diagnosed through urine tests. Additional tests and procedures are sometimes done. These may include:  · Ultrasound exam of the kidneys, ureters, bladder, and urethra.  · Looking in the bladder with a lighted tube (cystoscopy).  TREATMENT  Typically, UTIs can be treated with antibiotic medicines.   HOME CARE INSTRUCTIONS   · Only take over-the-counter or prescription medicines as directed by your health care provider. If you were prescribed antibiotics, take them as directed. Finish  them even if you start to feel better.  · Drink enough fluids to keep your urine clear or pale yellow.  · Do not have sexual intercourse until the infection is gone and your health care provider says it is okay.  · Make sure you are tested for UTIs throughout your pregnancy. These infections often come back.   Preventing a UTI in the Future  · Practice good toilet habits. Always wipe from front to back. Use the tissue only once.  · Do not hold your urine. Empty your bladder as soon as possible when the urge comes.  · Do not douche or use deodorant sprays.  · Wash with soap and warm water around the genital area and the anus.  · Empty your bladder before and after sexual intercourse.  · Wear underwear with a cotton crotch.  · Avoid caffeine and carbonated drinks. They can irritate the bladder.  · Drink cranberry juice or take cranberry pills. This may decrease the risk of getting a UTI.  · Do not drink alcohol.  · Keep all your appointments and tests as scheduled.   SEEK MEDICAL CARE IF:   · Your symptoms get worse.  · You are still having fevers 2 or more days after treatment begins.  · You have a rash.  · You feel that you are having problems with medicines prescribed.  · You have abnormal vaginal discharge.  SEEK IMMEDIATE MEDICAL CARE IF:   · You have back or flank   pain.  · You have chills.  · You have blood in your urine.  · You have nausea and vomiting.  · You have contractions of your uterus.  · You have a gush of fluid from the vagina.  MAKE SURE YOU:  · Understand these instructions.    · Will watch your condition.    · Will get help right away if you are not doing well or get worse.    Document Released: 11/22/2010 Document Revised: 05/18/2013 Document Reviewed: 02/24/2013  ExitCare® Patient Information ©2014 [employer].

## 2014-01-18 NOTE — Progress Notes (Signed)
Seen MAU 2 d ago for abd pain and spotting; dx UTI, prelim culture E Coli>100k. Rx Keflex. On 5/13 was given Macrobid for ASB.  28 labs labs. Encourage classes. tDap today.

## 2014-01-19 LAB — RPR

## 2014-01-19 LAB — HIV ANTIBODY (ROUTINE TESTING W REFLEX): HIV: NONREACTIVE

## 2014-01-19 LAB — GLUCOSE TOLERANCE, 1 HOUR (50G) W/O FASTING: GLUCOSE 1 HOUR GTT: 146 mg/dL — AB (ref 70–140)

## 2014-01-23 ENCOUNTER — Telehealth: Payer: Self-pay

## 2014-01-23 NOTE — Telephone Encounter (Signed)
Message copied by Louanna RawAMPBELL, Glori Machnik M on Mon Jan 23, 2014  8:21 AM ------      Message from: POE, DEIRDRE C      Created: Fri Jan 20, 2014  4:53 PM       Please sched 3 hr OGTT ------

## 2014-01-23 NOTE — Telephone Encounter (Signed)
Called patient and informed her of results. Patient states she can come in on Wednesday 01/25/14 at 0800. Informed patient she will be put on the schedule. Patient verbalized understanding. No questions or concerns.

## 2014-01-25 ENCOUNTER — Other Ambulatory Visit: Payer: Medicaid Other

## 2014-01-25 DIAGNOSIS — R7309 Other abnormal glucose: Secondary | ICD-10-CM

## 2014-01-26 LAB — GLUCOSE TOLERANCE, 3 HOURS
Glucose Tolerance, 1 hour: 193 mg/dL — ABNORMAL HIGH (ref 70–189)
Glucose Tolerance, 2 hour: 135 mg/dL (ref 70–164)
Glucose Tolerance, Fasting: 84 mg/dL (ref 70–104)
Glucose, GTT - 3 Hour: 69 mg/dL — ABNORMAL LOW (ref 70–144)

## 2014-01-27 LAB — URINE CULTURE: Colony Count: >=100000

## 2014-01-30 ENCOUNTER — Telehealth: Payer: Self-pay

## 2014-01-30 NOTE — Telephone Encounter (Signed)
DM education appointment scheduled for 02/02/14 at 1330 (has OB FU appointment at 1440). Attempted to call patient and inform of results and appointment. Marthe PatchHeard "the person you are trying to reach is not accepting calls at this time." Will attempt later. Will keep OB FU Thursday and after that appointment schedule to high risk clinic.

## 2014-01-30 NOTE — Telephone Encounter (Signed)
Message copied by Louanna RawAMPBELL, TAYLOR M on Mon Jan 30, 2014 11:16 AM ------      Message from: Adam PhenixARNOLD, JAMES G      Created: Mon Jan 30, 2014 10:41 AM       Elevated 1 hr need appt DM teaching, [facility] ------

## 2014-01-30 NOTE — Telephone Encounter (Signed)
Called patient and informed of results and appointment date, time and location at [facility] for diabetic education. [facility] session and after OB FU appointment her appointments will be changed to HR clinic days Monday or Thursday. Patient verbalized understanding. No questions or concerns.

## 2014-02-02 ENCOUNTER — Ambulatory Visit (HOSPITAL_COMMUNITY)
Admission: RE | Admit: 2014-02-02 | Discharge: 2014-02-02 | Disposition: A | Discharge status: Home / Self Care | Payer: Medicaid Other | Source: Ambulatory Visit | Attending: Obstetrics and Gynecology | Admitting: Obstetrics and Gynecology

## 2014-02-02 ENCOUNTER — Ambulatory Visit (INDEPENDENT_AMBULATORY_CARE_PROVIDER_SITE_OTHER): Payer: Medicaid Other | Admitting: Advanced Practice Midwife

## 2014-02-02 ENCOUNTER — Encounter: Payer: Medicaid Other | Attending: Obstetrics and Gynecology | Admitting: *Deleted

## 2014-02-02 VITALS — BP 113/67 | HR 87 | Wt 179.9 lb

## 2014-02-02 DIAGNOSIS — O99810 Abnormal glucose complicating pregnancy: Secondary | ICD-10-CM | POA: Insufficient documentation

## 2014-02-02 DIAGNOSIS — Z3480 Encounter for supervision of other normal pregnancy, unspecified trimester: Secondary | ICD-10-CM

## 2014-02-02 DIAGNOSIS — Z8632 Personal history of gestational diabetes: Secondary | ICD-10-CM | POA: Insufficient documentation

## 2014-02-02 DIAGNOSIS — Z713 Dietary counseling and surveillance: Secondary | ICD-10-CM | POA: Insufficient documentation

## 2014-02-02 DIAGNOSIS — Z3493 Encounter for supervision of normal pregnancy, unspecified, third trimester: Secondary | ICD-10-CM

## 2014-02-02 DIAGNOSIS — O24410 Gestational diabetes mellitus in pregnancy, diet controlled: Secondary | ICD-10-CM

## 2014-02-02 DIAGNOSIS — O24419 Gestational diabetes mellitus in pregnancy, unspecified control: Secondary | ICD-10-CM | POA: Insufficient documentation

## 2014-02-02 HISTORY — DX: Personal history of gestational diabetes: Z86.32

## 2014-02-02 LAB — POCT URINALYSIS DIP (DEVICE)
Bilirubin Urine: NEGATIVE
GLUCOSE, UA: 250 mg/dL — AB
HGB URINE DIPSTICK: NEGATIVE
Ketones, ur: NEGATIVE mg/dL
Nitrite: NEGATIVE
Protein, ur: 30 mg/dL — AB
SPECIFIC GRAVITY, URINE: 1.015 (ref 1.005–1.030)
Urobilinogen, UA: 0.2 mg/dL (ref 0.0–1.0)
pH: 7 (ref 5.0–8.0)

## 2014-02-02 NOTE — Progress Notes (Signed)
Doing well.  Good fetal movement, denies vaginal bleeding, LOF, regular contractions.  GDM diagnosed this week, pt went to diabetic education today, will bring glucose log and meter at next visit.

## 2014-02-02 NOTE — Addendum Note (Signed)
Encounter addended by: Rosendo GrosNancy L Halpin, RN on: 02/02/2014  4:43 PM<BR>     Documentation filed: Notes Section

## 2014-02-02 NOTE — Progress Notes (Signed)
[facility] [address] [phone] for test strips and lancets for Accu-Ck Nano. #100 with 12 refills. If difficulty with insurane may substitute for Accu-Ck Aviva.

## 2014-02-02 NOTE — Progress Notes (Signed)
  Patient was seen on 02/02/14 for Gestational Diabetes self-management . The following learning objectives were met by the patient :   States the definition of Gestational Diabetes  States why dietary management is important in controlling blood glucose  Describes the effects of carbohydrates on blood glucose levels  Demonstrates ability to create a balanced meal plan  Demonstrates carbohydrate counting   States when to check blood glucose levels  Demonstrates proper blood glucose monitoring techniques  States the effect of stress and exercise on blood glucose levels  States the importance of limiting caffeine and abstaining from alcohol and smoking  Plan:  Aim for 2 Carb Choices per meal (30 grams) +/- 1 either way for breakfast Aim for 3 Carb Choices per meal (45 grams) +/- 1 either way from lunch and dinner Aim for 1-2 Carbs per snack Begin reading food labels for Total Carbohydrate and sugar grams of foods Consider  increasing your activity level by walking daily as tolerated Begin checking BG before breakfast and 2 hours after first bit of breakfast, lunch and dinner after  as directed by MD  Take medication  as directed by MD  Blood glucose monitor given: Accu-Ck Nano Lot # [id number] Exp: 02/08/15 Blood glucose reading: 106  Patient instructed to monitor glucose levels: FBS: 60 - <90 1 hour: <140 2 hour: <120  Patient received the following handouts:  Nutrition Diabetes and Pregnancy  Carbohydrate Counting List  Meal Planning worksheet  Patient will be seen for follow-up as needed.  ORDERS: order for testing strips and lancets for testint 4X daily called to

## 2014-02-04 LAB — CULTURE, OB URINE
COLONY COUNT: NO GROWTH
ORGANISM ID, BACTERIA: NO GROWTH

## 2014-02-06 NOTE — MAU Provider Note (Signed)
I have seen and examined this patient and agree with above documentation in the resident's note.   Rulon AbideKeli Tyjay Galindo, M.D. [facility] 02/06/2014 8:05 AM

## 2014-02-17 ENCOUNTER — Encounter: Payer: Self-pay | Admitting: Obstetrics & Gynecology

## 2014-02-17 ENCOUNTER — Ambulatory Visit (INDEPENDENT_AMBULATORY_CARE_PROVIDER_SITE_OTHER): Payer: Medicaid Other | Admitting: Obstetrics & Gynecology

## 2014-02-17 VITALS — BP 109/70 | HR 84 | Wt 177.8 lb

## 2014-02-17 DIAGNOSIS — O99810 Abnormal glucose complicating pregnancy: Secondary | ICD-10-CM

## 2014-02-17 DIAGNOSIS — O24419 Gestational diabetes mellitus in pregnancy, unspecified control: Secondary | ICD-10-CM

## 2014-02-17 LAB — POCT URINALYSIS DIP (DEVICE)
BILIRUBIN URINE: NEGATIVE
Glucose, UA: 100 mg/dL — AB
KETONES UR: NEGATIVE mg/dL
Nitrite: POSITIVE — AB
Protein, ur: NEGATIVE mg/dL
Specific Gravity, Urine: 1.015 (ref 1.005–1.030)
Urobilinogen, UA: 0.2 mg/dL (ref 0.0–1.0)
pH: 6 (ref 5.0–8.0)

## 2014-02-17 NOTE — Progress Notes (Signed)
Routine visit. Good FM. Her fbs are all within range. I told her to quit checking those as her fingers are getting sore. Her other 2 hour post prandials are excellent so I told her to check after 2 meals per day. We will reevaluate this next week. She lost 2 pounds by sticking with the diabetic diet. She has no problems today.

## 2014-02-20 ENCOUNTER — Inpatient Hospital Stay (HOSPITAL_COMMUNITY)
Admission: AD | Admit: 2014-02-20 | Discharge: 2014-02-22 | DRG: 781 | Disposition: A | Discharge status: Home / Self Care | Payer: Medicaid Other | Source: Ambulatory Visit | Attending: Family Medicine | Admitting: Family Medicine

## 2014-02-20 ENCOUNTER — Encounter (HOSPITAL_COMMUNITY): Payer: Self-pay

## 2014-02-20 DIAGNOSIS — O99810 Abnormal glucose complicating pregnancy: Secondary | ICD-10-CM | POA: Diagnosis present

## 2014-02-20 DIAGNOSIS — O2303 Infections of kidney in pregnancy, third trimester: Secondary | ICD-10-CM

## 2014-02-20 DIAGNOSIS — O2390 Unspecified genitourinary tract infection in pregnancy, unspecified trimester: Principal | ICD-10-CM | POA: Diagnosis present

## 2014-02-20 DIAGNOSIS — O2300 Infections of kidney in pregnancy, unspecified trimester: Secondary | ICD-10-CM | POA: Diagnosis present

## 2014-02-20 DIAGNOSIS — Z87891 Personal history of nicotine dependence: Secondary | ICD-10-CM

## 2014-02-20 DIAGNOSIS — N390 Urinary tract infection, site not specified: Secondary | ICD-10-CM | POA: Diagnosis present

## 2014-02-20 DIAGNOSIS — N12 Tubulo-interstitial nephritis, not specified as acute or chronic: Secondary | ICD-10-CM | POA: Diagnosis present

## 2014-02-20 DIAGNOSIS — R51 Headache: Secondary | ICD-10-CM | POA: Diagnosis present

## 2014-02-20 DIAGNOSIS — Z833 Family history of diabetes mellitus: Secondary | ICD-10-CM

## 2014-02-20 HISTORY — DX: Gestational diabetes mellitus in pregnancy, unspecified control: O24.419

## 2014-02-20 HISTORY — DX: Infections of kidney in pregnancy, third trimester: O23.03

## 2014-02-20 LAB — CBC
HCT: 36.1 % (ref 36.0–46.0)
HEMOGLOBIN: 12.2 g/dL (ref 12.0–15.0)
MCH: 28.6 pg (ref 26.0–34.0)
MCHC: 33.8 g/dL (ref 30.0–36.0)
MCV: 84.5 fL (ref 78.0–100.0)
Platelets: 231 10*3/uL (ref 150–400)
RBC: 4.27 MIL/uL (ref 3.87–5.11)
RDW: 13 % (ref 11.5–15.5)
WBC: 11.3 10*3/uL — AB (ref 4.0–10.5)

## 2014-02-20 LAB — URINALYSIS, ROUTINE W REFLEX MICROSCOPIC
Bilirubin Urine: NEGATIVE
Ketones, ur: 15 mg/dL — AB
NITRITE: POSITIVE — AB
PH: 6 (ref 5.0–8.0)
Protein, ur: NEGATIVE mg/dL
SPECIFIC GRAVITY, URINE: 1.015 (ref 1.005–1.030)
Urobilinogen, UA: 0.2 mg/dL (ref 0.0–1.0)

## 2014-02-20 LAB — GLUCOSE, CAPILLARY
Glucose-Capillary: 118 mg/dL — ABNORMAL HIGH (ref 70–99)
Glucose-Capillary: 105 mg/dL — ABNORMAL HIGH (ref 70–99)
Glucose-Capillary: 98 mg/dL (ref 70–99)

## 2014-02-20 LAB — URINE MICROSCOPIC-ADD ON

## 2014-02-20 MED ORDER — ACETAMINOPHEN 325 MG PO TABS
650.0000 mg | ORAL_TABLET | ORAL | Status: DC | PRN
  Administered 2014-02-20 – 2014-02-21 (×2): 650 mg via ORAL
  Filled 2014-02-20 (×2): qty 2

## 2014-02-20 MED ORDER — DOCUSATE SODIUM 100 MG PO CAPS
100.0000 mg | ORAL_CAPSULE | Freq: Every day | ORAL | Status: DC
  Administered 2014-02-20 – 2014-02-21 (×2): 100 mg via ORAL
  Filled 2014-02-20 (×3): qty 1

## 2014-02-20 MED ORDER — CALCIUM CARBONATE ANTACID 500 MG PO CHEW
2.0000 | CHEWABLE_TABLET | ORAL | Status: DC | PRN
  Filled 2014-02-20: qty 2

## 2014-02-20 MED ORDER — ZOLPIDEM TARTRATE 5 MG PO TABS: 5.0000 mg | ORAL_TABLET | Freq: Every evening | ORAL | Status: DC | PRN

## 2014-02-20 MED ORDER — SODIUM CHLORIDE 0.9 % IV SOLN
INTRAVENOUS | Status: DC
  Administered 2014-02-20 – 2014-02-22 (×8): via INTRAVENOUS

## 2014-02-20 MED ORDER — LACTATED RINGERS IV BOLUS (SEPSIS)
500.0000 mL | Freq: Once | INTRAVENOUS | Status: AC
  Administered 2014-02-20: 500 mL via INTRAVENOUS

## 2014-02-20 MED ORDER — ACETAMINOPHEN 325 MG PO TABS
650.0000 mg | ORAL_TABLET | Freq: Once | ORAL | Status: AC
Start: 2014-02-20 — End: 2014-02-20
  Administered 2014-02-20: 650 mg via ORAL
  Filled 2014-02-20: qty 2

## 2014-02-20 MED ORDER — DEXTROSE 5 % IV SOLN
1.0000 g | Freq: Two times a day (BID) | INTRAVENOUS | Status: DC
  Administered 2014-02-20 – 2014-02-22 (×5): 1 g via INTRAVENOUS
  Filled 2014-02-20 (×5): qty 10

## 2014-02-20 MED ORDER — PRENATAL MULTIVITAMIN CH
1.0000 | ORAL_TABLET | Freq: Every day | ORAL | Status: DC
  Administered 2014-02-20 – 2014-02-21 (×2): 1 via ORAL
  Filled 2014-02-20 (×3): qty 1

## 2014-02-20 NOTE — Progress Notes (Signed)
Patient is requesting a breathing treatment.  Respiratory was contacted and they will be down to give treatment.

## 2014-02-20 NOTE — MAU Note (Signed)
Frontal headache since 10 pm tonight. Woke up this morning with chills; didn't take temp at home. Denies urinary complaints; treated for UTI a couple of weeks ago. States had trouble breathing at home she thinks b/c she was shaking so much with the chills. Denies difficulty breathing at this time. Denies contractions, vaginal bleeding, leaking of fluid. Positive fetal movement.

## 2014-02-20 NOTE — MAU Provider Note (Signed)
History     CSN: [id number]  Arrival date and time: 02/20/14 0425   None     Chief Complaint  Patient presents with  . Fever  . Chills  . Headache   HPI [name] is a 28 y.o G1P0 at 32w4 who presents for frontal head pain and chills. No assc vision changes, nausea or vomiting. Pt did not take temp at home, did take cbg which was 97 at that time. Additionally reports some dysuria. Denies hesitancy or frequency. Denies hematuria. Has had nml BMs. +Fm, neg vag bleeding neg contractions.   Of note was tx for UTI a few weeks ago. EColi, 01/16/14. Finished course of keflex.  [facility]  Genetic Screen  NT nml, AFP normal  Anatomic US Nl at 20 wk> f/u anat at 24 wks  Glucose Screen  Early 87/ 28 wk 146  GBS   Feeding Preference  Breast  Contraception  undecided  Circumcision  Female  Pap  Neg  FOB support Flu declined Tdap: 01/15/14 A1 gDM on diabetic diet currently.   OB History   Grav Para Term Preterm Abortions TAB SAB Ect Mult Living   1 0 0 0 0 0 0 0 0 0       Past Medical History  Diagnosis Date  . Small bowel obstruction     As infant with surgery; recent obstruction 2012  . Gestational diabetes 2015    diet controlled    Past Surgical History  Procedure Laterality Date  . Abdominal surgery      Surgery as infant    Family History  Problem Relation Age of Onset  . Diabetes Father     History  Substance Use Topics  . Smoking status: Former Games developermoker  . Smokeless tobacco: Never Used  . Alcohol Use: No    Allergies:  Allergies  Allergen Reactions  . Penicillins Hives    Facility-administered medications prior to admission  Medication Dose Route Frequency Provider Last Rate Last Dose  . Tdap (BOOSTRIX) injection 0.5 mL  0.5 mL Intramuscular Once Danae Orleanseirdre C Poe, CNM       Prescriptions prior to admission  Medication Sig Dispense Refill  . Prenatal Vit-Fe Fumarate-FA (PRENATAL MULTIVITAMIN) TABS tablet Take 1 tablet by mouth daily at 12 noon.         ROS As per HPI, otherwise negative Physical Exam   Blood pressure 102/58, pulse 128, temperature 100.3 F (37.9 C), temperature source Oral, resp. rate 20, height 5\' 2"  (1.575 m), weight 83.28 kg (183 lb 9.6 oz), last menstrual period 07/07/2013, SpO2 98.00%.  Physical Exam  Constitutional: She is oriented to person, place, and time. She appears well-developed and well-nourished.  HENT:  Head: Normocephalic and atraumatic.  Eyes: EOM are normal.  Neck: Neck supple.  Cardiovascular: Normal rate.   Respiratory: Effort normal.  GI: Soft. There is no tenderness. There is no rebound and no guarding.  Gravid; no CVA tenderness   Musculoskeletal: Normal range of motion. She exhibits no edema.  Neurological: She is alert and oriented to person, place, and time.  Skin: Skin is warm and dry.  Psychiatric: She has a normal mood and affect. Her behavior is normal.    MAU Course  Procedures  MDM CBG 118 Urinalysis    Component Value Date/Time   COLORURINE YELLOW 02/20/2014 0429   APPEARANCEUR CLEAR 02/20/2014 0429   LABSPEC 1.015 02/20/2014 0429   PHURINE 6.0 02/20/2014 0429   GLUCOSEU >1000* 02/20/2014 0429   HGBUR  TRACE* 02/20/2014 0429   BILIRUBINUR NEGATIVE 02/20/2014 0429   KETONESUR 15* 02/20/2014 0429   PROTEINUR NEGATIVE 02/20/2014 0429   UROBILINOGEN 0.2 02/20/2014 0429   NITRITE POSITIVE* 02/20/2014 0429   LEUKOCYTESUR MODERATE* 02/20/2014 0429   Urine Cx- pending CBC    Component Value Date/Time   WBC 11.3* 02/20/2014 0515   RBC 4.27 02/20/2014 0515   HGB 12.2 02/20/2014 0515   HCT 36.1 02/20/2014 0515   PLT 231 02/20/2014 0515   MCV 84.5 02/20/2014 0515   MCH 28.6 02/20/2014 0515   MCHC 33.8 02/20/2014 0515   RDW 13.0 02/20/2014 0515   LYMPHSABS 2.1 09/28/2013 1109   MONOABS 0.7 09/28/2013 1109   EOSABS 0.5 09/28/2013 1109   BASOSABS 0.0 09/28/2013 1109    S/p 500cc bolus LR Tylenol 650mg   Assessment and Plan  Ms Page is a 28 y.o G1P0 who presents for headache  and chills. Head pain likely related to fever/infectious etiology as well as some dehydration. Pt with ketones and >1000 glucose in urine. Technically does not meet criteria for fever with temp of 100.3 that improved with tylenol. And No systemic signs of infection. However given hx of gDM and headache will admit for complicated cystitis, as pt at risk for acute worsening.  -admit to Ante -CTX 1g q24, will alter abx regimen base on results of cx -vitals per floor protocol -urine culture pending -tylenol prn for fever/headache -cont to rehydrate with ivf -FHT cat i tracing, reassuring -blood sugars prn -carb modified diet  Case d/w Sid Falcon, CMN Anselm Lis 02/20/2014, 4:56 AM   I examined pt and agree with documentation above and resident plan of care. Eino Farber Paul Half, CNM

## 2014-02-21 DIAGNOSIS — O2390 Unspecified genitourinary tract infection in pregnancy, unspecified trimester: Principal | ICD-10-CM

## 2014-02-21 DIAGNOSIS — R51 Headache: Secondary | ICD-10-CM | POA: Diagnosis present

## 2014-02-21 DIAGNOSIS — O2300 Infections of kidney in pregnancy, unspecified trimester: Secondary | ICD-10-CM

## 2014-02-21 DIAGNOSIS — N12 Tubulo-interstitial nephritis, not specified as acute or chronic: Secondary | ICD-10-CM

## 2014-02-21 DIAGNOSIS — O99810 Abnormal glucose complicating pregnancy: Secondary | ICD-10-CM | POA: Diagnosis present

## 2014-02-21 DIAGNOSIS — N390 Urinary tract infection, site not specified: Secondary | ICD-10-CM

## 2014-02-21 DIAGNOSIS — Z87891 Personal history of nicotine dependence: Secondary | ICD-10-CM | POA: Diagnosis not present

## 2014-02-21 DIAGNOSIS — Z833 Family history of diabetes mellitus: Secondary | ICD-10-CM | POA: Diagnosis not present

## 2014-02-21 HISTORY — DX: Urinary tract infection, site not specified: N39.0

## 2014-02-21 HISTORY — DX: Infections of kidney in pregnancy, unspecified trimester: O23.00

## 2014-02-21 LAB — GLUCOSE, CAPILLARY
GLUCOSE-CAPILLARY: 77 mg/dL (ref 70–99)
GLUCOSE-CAPILLARY: 101 mg/dL — AB (ref 70–99)
Glucose-Capillary: 69 mg/dL — ABNORMAL LOW (ref 70–99)
Glucose-Capillary: 124 mg/dL — ABNORMAL HIGH (ref 70–99)

## 2014-02-21 NOTE — H&P (Signed)
Chief Complaint   Patient presents with   .  Fever   .  Chills   .  Headache    HPI  Suzanne Lane is a 28 y.o G1P0 at 32w4 who presents for frontal head pain and chills. No assc vision changes, nausea or vomiting. Pt did not take temp at home, did take cbg which was 97 at that time. Additionally reports some dysuria. Denies hesitancy or frequency. Denies hematuria. Has had nml BMs. +Fm, neg vag bleeding neg contractions.  Of note was tx for UTI a few weeks ago. EColi, 01/16/14. Finished course of keflex.  [facility]   Genetic Screen  NT nml, AFP normal   Anatomic [place]  Nl at 20 wk> f/u anat at 24 wks   Glucose Screen  Early 87/ 28 wk 146   GBS    Feeding Preference  Breast   Contraception  undecided   Circumcision  Female   Pap  Neg   FOB support  Flu declined  Tdap: 01/15/14  A1 gDM on diabetic diet currently.  OB History    Grav  Para  Term  Preterm  Abortions  TAB  SAB  Ect  Mult  Living    1  0  0  0  0  0  0  0  0  0      Past Medical History   Diagnosis  Date   .  Small bowel obstruction      As infant with surgery; recent obstruction 2012   .  Gestational diabetes  2015     diet controlled    Past Surgical History   Procedure  Laterality  Date   .  Abdominal surgery       Surgery as infant    Family History   Problem  Relation  Age of Onset   .  Diabetes  Father     History   Substance Use Topics   .  Smoking status:  Former Games developer   .  Smokeless tobacco:  Never Used   .  Alcohol Use:  No    Allergies:  Allergies   Allergen  Reactions   .  Penicillins  Hives    Facility-administered medications prior to admission   Medication  Dose  Route  Frequency  Provider  Last Rate  Last Dose   .  Tdap (BOOSTRIX) injection 0.5 mL  0.5 mL  Intramuscular  Once  Danae Orleans, CNM      Prescriptions prior to admission   Medication  Sig  Dispense  Refill   .  Prenatal Vit-Fe Fumarate-FA (PRENATAL MULTIVITAMIN) TABS tablet  Take 1 tablet by mouth daily at 12 noon.       ROS  As per HPI, otherwise negative  Physical Exam   Blood pressure 102/58, pulse 128, temperature 100.3 F (37.9 C), temperature source Oral, resp. rate 20, height 5\' 2"  (1.575 m), weight 83.28 kg (183 lb 9.6 oz), last menstrual period 07/07/2013, SpO2 98.00%.  Physical Exam  Constitutional: She is oriented to person, place, and time. She appears well-developed and well-nourished.  HENT:  Head: Normocephalic and atraumatic.  Eyes: EOM are normal.  Neck: Neck supple.  Cardiovascular: Normal rate.  Respiratory: Effort normal.  GI: Soft. There is no tenderness. There is no rebound and no guarding.  Gravid; no CVA tenderness  Musculoskeletal: Normal range of motion. She exhibits no edema.  Neurological: She is alert and oriented to person, place, and time.  Skin: Skin is warm and dry.  Psychiatric: She has a normal mood and affect. Her behavior is normal.   MAU Course   Procedures  MDM  CBG 118  Urinalysis    Component  Value  Date/Time    COLORURINE  YELLOW  02/20/2014 0429    APPEARANCEUR  CLEAR  02/20/2014 0429    LABSPEC  1.015  02/20/2014 0429    PHURINE  6.0  02/20/2014 0429    GLUCOSEU  >1000*  02/20/2014 0429    HGBUR  TRACE*  02/20/2014 0429    BILIRUBINUR  NEGATIVE  02/20/2014 0429    KETONESUR  15*  02/20/2014 0429    PROTEINUR  NEGATIVE  02/20/2014 0429    UROBILINOGEN  0.2  02/20/2014 0429    NITRITE  POSITIVE*  02/20/2014 0429    LEUKOCYTESUR  MODERATE*  02/20/2014 0429    Urine Cx- pending  CBC    Component  Value  Date/Time    WBC  11.3*  02/20/2014 0515    RBC  4.27  02/20/2014 0515    HGB  12.2  02/20/2014 0515    HCT  36.1  02/20/2014 0515    PLT  231  02/20/2014 0515    MCV  84.5  02/20/2014 0515    MCH  28.6  02/20/2014 0515    MCHC  33.8  02/20/2014 0515    RDW  13.0  02/20/2014 0515    LYMPHSABS  2.1  09/28/2013 1109    MONOABS  0.7  09/28/2013 1109    EOSABS  0.5  09/28/2013 1109    BASOSABS  0.0  09/28/2013 1109    S/p 500cc bolus LR  Tylenol 650mg    Assessment and Plan   Suzanne Lane is a 28 y.o G1P0 who presents for headache and chills. Head pain likely related to fever/infectious etiology as well as some dehydration. Pt with ketones and >1000 glucose in urine. Technically does not meet criteria for fever with temp of 100.3 that improved with tylenol. And No systemic signs of infection. However given hx of gDM and headache will admit for complicated cystitis, as pt at risk for acute worsening.  -admit to [facility]  -CTX 1g q24, will alter abx regimen base on results of cx  -vitals per floor protocol  -urine culture pending  -tylenol prn for fever/headache  -cont to rehydrate with ivf  -FHT cat i tracing, reassuring  -blood sugars prn  -carb modified diet  Case d/w Sid FalconWalidah Muhammad, CMN  Anselm LisMarsh, Melanie  02/20/2014, 4:56 AM  I examined pt and agree with documentation above and resident plan of care.  Eino FarberWalidah Paul HalfN Muhammad, CNM

## 2014-02-21 NOTE — Progress Notes (Signed)
FACULTY PRACTICE ANTEPARTUM(COMPREHENSIVE) NOTE  Suzanne RushingSerita Lane is a 28 y.o. G1P0000 at 3737w5d by early ultrasound who is admitted for complicated UTI.   Fetal presentation is unsure. Length of Stay:  1  Days  Subjective: Still with dysuria. Denies N/V. No more chills. Patient reports the fetal movement as decreased . Patient reports uterine contraction  activity as none. Patient reports  vaginal bleeding as none. Patient describes fluid per vagina as None.  Vitals:  Blood pressure 97/52, pulse 84, temperature 99.1 F (37.3 C), temperature source Oral, resp. rate 18, height 5\' 2"  (1.575 m), weight 183 lb 9.6 oz (83.28 kg), last menstrual period 07/07/2013, SpO2 96.00%. Physical Examination:  General appearance - alert, well appearing, and in no distress Abdomen - gravid, NT Fundal Height:  size equals dates Extremities: Homans sign is negative, no sign of DVT  Membranes:intact  Fetal Monitoring:  Baseline: 135 bpm, Variability: Good {> 6 bpm), Accelerations: Reactive and Decelerations: Absent  Labs:  Results for orders placed during the hospital encounter of 02/20/14 (from the past 24 hour(s))  GLUCOSE, CAPILLARY   Collection Time    02/20/14  2:26 PM      Result Value Ref Range   Glucose-Capillary 105 (*) 70 - 99 mg/dL  GLUCOSE, CAPILLARY   Collection Time    02/20/14  9:18 PM      Result Value Ref Range   Glucose-Capillary 98  70 - 99 mg/dL  GLUCOSE, CAPILLARY   Collection Time    02/21/14  6:18 AM      Result Value Ref Range   Glucose-Capillary 69 (*) 70 - 99 mg/dL    Medications:  Scheduled . cefTRIAXone (ROCEPHIN)  IV  1 g Intravenous Q12H  . docusate sodium  100 mg Oral Daily  . prenatal multivitamin  1 tablet Oral Q1200   I have reviewed the patient's current medications.  ASSESSMENT: Patient Active Problem List   Diagnosis Date Noted  . Complicated UTI (urinary tract infection) 02/21/2014  . Pyelonephritis affecting pregnancy in third trimester,  antepartum 02/20/2014  . Gestational diabetes 02/02/2014  . First trimester screening 10/19/2013  . Supervision of normal pregnancy 09/28/2013  . UTI (urinary tract infection) during pregnancy 09/28/2013  . Small bowel obstruction     PLAN: Await urine cultures. Possible discharge home after that, as pt. Remains afebrile.  [name],[name] S, MD 02/21/2014,6:44 AM

## 2014-02-21 NOTE — Progress Notes (Signed)
UR chart review completed.  

## 2014-02-22 LAB — CULTURE, OB URINE: Colony Count: >=100000

## 2014-02-22 LAB — GLUCOSE, CAPILLARY: Glucose-Capillary: 70 mg/dL (ref 70–99)

## 2014-02-22 MED ORDER — ACETAMINOPHEN 325 MG PO TABS: 650.0000 mg | ORAL_TABLET | Freq: Four times a day (QID) | ORAL | Status: DC | PRN

## 2014-02-22 MED ORDER — DSS 100 MG PO CAPS: 100.0000 mg | ORAL_CAPSULE | Freq: Two times a day (BID) | ORAL | Status: DC | PRN

## 2014-02-22 MED ORDER — CEPHALEXIN 500 MG PO CAPS: 500.0000 mg | ORAL_CAPSULE | Freq: Four times a day (QID) | ORAL | Status: DC

## 2014-02-22 NOTE — Discharge Instructions (Signed)
Pyelonephritis, Adult °Pyelonephritis is a kidney infection. In general, there are 2 main types of pyelonephritis: °· Infections that come on quickly without any warning (acute pyelonephritis). °· Infections that persist for a long period of time (chronic pyelonephritis). °CAUSES  °Two main causes of pyelonephritis are: °· Bacteria traveling from the bladder to the kidney. This is a problem especially in pregnant women. The urine in the bladder can become filled with bacteria from multiple causes, including: °¨ Inflammation of the prostate gland (prostatitis). °¨ Sexual intercourse in females. °¨ Bladder infection (cystitis). °· Bacteria traveling from the bloodstream to the tissue part of the kidney. °Problems that may increase your risk of getting a kidney infection include: °· Diabetes. °· Kidney stones or bladder stones. °· Cancer. °· Catheters placed in the bladder. °· Other abnormalities of the kidney or ureter. °SYMPTOMS  °· Abdominal pain. °· Pain in the side or flank area. °· Fever. °· Chills. °· Upset stomach. °· Blood in the urine (dark urine). °· Frequent urination. °· Strong or persistent urge to urinate. °· Burning or stinging when urinating. °DIAGNOSIS  °Your caregiver may diagnose your kidney infection based on your symptoms. A urine sample may also be taken. °TREATMENT  °In general, treatment depends on how severe the infection is.  °· If the infection is mild and caught early, your caregiver may treat you with oral antibiotics and send you home. °· If the infection is more severe, the bacteria may have gotten into the bloodstream. This will require intravenous (IV) antibiotics and a hospital stay. Symptoms may include: °¨ High fever. °¨ Severe flank pain. °¨ Shaking chills. °· Even after a hospital stay, your caregiver may require you to be on oral antibiotics for a period of time. °· Other treatments may be required depending upon the cause of the infection. °HOME CARE INSTRUCTIONS  °· Take your  antibiotics as directed. Finish them even if you start to feel better. °· Make an appointment to have your urine checked to make sure the infection is gone. °· Drink enough fluids to keep your urine clear or pale yellow. °· Take medicines for the bladder if you have urgency and frequency of urination as directed by your caregiver. °SEEK IMMEDIATE MEDICAL CARE IF:  °· You have a fever or persistent symptoms for more than 2-3 days. °· You have a fever and your symptoms suddenly get worse. °· You are unable to take your antibiotics or fluids. °· You develop shaking chills. °· You experience extreme weakness or fainting. °· There is no improvement after 2 days of treatment. °MAKE SURE YOU: °· Understand these instructions. °· Will watch your condition. °· Will get help right away if you are not doing well or get worse. °Document Released: 07/28/2005 Document Revised: 01/27/2012 Document Reviewed: 01/01/2011 °ExitCare® Patient Information ©2015 [employer]. This information is not intended to replace advice given to you by your health care provider. Make sure you discuss any questions you have with your health care provider. ° °

## 2014-02-22 NOTE — Discharge Summary (Addendum)
Physician Discharge Summary  Patient ID: Suzanne RushingSerita Lane MRN: [id number] DOB/AGE: 1986/04/30 27 y.o.  Admit date: 02/20/2014 Discharge date: 02/22/2014  Admission Diagnoses: pyelonephritis  Discharge Diagnoses: same Active Problems:   Pyelonephritis affecting pregnancy in third trimester, antepartum   Complicated UTI (urinary tract infection)   Pyelonephritis affecting pregnancy   Discharged Condition: good  Hospital Course: Patient admitted for treatment of complicated UTI/pyelonephritis in pregnancy. She received IV rocephin and remained afebrile for 48 hours prior to discharge. Fetal status remained reassuring throughout her admission. Her CBGs remained within range throughout her stay  Consults: None  Treatments: antibiotics: Ancef  Discharge Exam: Blood pressure 106/62, pulse 87, temperature 98.9 F (37.2 C), temperature source Oral, resp. rate 16, height 5\' 2"  (1.575 m), weight 183 lb 9.6 oz (83.28 kg), last menstrual period 07/07/2013, SpO2 96.00%. General appearance: alert, cooperative and no distress Resp: clear to auscultation bilaterally Cardio: regular rate and rhythm Extremities: Homans sign is negative, no sign of DVT and no edema, redness or tenderness in the calves or thighs Back: no CVA tenderness  FHT: baseline 130, mod variability, +accels, no decels Toco: no contractions  Disposition: 01-Home or Self Care     Medication List         acetaminophen 325 MG tablet  Commonly known as:  TYLENOL  Take 2 tablets (650 mg total) by mouth every 6 (six) hours as needed (pain).     cephALEXin 500 MG capsule  Commonly known as:  KEFLEX  Take 1 capsule (500 mg total) by mouth 4 (four) times daily.     DSS 100 MG Caps  Take 100 mg by mouth 2 (two) times daily as needed for mild constipation.     prenatal multivitamin Tabs tablet  Take 1 tablet by mouth daily at 12 noon.       Follow-up Information   Follow up with [facility]. (as scheduled on  7/20)    Specialty:  Obstetrics and Gynecology   Contact information:   [address] [place] [address] [phone]      Signed: Zaion Hreha 02/22/2014, 7:05 AM

## 2014-02-27 ENCOUNTER — Ambulatory Visit (INDEPENDENT_AMBULATORY_CARE_PROVIDER_SITE_OTHER): Payer: Medicaid Other | Admitting: Obstetrics & Gynecology

## 2014-02-27 VITALS — BP 106/67 | HR 80 | Temp 97.5°F | Wt 179.8 lb

## 2014-02-27 DIAGNOSIS — Z36 Encounter for antenatal screening of mother: Secondary | ICD-10-CM

## 2014-02-27 DIAGNOSIS — Z369 Encounter for antenatal screening, unspecified: Secondary | ICD-10-CM

## 2014-02-27 LAB — POCT URINALYSIS DIP (DEVICE)
Bilirubin Urine: NEGATIVE
Glucose, UA: 100 mg/dL — AB
Hgb urine dipstick: NEGATIVE
KETONES UR: NEGATIVE mg/dL
Nitrite: NEGATIVE
PH: 7 (ref 5.0–8.0)
PROTEIN: NEGATIVE mg/dL
SPECIFIC GRAVITY, URINE: 1.015 (ref 1.005–1.030)
Urobilinogen, UA: 0.2 mg/dL (ref 0.0–1.0)

## 2014-02-27 MED ORDER — CEPHALEXIN 500 MG PO CAPS: 500.0000 mg | ORAL_CAPSULE | Freq: Every day | ORAL | Status: DC

## 2014-02-27 NOTE — Progress Notes (Signed)
Edema- left foot

## 2014-02-27 NOTE — Progress Notes (Signed)
Pt recently hospitalized for pyelo.  Doing much better.  UA clear.  Continue full course of abx then one tablet daily for suppression the duration of pregnancy. Very sparse readings.  One heigh after breakfast and one high after dinner.  Pt encouraged to check more often.

## 2014-03-08 ENCOUNTER — Telehealth: Payer: Self-pay | Admitting: *Deleted

## 2014-03-08 MED ORDER — ACCU-CHEK FASTCLIX LANCETS MISC: Status: DC

## 2014-03-08 MED ORDER — GLUCOSE BLOOD VI STRP: ORAL_STRIP | Status: DC

## 2014-03-08 NOTE — Telephone Encounter (Signed)
Pt left message stating that she needs refill sent to her pharmacy for glucose test strips.  Rx sent as requested as well as for lancets not previously ordered. Message left for pt that her Rx has been sent as requested. She may call back if she has additional questions.

## 2014-03-13 ENCOUNTER — Other Ambulatory Visit: Payer: Self-pay | Admitting: Family Medicine

## 2014-03-13 ENCOUNTER — Ambulatory Visit (INDEPENDENT_AMBULATORY_CARE_PROVIDER_SITE_OTHER): Payer: Medicaid Other | Admitting: Family Medicine

## 2014-03-13 VITALS — BP 115/60 | HR 86 | Wt 179.6 lb

## 2014-03-13 DIAGNOSIS — O99810 Abnormal glucose complicating pregnancy: Secondary | ICD-10-CM

## 2014-03-13 LAB — POCT URINALYSIS DIP (DEVICE)
Bilirubin Urine: NEGATIVE
Glucose, UA: 250 mg/dL — AB
Hgb urine dipstick: NEGATIVE
KETONES UR: NEGATIVE mg/dL
Nitrite: NEGATIVE
PH: 6.5 (ref 5.0–8.0)
Protein, ur: NEGATIVE mg/dL
SPECIFIC GRAVITY, URINE: 1.02 (ref 1.005–1.030)
Urobilinogen, UA: 0.2 mg/dL (ref 0.0–1.0)

## 2014-03-13 LAB — OB RESULTS CONSOLE GC/CHLAMYDIA
CHLAMYDIA, DNA PROBE: NEGATIVE
GC PROBE AMP, GENITAL: NEGATIVE

## 2014-03-13 LAB — OB RESULTS CONSOLE GBS: STREP GROUP B AG: NEGATIVE

## 2014-03-13 NOTE — Progress Notes (Signed)
Patient is 28 y.o. G1P0000 4824w4d.  +FM, denies LOF, VB, contractions,, +vaginal discharge (baseline).  Overall feeling well. Hx of pyelo: on suppressive therapy ADM: fasting: 78-89, 2h PP: 88-112 ==> GBS and G/C today

## 2014-03-13 NOTE — Progress Notes (Signed)
Edema-left foot  Pressure-

## 2014-03-13 NOTE — Patient Instructions (Signed)
Levonorgestrel intrauterine device (IUD) What is this medicine? LEVONORGESTREL IUD (LEE voe nor jes trel) is a contraceptive (birth control) device. The device is placed inside the uterus by a healthcare professional. It is used to prevent pregnancy and can also be used to treat heavy bleeding that occurs during your period. Depending on the device, it can be used for 3 to 5 years. This medicine may be used for other purposes; ask your health care provider or pharmacist if you have questions. COMMON BRAND NAME(S): LILETTA, Mirena, Skyla What should I tell my health care provider before I take this medicine? They need to know if you have any of these conditions: -abnormal Pap smear -cancer of the breast, uterus, or cervix -diabetes -endometritis -genital or pelvic infection now or in the past -have more than one sexual partner or your partner has more than one partner -heart disease -history of an ectopic or tubal pregnancy -immune system problems -IUD in place -liver disease or tumor -problems with blood clots or take blood-thinners -use intravenous drugs -uterus of unusual shape -vaginal bleeding that has not been explained -an unusual or allergic reaction to levonorgestrel, other hormones, silicone, or polyethylene, medicines, foods, dyes, or preservatives -pregnant or trying to get pregnant -breast-feeding How should I use this medicine? This device is placed inside the uterus by a health care professional. Talk to your pediatrician regarding the use of this medicine in children. Special care may be needed. Overdosage: If you think you have taken too much of this medicine contact a poison control center or emergency room at once. NOTE: This medicine is only for you. Do not share this medicine with others. What if I miss a dose? This does not apply. What may interact with this medicine? Do not take this medicine with any of the following  medications: -amprenavir -bosentan -fosamprenavir This medicine may also interact with the following medications: -aprepitant -barbiturate medicines for inducing sleep or treating seizures -bexarotene -griseofulvin -medicines to treat seizures like carbamazepine, ethotoin, felbamate, oxcarbazepine, phenytoin, topiramate -modafinil -pioglitazone -rifabutin -rifampin -rifapentine -some medicines to treat HIV infection like atazanavir, indinavir, lopinavir, nelfinavir, tipranavir, ritonavir -St. John's wort -warfarin This list may not describe all possible interactions. Give your health care provider a list of all the medicines, herbs, non-prescription drugs, or dietary supplements you use. Also tell them if you smoke, drink alcohol, or use illegal drugs. Some items may interact with your medicine. What should I watch for while using this medicine? Visit your doctor or health care professional for regular check ups. See your doctor if you or your partner has sexual contact with others, becomes HIV positive, or gets a sexual transmitted disease. This product does not protect you against HIV infection (AIDS) or other sexually transmitted diseases. You can check the placement of the IUD yourself by reaching up to the top of your vagina with clean fingers to feel the threads. Do not pull on the threads. It is a good habit to check placement after each menstrual period. Call your doctor right away if you feel more of the IUD than just the threads or if you cannot feel the threads at all. The IUD may come out by itself. You may become pregnant if the device comes out. If you notice that the IUD has come out use a backup birth control method like condoms and call your health care provider. Using tampons will not change the position of the IUD and are okay to use during your period. What side effects may   I notice from receiving this medicine? Side effects that you should report to your doctor or  health care professional as soon as possible: -allergic reactions like skin rash, itching or hives, swelling of the face, lips, or tongue -fever, flu-like symptoms -genital sores -high blood pressure -no menstrual period for 6 weeks during use -pain, swelling, warmth in the leg -pelvic pain or tenderness -severe or sudden headache -signs of pregnancy -stomach cramping -sudden shortness of breath -trouble with balance, talking, or walking -unusual vaginal bleeding, discharge -yellowing of the eyes or skin Side effects that usually do not require medical attention (report to your doctor or health care professional if they continue or are bothersome): -acne -breast pain -change in sex drive or performance -changes in weight -cramping, dizziness, or faintness while the device is being inserted -headache -irregular menstrual bleeding within first 3 to 6 months of use -nausea This list may not describe all possible side effects. Call your doctor for medical advice about side effects. You may report side effects to FDA at 1-800-FDA-1088. Where should I keep my medicine? This does not apply. NOTE: This sheet is a summary. It may not cover all possible information. If you have questions about this medicine, talk to your doctor, pharmacist, or health care provider.  2015, Elsevier/Gold Standard. (2011-08-28 13:54:04)    Etonogestrel implant What is this medicine? ETONOGESTREL (et oh noe JES trel) is a contraceptive (birth control) device. It is used to prevent pregnancy. It can be used for up to 3 years. This medicine may be used for other purposes; ask your health care provider or pharmacist if you have questions. COMMON BRAND NAME(S): Implanon, Nexplanon What should I tell my health care provider before I take this medicine? They need to know if you have any of these conditions: -abnormal vaginal bleeding -blood vessel disease or blood clots -cancer of the breast, cervix, or  liver -depression -diabetes -gallbladder disease -headaches -heart disease or recent heart attack -high blood pressure -high cholesterol -kidney disease -liver disease -renal disease -seizures -tobacco smoker -an unusual or allergic reaction to etonogestrel, other hormones, anesthetics or antiseptics, medicines, foods, dyes, or preservatives -pregnant or trying to get pregnant -breast-feeding How should I use this medicine? This device is inserted just under the skin on the inner side of your upper arm by a health care professional. Talk to your pediatrician regarding the use of this medicine in children. Special care may be needed. Overdosage: If you think you've taken too much of this medicine contact a poison control center or emergency room at once. Overdosage: If you think you have taken too much of this medicine contact a poison control center or emergency room at once. NOTE: This medicine is only for you. Do not share this medicine with others. What if I miss a dose? This does not apply. What may interact with this medicine? Do not take this medicine with any of the following medications: -amprenavir -bosentan -fosamprenavir This medicine may also interact with the following medications: -barbiturate medicines for inducing sleep or treating seizures -certain medicines for fungal infections like ketoconazole and itraconazole -griseofulvin -medicines to treat seizures like carbamazepine, felbamate, oxcarbazepine, phenytoin, topiramate -modafinil -phenylbutazone -rifampin -some medicines to treat HIV infection like atazanavir, indinavir, lopinavir, nelfinavir, tipranavir, ritonavir -St. John's wort This list may not describe all possible interactions. Give your health care provider a list of all the medicines, herbs, non-prescription drugs, or dietary supplements you use. Also tell them if you smoke, drink alcohol, or use illegal drugs.  interact with your  medicine. What should I watch for while using this medicine? This product does not protect you against HIV infection (AIDS) or other sexually transmitted diseases. You should be able to feel the implant by pressing your fingertips over the skin where it was inserted. Tell your doctor if you cannot feel the implant. What side effects may I notice from receiving this medicine? Side effects that you should report to your doctor or health care professional as soon as possible: -allergic reactions like skin rash, itching or hives, swelling of the face, lips, or tongue -breast lumps -changes in vision -confusion, trouble speaking or understanding -dark urine -depressed mood -general ill feeling or flu-like symptoms -light-colored stools -loss of appetite, nausea -right upper belly pain -severe headaches -severe pain, swelling, or tenderness in the abdomen -shortness of breath, chest pain, swelling in a leg -signs of pregnancy -sudden numbness or weakness of the face, arm or leg -trouble walking, dizziness, loss of balance or coordination -unusual vaginal bleeding, discharge -unusually weak or tired -yellowing of the eyes or skin Side effects that usually do not require medical attention (Report these to your doctor or health care professional if they continue or are bothersome.): -acne -breast pain -changes in weight -cough -fever or chills -headache -irregular menstrual bleeding -itching, burning, and vaginal discharge -pain or difficulty passing urine -sore throat This list may not describe all possible side effects. Call your doctor for medical advice about side effects. You may report side effects to FDA at 1-800-FDA-1088. Where should I keep my medicine? This drug is given in a hospital or clinic and will not be stored at home. NOTE: This sheet is a summary. It may not cover all possible information. If you have questions about this medicine, talk to your doctor, pharmacist, or  health care provider.  2015, Elsevier/Gold Standard. (2012-02-02 15:37:45)  

## 2014-03-14 LAB — GC/CHLAMYDIA PROBE AMP
CT PROBE, AMP APTIMA: NEGATIVE
GC PROBE AMP APTIMA: NEGATIVE

## 2014-03-15 LAB — CULTURE, BETA STREP (GROUP B ONLY)

## 2014-03-20 ENCOUNTER — Ambulatory Visit (INDEPENDENT_AMBULATORY_CARE_PROVIDER_SITE_OTHER): Payer: Medicaid Other | Admitting: Obstetrics and Gynecology

## 2014-03-20 VITALS — BP 119/63 | HR 79 | Temp 97.2°F | Wt 181.0 lb

## 2014-03-20 DIAGNOSIS — O99810 Abnormal glucose complicating pregnancy: Secondary | ICD-10-CM

## 2014-03-20 DIAGNOSIS — O24410 Gestational diabetes mellitus in pregnancy, diet controlled: Secondary | ICD-10-CM

## 2014-03-20 LAB — POCT URINALYSIS DIP (DEVICE)
Bilirubin Urine: NEGATIVE
Glucose, UA: 100 mg/dL — AB
Hgb urine dipstick: NEGATIVE
Ketones, ur: NEGATIVE mg/dL
NITRITE: NEGATIVE
PH: 5.5 (ref 5.0–8.0)
Protein, ur: NEGATIVE mg/dL
Specific Gravity, Urine: 1.01 (ref 1.005–1.030)
UROBILINOGEN UA: 0.2 mg/dL (ref 0.0–1.0)

## 2014-03-20 NOTE — Progress Notes (Signed)
Patient is 28 y.o. G1P0000 2526w4d. +FM, denies LOF, VB, contractions,, +vaginal discharge (baseline). Overall feeling well.  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 Ordered growth scan as this never got done.  Feet are very swollen over past week. [facility] and WNL. BP WNL today. ==> GBS and G/C neg

## 2014-03-21 ENCOUNTER — Ambulatory Visit (HOSPITAL_COMMUNITY)
Admission: RE | Admit: 2014-03-21 | Discharge: 2014-03-21 | Disposition: A | Discharge status: Home / Self Care | Payer: Medicaid Other | Source: Ambulatory Visit | Attending: Obstetrics and Gynecology | Admitting: Obstetrics and Gynecology

## 2014-03-21 DIAGNOSIS — O24419 Gestational diabetes mellitus in pregnancy, unspecified control: Secondary | ICD-10-CM

## 2014-03-21 DIAGNOSIS — Z3689 Encounter for other specified antenatal screening: Secondary | ICD-10-CM | POA: Diagnosis not present

## 2014-03-21 DIAGNOSIS — O99810 Abnormal glucose complicating pregnancy: Secondary | ICD-10-CM | POA: Insufficient documentation

## 2014-03-21 DIAGNOSIS — O24410 Gestational diabetes mellitus in pregnancy, diet controlled: Secondary | ICD-10-CM

## 2014-03-27 ENCOUNTER — Encounter: Payer: Self-pay | Admitting: Obstetrics and Gynecology

## 2014-03-28 ENCOUNTER — Inpatient Hospital Stay (HOSPITAL_COMMUNITY)
Admission: AD | Admit: 2014-03-28 | Discharge: 2014-04-01 | DRG: 775 | Disposition: A | Discharge status: Home / Self Care | Payer: Medicaid Other | Source: Ambulatory Visit | Attending: Family Medicine | Admitting: Family Medicine

## 2014-03-28 ENCOUNTER — Encounter (HOSPITAL_COMMUNITY): Payer: Self-pay | Admitting: *Deleted

## 2014-03-28 DIAGNOSIS — O4290 Premature rupture of membranes, unspecified as to length of time between rupture and onset of labor, unspecified weeks of gestation: Secondary | ICD-10-CM | POA: Diagnosis present

## 2014-03-28 DIAGNOSIS — Z833 Family history of diabetes mellitus: Secondary | ICD-10-CM

## 2014-03-28 DIAGNOSIS — O99814 Abnormal glucose complicating childbirth: Secondary | ICD-10-CM | POA: Diagnosis present

## 2014-03-28 DIAGNOSIS — O2390 Unspecified genitourinary tract infection in pregnancy, unspecified trimester: Secondary | ICD-10-CM | POA: Diagnosis not present

## 2014-03-28 DIAGNOSIS — Z6832 Body mass index (BMI) 32.0-32.9, adult: Secondary | ICD-10-CM

## 2014-03-28 DIAGNOSIS — O479 False labor, unspecified: Secondary | ICD-10-CM | POA: Diagnosis present

## 2014-03-28 DIAGNOSIS — Z87891 Personal history of nicotine dependence: Secondary | ICD-10-CM | POA: Diagnosis not present

## 2014-03-28 DIAGNOSIS — E669 Obesity, unspecified: Secondary | ICD-10-CM | POA: Diagnosis present

## 2014-03-28 DIAGNOSIS — O99214 Obesity complicating childbirth: Secondary | ICD-10-CM

## 2014-03-28 LAB — CBC
HEMATOCRIT: 41.4 % (ref 36.0–46.0)
Hemoglobin: 13.8 g/dL (ref 12.0–15.0)
MCH: 27.9 pg (ref 26.0–34.0)
MCHC: 33.3 g/dL (ref 30.0–36.0)
MCV: 83.8 fL (ref 78.0–100.0)
Platelets: 245 10*3/uL (ref 150–400)
RBC: 4.94 MIL/uL (ref 3.87–5.11)
RDW: 14 % (ref 11.5–15.5)
WBC: 11.7 10*3/uL — AB (ref 4.0–10.5)

## 2014-03-28 LAB — GLUCOSE, CAPILLARY: Glucose-Capillary: 64 mg/dL — ABNORMAL LOW (ref 70–99)

## 2014-03-28 MED ORDER — LACTATED RINGERS IV SOLN
INTRAVENOUS | Status: DC
  Administered 2014-03-28 – 2014-03-30 (×4): via INTRAVENOUS

## 2014-03-28 MED ORDER — FENTANYL CITRATE 0.05 MG/ML IJ SOLN
100.0000 ug | Freq: Once | INTRAMUSCULAR | Status: AC
  Administered 2014-03-28: 100 ug via INTRAVENOUS
  Filled 2014-03-28: qty 2

## 2014-03-28 MED ORDER — OXYTOCIN 40 UNITS IN LACTATED RINGERS INFUSION - SIMPLE MED
62.5000 mL/h | INTRAVENOUS | Status: DC
  Administered 2014-03-30: 62.5 mL/h via INTRAVENOUS
  Filled 2014-03-28 (×2): qty 1000

## 2014-03-28 MED ORDER — ONDANSETRON HCL 4 MG/2ML IJ SOLN: 4.0000 mg | Freq: Four times a day (QID) | INTRAMUSCULAR | Status: DC | PRN

## 2014-03-28 MED ORDER — LACTATED RINGERS IV SOLN: 500.0000 mL | INTRAVENOUS | Status: DC | PRN

## 2014-03-28 MED ORDER — LACTATED RINGERS IV SOLN
500.0000 mL | Freq: Once | INTRAVENOUS | Status: AC
  Administered 2014-03-29: 500 mL via INTRAVENOUS

## 2014-03-28 MED ORDER — TERBUTALINE SULFATE 1 MG/ML IJ SOLN: 0.2500 mg | Freq: Once | INTRAMUSCULAR | Status: AC | PRN

## 2014-03-28 MED ORDER — FENTANYL 2.5 MCG/ML BUPIVACAINE 1/10 % EPIDURAL INFUSION (WH - ANES)
14.0000 mL/h | INTRAMUSCULAR | Status: DC | PRN
  Administered 2014-03-29 – 2014-03-30 (×3): 14 mL/h via EPIDURAL
  Filled 2014-03-28 (×3): qty 125

## 2014-03-28 MED ORDER — PHENYLEPHRINE 40 MCG/ML (10ML) SYRINGE FOR IV PUSH (FOR BLOOD PRESSURE SUPPORT)
80.0000 ug | PREFILLED_SYRINGE | INTRAVENOUS | Status: DC | PRN
  Filled 2014-03-28: qty 2

## 2014-03-28 MED ORDER — LIDOCAINE HCL (PF) 1 % IJ SOLN
30.0000 mL | INTRAMUSCULAR | Status: DC | PRN
  Filled 2014-03-28: qty 30

## 2014-03-28 MED ORDER — EPHEDRINE 5 MG/ML INJ
10.0000 mg | INTRAVENOUS | Status: DC | PRN
  Filled 2014-03-28: qty 4
  Filled 2014-03-28: qty 2

## 2014-03-28 MED ORDER — ACETAMINOPHEN 325 MG PO TABS
650.0000 mg | ORAL_TABLET | ORAL | Status: DC | PRN
  Administered 2014-03-30: 650 mg via ORAL
  Filled 2014-03-28: qty 2

## 2014-03-28 MED ORDER — MISOPROSTOL 25 MCG QUARTER TABLET: 25.0000 ug | ORAL_TABLET | ORAL | Status: DC | PRN

## 2014-03-28 MED ORDER — CITRIC ACID-SODIUM CITRATE 334-500 MG/5ML PO SOLN: 30.0000 mL | ORAL | Status: DC | PRN

## 2014-03-28 MED ORDER — PHENYLEPHRINE 40 MCG/ML (10ML) SYRINGE FOR IV PUSH (FOR BLOOD PRESSURE SUPPORT)
80.0000 ug | PREFILLED_SYRINGE | INTRAVENOUS | Status: DC | PRN
  Filled 2014-03-28: qty 2
  Filled 2014-03-28: qty 10

## 2014-03-28 MED ORDER — OXYTOCIN BOLUS FROM INFUSION
500.0000 mL | INTRAVENOUS | Status: DC
  Administered 2014-03-30: 500 mL via INTRAVENOUS

## 2014-03-28 MED ORDER — FENTANYL CITRATE 0.05 MG/ML IJ SOLN
100.0000 ug | INTRAMUSCULAR | Status: DC | PRN
  Administered 2014-03-29 (×5): 100 ug via INTRAVENOUS
  Filled 2014-03-28 (×5): qty 2

## 2014-03-28 MED ORDER — EPHEDRINE 5 MG/ML INJ
10.0000 mg | INTRAVENOUS | Status: DC | PRN
  Filled 2014-03-28: qty 2

## 2014-03-28 MED ORDER — OXYCODONE-ACETAMINOPHEN 5-325 MG PO TABS: 1.0000 | ORAL_TABLET | ORAL | Status: DC | PRN

## 2014-03-28 MED ORDER — MISOPROSTOL 50MCG HALF TABLET
50.0000 ug | ORAL_TABLET | ORAL | Status: DC | PRN
  Administered 2014-03-28 – 2014-03-29 (×3): 50 ug via ORAL
  Filled 2014-03-28: qty 1
  Filled 2014-03-28: qty 0.5
  Filled 2014-03-28: qty 1

## 2014-03-28 MED ORDER — IBUPROFEN 600 MG PO TABS
600.0000 mg | ORAL_TABLET | Freq: Four times a day (QID) | ORAL | Status: DC | PRN
  Administered 2014-04-01: 600 mg via ORAL
  Filled 2014-03-28: qty 1

## 2014-03-28 MED ORDER — DIPHENHYDRAMINE HCL 50 MG/ML IJ SOLN: 12.5000 mg | INTRAMUSCULAR | Status: DC | PRN

## 2014-03-28 NOTE — MAU Note (Signed)
Patient states she has had leaking of clear fluid with some pink in it that started about 1645. States some irregular contractions. Reports good fetal movement.

## 2014-03-28 NOTE — H&P (Signed)
Attestation of Attending Supervision of Obstetric Fellow: Evaluation and management procedures were performed by the Obstetric Fellow under my supervision and collaboration.  I have reviewed the Obstetric Fellow's note and chart, and I agree with the management and plan.  Zhana Jeangilles, MD, FACOG Attending Obstetrician & Gynecologist [facility]   

## 2014-03-28 NOTE — H&P (Signed)
Suzanne SchaumannSerita Zonia Lane is a 28 y.o. female G1P0000 with IUP at 3923w5d presenting for ROM. Pt states she has been having regular, every 5 minutes contractions, associated with Initially a small amount of pink fluid at 4:45pm, then a large gush of clear fluid in the MAU at 5:15.  No frank vaginal bleeding.  active fetal movement.   [facility]  Prenatal History/Complications:  Past Medical History: Past Medical History  Diagnosis Date  . Small bowel obstruction     As infant with surgery; recent obstruction 2012  . Gestational diabetes 2015    diet controlled    Past Surgical History: Past Surgical History  Procedure Laterality Date  . Abdominal surgery      Surgery as infant    Obstetrical History: OB History   Grav Para Term Preterm Abortions TAB SAB Ect Mult Living   1 0 0 0 0 0 0 0 0 0       Gynecological History: OB History   Grav Para Term Preterm Abortions TAB SAB Ect Mult Living   1 0 0 0 0 0 0 0 0 0       Social History: History   Social History  . Marital Status: Single    Spouse Name: N/A    Number of Children: N/A  . Years of Education: N/A   Social History Main Topics  . Smoking status: Former Smoker -- 0.50 packs/day for 10 years    Types: Cigarettes    Quit date: 08/11/2013  . Smokeless tobacco: Never Used  . Alcohol Use: No  . Drug Use: No  . Sexual Activity: Yes   Other Topics Concern  . None   Social History Narrative  . None    Family History: Family History  Problem Relation Age of Onset  . Diabetes Father   . Alcohol abuse Father   . Alcohol abuse Mother   . Miscarriages / [place] Mother   . Cancer Maternal Aunt   . Depression Maternal Aunt   . Cancer Maternal Grandfather   . Cancer Paternal Grandfather   . Early death Paternal Grandfather     Allergies: Allergies  Allergen Reactions  . Penicillins Hives    Can take cephalosporins- no reaction    Prescriptions prior to admission  Medication Sig  Dispense Refill  . cephALEXin (KEFLEX) 500 MG capsule Take 1 capsule (500 mg total) by mouth daily.  30 capsule  2  . Prenatal Vit-Fe Fumarate-FA (PRENATAL MULTIVITAMIN) TABS tablet Take 1 tablet by mouth daily at 12 noon.      Marland Kitchen. ACCU-CHEK FASTCLIX LANCETS MISC Use as instructed to check blood sugar 4 times daily  100 each  12     Review of Systems   Constitutional: No fever, chills, or fatigue   Blood pressure 114/74, pulse 94, temperature 98.8 F (37.1 C), temperature source Oral, resp. rate 16, height 5\' 2"  (1.575 m), weight 81.557 kg (179 lb 12.8 oz), last menstrual period 07/07/2013, SpO2 97.00%. General appearance: alert, cooperative and no distress Lungs: clear to auscultation bilaterally Heart: regular rate and rhythm Abdomen: soft, non-tender; bowel sounds normal Pelvic: Pooling in the vaginal vault. No blood noted Extremities: Homans sign is negative, no sign of DVT DTR's Normal Presentation: cephalic Fetal monitoringBaseline: 130s bpm, Variability: Good {> 6 bpm) and Accelerations: Reactive Uterine activityFrequency: Every 2-3 minutes Dilation: 1 Effacement (%): 50 Exam by:: Dr. Loreta AveAcosta   Prenatal labs: ABO, Rh: O/POS/-- (02/18 1109) Antibody: NEG (02/18 1109) Rubella:   RPR:  NON REAC (06/10 1357)  HBsAg: NEGATIVE (02/18 1109)  HIV: NONREACTIVE (06/10 1357)  GBS: Negative (08/03 0000)  1 hr Glucola 146 3 hr GTT: 84, 193, 135, 69 Genetic screening  Quad screen neg Anatomy US: Growth is normal. Left lateral placenta without previa   Prenatal Transfer Tool  Maternal Diabetes: A1DM, diet controlled  Genetic Screening: Normal Maternal Ultrasounds/Referrals: Normal Fetal Ultrasounds or other Referrals:  None Maternal Substance Abuse:  No Significant Maternal Medications:  None Significant Maternal Lab Results: None GBS neg     No results found for this or any previous visit (from the past 24 hour(s)).  Assessment: Suzanne Lane is a 28 y.o. G1P0000 at  [redacted]w[redacted]d by LMP here for ROM and term labor #Labor: Expected SVD #Pain: Epidural  #FWB: Normal #ID:  No abx needed  #MOF: Breast #MOC:Undecided #Circ:  Girl  Suzanne Lane 03/28/2014, 6:48 PM  OB fellow attestation:  I have seen and examined this patient; I agree with above documentation in the resident's note.   Suzanne Lane is a 28 y.o. G1P0000 reporting loss of fluid +FM, denies VB, vaginal discharge.  PE: BP 116/74  Pulse 78  Temp(Src) 98 F (36.7 C) (Oral)  Resp 20  Ht 5\' 2"  (1.575 m)  Wt 179 lb (81.194 kg)  BMI 32.73 kg/m2  SpO2 97%  LMP 07/07/2013 Gen: calm comfortable, NAD Resp: normal effort, no distress Abd: gravid - pooling, +fern  ROS, labs, PMH reviewed  Plan: - admit to L&D for PROM, induction with PO cytotec  Suzanne Mount, MD 9:50 PM

## 2014-03-29 ENCOUNTER — Inpatient Hospital Stay (HOSPITAL_COMMUNITY): Payer: Medicaid Other | Admitting: Anesthesiology

## 2014-03-29 ENCOUNTER — Encounter (HOSPITAL_COMMUNITY): Payer: Medicaid Other | Admitting: Anesthesiology

## 2014-03-29 ENCOUNTER — Encounter (HOSPITAL_COMMUNITY): Payer: Self-pay | Admitting: Anesthesiology

## 2014-03-29 LAB — GLUCOSE, CAPILLARY
GLUCOSE-CAPILLARY: 77 mg/dL (ref 70–99)
Glucose-Capillary: 131 mg/dL — ABNORMAL HIGH (ref 70–99)
Glucose-Capillary: 75 mg/dL (ref 70–99)
Glucose-Capillary: 81 mg/dL (ref 70–99)
Glucose-Capillary: 94 mg/dL (ref 70–99)
Glucose-Capillary: 97 mg/dL (ref 70–99)

## 2014-03-29 LAB — RPR

## 2014-03-29 MED ORDER — OXYTOCIN 40 UNITS IN LACTATED RINGERS INFUSION - SIMPLE MED
1.0000 m[IU]/min | INTRAVENOUS | Status: DC
  Administered 2014-03-29: 2 m[IU]/min via INTRAVENOUS

## 2014-03-29 MED ORDER — TERBUTALINE SULFATE 1 MG/ML IJ SOLN: 0.2500 mg | Freq: Once | INTRAMUSCULAR | Status: AC | PRN

## 2014-03-29 MED ORDER — FENTANYL 2.5 MCG/ML BUPIVACAINE 1/10 % EPIDURAL INFUSION (WH - ANES)
INTRAMUSCULAR | Status: DC | PRN
  Administered 2014-03-29: 14 mL/h via EPIDURAL

## 2014-03-29 MED ORDER — OXYTOCIN 40 UNITS IN LACTATED RINGERS INFUSION - SIMPLE MED: 1.0000 m[IU]/min | INTRAVENOUS | Status: DC

## 2014-03-29 MED ORDER — TERBUTALINE SULFATE 1 MG/ML IJ SOLN: 0.2500 mg | Freq: Once | INTRAMUSCULAR | Status: DC | PRN

## 2014-03-29 MED ORDER — MISOPROSTOL 25 MCG QUARTER TABLET
25.0000 ug | ORAL_TABLET | Freq: Once | ORAL | Status: DC
  Filled 2014-03-29: qty 0.25

## 2014-03-29 MED ORDER — LIDOCAINE HCL (PF) 1 % IJ SOLN
INTRAMUSCULAR | Status: DC | PRN
  Administered 2014-03-29 (×2): 4 mL

## 2014-03-29 NOTE — Anesthesia Preprocedure Evaluation (Signed)
Anesthesia Evaluation  Patient identified by MRN, date of birth, ID band Patient awake    Reviewed: Allergy & Precautions, H&P , Patient's Chart, lab work & pertinent test results  Airway Mallampati: III TM Distance: >3 FB Neck ROM: Full    Dental no notable dental hx. (+) Teeth Intact   Pulmonary former smoker,  breath sounds clear to auscultation  Pulmonary exam normal       Cardiovascular negative cardio ROS  Rhythm:Regular Rate:Normal     Neuro/Psych negative neurological ROS  negative psych ROS   GI/Hepatic negative GI ROS, Neg liver ROS,   Endo/Other  diabetes, GestationalObesity  Renal/GU Renal diseasePyelonephritis  negative genitourinary   Musculoskeletal negative musculoskeletal ROS (+)   Abdominal (+) + obese,   Peds  Hematology negative hematology ROS (+)   Anesthesia Other Findings   Reproductive/Obstetrics (+) Pregnancy                           Anesthesia Physical Anesthesia Plan  ASA: II  Anesthesia Plan: Epidural   Post-op Pain Management:    Induction:   Airway Management Planned: Natural Airway  Additional Equipment:   Intra-op Plan:   Post-operative Plan:   Informed Consent: I have reviewed the patients History and Physical, chart, labs and discussed the procedure including the risks, benefits and alternatives for the proposed anesthesia with the patient or authorized representative who has indicated his/her understanding and acceptance.     Plan Discussed with: Anesthesiologist  Anesthesia Plan Comments:         Anesthesia Quick Evaluation

## 2014-03-29 NOTE — Progress Notes (Signed)
   Suzanne SchaumannSerita Zonia KiefStephens is a 28 y.o. G1P0000 at 2943w6d  admitted for rupture of membranes  Subjective: Contractions are getting somewhat stronger, feeling them in her back.   Objective: Filed Vitals:   03/29/14 1230 03/29/14 1300 03/29/14 1330 03/29/14 1400  BP:      Pulse:      Temp:      TempSrc:      Resp: 18 20 18 18   Height:      Weight:      SpO2:          FHT:  120s, moderate variability, + accels, no decels UC:   regular, every 3-4 minutes SVE:   Dilation: 2.5 Effacement (%): 60 Station: Ballotable Exam by:: Dr. Leonides Schanzorsey Suzanne Lane(M. Causey, RN )   Labs: Lab Results  Component Value Date   WBC 11.7* 03/28/2014   HGB 13.8 03/28/2014   HCT 41.4 03/28/2014   MCV 83.8 03/28/2014   PLT 245 03/28/2014    Assessment / Plan: Augmentation of labor, progressing well  Labor: Will change from Cytotec PO to cytoteck vaginally Fetal Wellbeing:  Category I Pain Control:  Fentanyl Anticipated MOD:  NSVD  Suzanne Lane, Suzanne Lane S 03/29/2014, 2:29 PM

## 2014-03-29 NOTE — Anesthesia Procedure Notes (Signed)
Epidural Patient location during procedure: OB Start time: 03/29/2014 5:31 PM  Staffing Anesthesiologist: Michiko Lineman A. Performed by: anesthesiologist   Preanesthetic Checklist Completed: patient identified, site marked, surgical consent, pre-op evaluation, timeout performed, IV checked, risks and benefits discussed and monitors and equipment checked  Epidural Patient position: sitting Prep: site prepped and draped and DuraPrep Patient monitoring: continuous pulse ox and blood pressure Approach: midline Location: L3-L4 Injection technique: LOR air  Needle:  Needle type: Tuohy  Needle gauge: 17 G Needle length: 9 cm and 9 Needle insertion depth: 5 cm cm Catheter type: closed end flexible Catheter size: 19 Gauge Catheter at skin depth: 10 cm Test dose: negative and Other  Assessment Events: blood not aspirated, injection not painful, no injection resistance, negative IV test and no paresthesia  Additional Notes Patient identified. Risks and benefits discussed including failed block, incomplete  Pain control, post dural puncture headache, nerve damage, paralysis, blood pressure Changes, nausea, vomiting, reactions to medications-both toxic and allergic and post Partum back pain. All questions were answered. Patient expressed understanding and wished to proceed. Sterile technique was used throughout procedure. Epidural site was Dressed with sterile barrier dressing. No paresthesias, signs of intravascular injection Or signs of intrathecal spread were encountered.  Patient was more comfortable after the epidural was dosed. Please see RN's note for documentation of vital signs and FHR which are stable.

## 2014-03-29 NOTE — Progress Notes (Signed)
Suzanne SchaumannSerita Lane Lane is a 28 y.o. G1P0000 at 6762w6d by LMP admitted for PROM  Subjective: Doing well; No complaints.   Objective: BP 113/74  Pulse 67  Temp(Src) 97.8 F (36.6 C) (Oral)  Resp 20  Ht 5\' 2"  (1.575 m)  Wt 81.194 kg (179 lb)  BMI 32.73 kg/m2  SpO2 97%  LMP 07/07/2013      FHT:  FHR: 135 bpm, variability: moderate,  accelerations:  Abscent,  decelerations:  Absent UC:   regular, every 3-5 minutes SVE:   Dilation: 2.5 Effacement (%): 50 Station: -3 Exam by:: dr Suzanne Lane  Labs: Lab Results  Component Value Date   WBC 11.7* 03/28/2014   HGB 13.8 03/28/2014   HCT 41.4 03/28/2014   MCV 83.8 03/28/2014   PLT 245 03/28/2014    Assessment / Plan: Induction of labor due to PROM,  progressing well with cytotec  Labor: Progressing normally and Attempted FB but unsuccessful; PO cytotec 2nd dose given Preeclampsia:  no signs or symptoms of toxicity Fetal Wellbeing:  Category I Pain Control:  Fentanyl I/D:  GBS neg Anticipated MOD:  NSVD  Suzanne Lane, Suzanne Lane 03/29/2014, 2:51 AM

## 2014-03-30 ENCOUNTER — Encounter (HOSPITAL_COMMUNITY): Payer: Self-pay | Admitting: *Deleted

## 2014-03-30 DIAGNOSIS — O2390 Unspecified genitourinary tract infection in pregnancy, unspecified trimester: Secondary | ICD-10-CM

## 2014-03-30 DIAGNOSIS — O99814 Abnormal glucose complicating childbirth: Secondary | ICD-10-CM

## 2014-03-30 LAB — TYPE AND SCREEN
ABO/RH(D): O POS
Antibody Screen: NEGATIVE

## 2014-03-30 LAB — GLUCOSE, CAPILLARY
GLUCOSE-CAPILLARY: 104 mg/dL — AB (ref 70–99)
Glucose-Capillary: 101 mg/dL — ABNORMAL HIGH (ref 70–99)
Glucose-Capillary: 45 mg/dL — ABNORMAL LOW (ref 70–99)
Glucose-Capillary: 59 mg/dL — ABNORMAL LOW (ref 70–99)
Glucose-Capillary: 63 mg/dL — ABNORMAL LOW (ref 70–99)
Glucose-Capillary: 88 mg/dL (ref 70–99)

## 2014-03-30 LAB — ABO/RH: ABO/RH(D): O POS

## 2014-03-30 MED ORDER — SENNOSIDES-DOCUSATE SODIUM 8.6-50 MG PO TABS
2.0000 | ORAL_TABLET | ORAL | Status: DC
  Administered 2014-03-30 – 2014-04-01 (×2): 2 via ORAL
  Filled 2014-03-30 (×2): qty 2

## 2014-03-30 MED ORDER — ZOLPIDEM TARTRATE 5 MG PO TABS: 5.0000 mg | ORAL_TABLET | Freq: Every evening | ORAL | Status: DC | PRN

## 2014-03-30 MED ORDER — ONDANSETRON HCL 4 MG PO TABS: 4.0000 mg | ORAL_TABLET | ORAL | Status: DC | PRN

## 2014-03-30 MED ORDER — IBUPROFEN 600 MG PO TABS
600.0000 mg | ORAL_TABLET | Freq: Four times a day (QID) | ORAL | Status: DC
  Administered 2014-03-30 – 2014-04-01 (×7): 600 mg via ORAL
  Filled 2014-03-30 (×7): qty 1

## 2014-03-30 MED ORDER — DIBUCAINE 1 % RE OINT
1.0000 "application " | TOPICAL_OINTMENT | RECTAL | Status: DC | PRN
  Filled 2014-03-30: qty 28

## 2014-03-30 MED ORDER — PRENATAL MULTIVITAMIN CH
1.0000 | ORAL_TABLET | Freq: Every day | ORAL | Status: DC
  Administered 2014-03-31 – 2014-04-01 (×2): 1 via ORAL
  Filled 2014-03-30 (×2): qty 1

## 2014-03-30 MED ORDER — LANOLIN HYDROUS EX OINT: TOPICAL_OINTMENT | CUTANEOUS | Status: DC | PRN

## 2014-03-30 MED ORDER — SIMETHICONE 80 MG PO CHEW: 80.0000 mg | CHEWABLE_TABLET | ORAL | Status: DC | PRN

## 2014-03-30 MED ORDER — WITCH HAZEL-GLYCERIN EX PADS: 1.0000 "application " | MEDICATED_PAD | CUTANEOUS | Status: DC | PRN

## 2014-03-30 MED ORDER — BENZOCAINE-MENTHOL 20-0.5 % EX AERO
1.0000 "application " | INHALATION_SPRAY | CUTANEOUS | Status: DC | PRN
  Filled 2014-03-30: qty 56

## 2014-03-30 MED ORDER — DIPHENHYDRAMINE HCL 25 MG PO CAPS: 25.0000 mg | ORAL_CAPSULE | Freq: Four times a day (QID) | ORAL | Status: DC | PRN

## 2014-03-30 MED ORDER — ONDANSETRON HCL 4 MG/2ML IJ SOLN: 4.0000 mg | INTRAMUSCULAR | Status: DC | PRN

## 2014-03-30 MED ORDER — OXYCODONE-ACETAMINOPHEN 5-325 MG PO TABS: 1.0000 | ORAL_TABLET | ORAL | Status: DC | PRN

## 2014-03-30 MED ORDER — TETANUS-DIPHTH-ACELL PERTUSSIS 5-2.5-18.5 LF-MCG/0.5 IM SUSP
0.5000 mL | Freq: Once | INTRAMUSCULAR | Status: DC
  Filled 2014-03-30: qty 0.5

## 2014-03-30 NOTE — Progress Notes (Signed)
   Suzanne SchaumannSerita Zonia KiefStephens is a 28 y.o. G1P0000 at [redacted]w[redacted]d  admitted for rupture of membranes  Subjective: Feeling some pressure but no pain. No urges to push currently.   Objective: Filed Vitals:   03/30/14 1001 03/30/14 1031 03/30/14 1101 03/30/14 1131  BP: 98/56 99/63 99/46  104/62  Pulse: 95 96 98 95  Temp:   98.7 F (37.1 C)   TempSrc:   Oral   Resp: 18 20 18 20   Height:      Weight:      SpO2:          FHT:  FHR: 140 bpm, variability: moderate,  accelerations:  Present,  decelerations:  Absent UC:   regular, every 2-3 minutes SVE:   Dilation: Lip/rim Effacement (%): 100 Station: +2 (caput felt) Exam by:: Enis SlipperJane Bailey, RN Pitocin @ 24 mu/min  Labs: Lab Results  Component Value Date   WBC 11.7* 03/28/2014   HGB 13.8 03/28/2014   HCT 41.4 03/28/2014   MCV 83.8 03/28/2014   PLT 245 03/28/2014    Assessment / Plan: Augmentation of labor, progressing well  Labor: Progressing normally, Will start pushing in approx. 30 minutes Fetal Wellbeing:  Category I Pain Control:  Epidural Anticipated MOD:  NSVD  [name], Crystal S 03/30/2014, 11:50 AM

## 2014-03-30 NOTE — Lactation Note (Signed)
This note was copied from the chart of Girl Jamelle RushingSerita Kozel. Lactation Consultation Note  Patient Name: Girl Jamelle RushingSerita Geddis ZOXWR'UToday's Date: 03/30/2014 Reason for consult: Initial assessment of this mother/baby dyad at 5 hours postpartum. Mom is primipara and [facility].  Her nurse reports that nipples are inverted and has assisted her with latch attempts, hand expression and has provided shells and hand pump for use to help nipples evert.  At time of LC visit, baby asleep and room filled w/visitors.  LC encouraged mom to call for latch assistance when baby showing hunger cues.  LC encouraged STS and encouraged review of Baby and Me pp 9, 14 and 20-25 for STS and BF information. LC provided [facility] Resource brochure and reviewed [facility] services and list of community and web site resources.      Maternal Data Formula Feeding for Exclusion: No Has patient been taught Hand Expression?: Yes (as documented and reported by RN, Marcelino DusterMichelle at [phone] feeding attempt) Does the patient have breastfeeding experience prior to this delivery?: No  Feeding Feeding Type: Breast Fed Length of feed: 1 min (no active sucking)  LATCH Score/Interventions Latch: Repeated attempts needed to sustain latch, nipple held in mouth throughout feeding, stimulation needed to elicit sucking reflex. Intervention(s): Assist with latch;Adjust position  Audible Swallowing: None Intervention(s): Skin to skin;Hand expression Intervention(s): Skin to skin  Type of Nipple: Inverted Intervention(s): Reverse pressure  Comfort (Breast/Nipple): Soft / non-tender     Hold (Positioning): Assistance needed to correctly position infant at breast and maintain latch.  LATCH Score: 4  Lactation Tools Discussed/Used   STS, cue feedings, use of shells and hand pump between feedings and prior to latch attempts  Consult Status Consult Status: Follow-up Date: 03/31/14 Follow-up type: In-patient    Warrick ParisianBryant, Stephnie Parlier  [facility] 03/30/2014, 8:00 PM

## 2014-03-30 NOTE — Progress Notes (Signed)
Recheck cbg 59. 4oz juice given. Will recheck in 15 minutes. CNM Clelia CroftShaw notified

## 2014-03-30 NOTE — Progress Notes (Signed)
4 oz juice given per hypoglycemia protocol. Will recheck cbg in 15 minutes

## 2014-03-31 NOTE — Progress Notes (Signed)
Ur chart review completed.  

## 2014-03-31 NOTE — Progress Notes (Addendum)
Post Partum Day 1 Subjective: no complaints, up ad lib, voiding, tolerating PO and + flatus Still feels weak, would like to stay another night.  Objective: Blood pressure 119/84, pulse 88, temperature 97.8 F (36.6 C), temperature source Oral, resp. rate 18, height 5\' 2"  (1.575 m), weight 81.194 kg (179 lb), last menstrual period 07/07/2013, SpO2 98.00%, unknown if currently breastfeeding.  Physical Exam:  General: alert, cooperative and no distress Lochia: appropriate Uterine Fundus: firm DVT Evaluation: No evidence of DVT seen on physical exam. Negative Homan's sign. No cords or calf tenderness.   Recent Labs  03/28/14 1900  HGB 13.8  HCT 41.4    Assessment/Plan: Plan for discharge tomorrow, Breastfeeding and Contraception still undecided   LOS: 3 days   [name], Crystal S 03/31/2014, 9:03 AM   Attestation of Attending Supervision of Resident: Evaluation and management procedures were performed by the [facility] under my supervision.  I have seen and examined the patient, reviewed the resident's note and chart, and I agree with the management and plan.  Jaynie CollinsUGONNA  Zeno Hickel, MD, FACOG Attending Obstetrician & Gynecologist Faculty Practice, [facility] [place]

## 2014-03-31 NOTE — Anesthesia Postprocedure Evaluation (Signed)
  Anesthesia Post-op Note  Patient: Suzanne RushingSerita Lane  Procedure(s) Performed: * No procedures listed *  Patient Location: Mother/Baby  Anesthesia Type:Epidural  Level of Consciousness: awake, alert  and oriented  Airway and Oxygen Therapy: Patient Spontanous Breathing  Post-op Pain: none  Post-op Assessment: Post-op Vital signs reviewed, Patient's Cardiovascular Status Stable, Respiratory Function Stable, No headache, No backache, No residual numbness and No residual motor weakness  Post-op Vital Signs: Reviewed and stable  Last Vitals:  Filed Vitals:   03/31/14 0546  BP: 119/84  Pulse: 88  Temp: 36.6 C  Resp: 18    Complications: No apparent anesthesia complications

## 2014-03-31 NOTE — Lactation Note (Signed)
This note was copied from the chart of Suzanne Lane Cahue. Lactation Consultation Note    Follow up consult with this mom and baby, now 212 hours old, 5 stools, and 2 voids. Mom has an inverted right nipple, but a large, everted left nipple. She was using the nipple shield on her left, but the shilel was too large for the baby, and not applied well. I removed shield, and was able to get baby latched in what appeared deep. After about 10 minutes of sucking, baby again fussy. I hand expressed 5 mls of colostrum, and spoon fed this to baby, and she fell asleep. On exam, I noted an upper lip frenulum that extends to her gum lin, and a posterier , short, white frenulum , causing a bowl shaped tongue, with limited movements. Mom shown my findings, and is aware I am informing her pediatricain, Dr. Pricilla Holmucker, from [place] [facility] - I called her and left a message. I also informed faculty Dr Ave Filterhandler of above, and that a feeding plan was established with mom.  . I set up a DEP for mom, and she is pumping in premie setting, every 3 hours, 15 minutes.  Mom will still breast feed, and supplement her baby with EBM, by bottle.  Patient Name: Suzanne Lane Spiller ZOXWR'UToday's Date: 03/31/2014 Reason for consult: Follow-up assessment   Maternal Data    Feeding Feeding Type: Breast Milk Length of feed: 10 min  LATCH Score/Interventions Latch: Repeated attempts needed to sustain latch, nipple held in mouth throughout feeding, stimulation needed to elicit sucking reflex. (baby very fussy, on and off nipple) Intervention(s): Adjust position;Assist with latch;Breast compression  Audible Swallowing: A few with stimulation  Type of Nipple: Everted at rest and after stimulation (right nipple inverted, left everted) Intervention(s): Shells;Hand pump;Double electric pump  Comfort (Breast/Nipple): Filling, red/small blisters or bruises, mild/mod discomfort  Problem noted: Mild/Moderate discomfort (no redness or breakdown, bu  tender) Interventions (Mild/moderate discomfort): Hand expression  Hold (Positioning): Assistance needed to correctly position infant at breast and maintain latch. Intervention(s): Breastfeeding basics reviewed;Support Pillows;Position options;Skin to skin  LATCH Score: 6  Lactation Tools Discussed/Used WIC Program: Yes (information faxed to WIC-+) Pump Review: Setup, frequency, and cleaning;Milk Storage;Other (comment) (premie setting, hand expression) Initiated by:: clee rn lc Date initiated:: 03/31/14   Consult Status Consult Status: Follow-up Date: 04/01/14 Follow-up type: In-patient    Alfred LevinsLee, Vannesa Abair Anne 03/31/2014, 12:33 PM

## 2014-04-01 MED ORDER — MEASLES, MUMPS & RUBELLA VAC ~~LOC~~ INJ
0.5000 mL | INJECTION | Freq: Once | SUBCUTANEOUS | Status: AC
  Administered 2014-04-01: 0.5 mL via SUBCUTANEOUS
  Filled 2014-04-01: qty 0.5

## 2014-04-01 MED ORDER — IBUPROFEN 600 MG PO TABS: 600.0000 mg | ORAL_TABLET | Freq: Four times a day (QID) | ORAL | Status: DC

## 2014-04-01 NOTE — Discharge Summary (Signed)
Obstetric Discharge Summary Reason for Admission: onset of labor Prenatal Procedures: none Intrapartum Procedures: spontaneous vaginal delivery Postpartum Procedures: none Complications-Operative and Postpartum: none Hemoglobin  Date Value Ref Range Status  03/28/2014 13.8  12.0 - 15.0 g/dL Final     HCT  Date Value Ref Range Status  03/28/2014 41.4  36.0 - 46.0 % Final    Discharge Diagnoses: Term Pregnancy-delivered  Hospital Course:  Suzanne Lane is a 28 y.o. G1P1001 who presented with ROM.  She had a uncomplicated SVD. She was able to ambulate, tolerate PO and void normally. She was discharged home with instructions for postpartum care.    Delivery Note At 2:01 PM a viable female was delivered via (Presentation: LOA ; ). APGAR: 3, 8; weight 6lb 10.9oz.  Placenta status: itnact, 1 loose nuchal . Cord: 3 vessels with no complications.   Anesthesia: Epidural  Episiotomy: None  Lacerations: Small abrasions  Suture Repair: NA  Est. Blood Loss (mL): 250cc  Mom to postpartum. Baby to Couplet care / Skin to Skin then to warmer.    Physical Exam:  General: alert, cooperative, appears stated age and no distress Lochia: appropriate Uterine Fundus: firm DVT Evaluation: No evidence of DVT seen on physical exam. Negative Homan's sign. No cords or calf tenderness.  Discharge Information: Date: 04/01/2014 Activity: pelvic rest Diet: routine Medications: PNV and Ibuprofen Baby feeding: plans to breastfeed Contraception: Still considering, may do Nexplanon Condition: stable Instructions: refer to practice specific booklet Discharge to: home Will need 2hr GTT at pp visit  Newborn Data: Live born female  Birth Weight: 6 lb 10.9 oz (3031 g) APGAR: 3, 8  Home with mother.  Rodrigo Ran, MD [facility] PGY-1 04/01/2014, 8:53 AM  I have seen and examined this patient and I agree with the above. Cam Hai CNM 3:17 AM 04/08/2014

## 2014-04-01 NOTE — Discharge Instructions (Signed)

## 2014-04-03 ENCOUNTER — Encounter: Payer: Medicaid Other | Admitting: Obstetrics & Gynecology

## 2014-04-04 ENCOUNTER — Encounter: Payer: Self-pay | Admitting: Family Medicine

## 2014-05-12 ENCOUNTER — Ambulatory Visit (INDEPENDENT_AMBULATORY_CARE_PROVIDER_SITE_OTHER): Payer: Medicaid Other | Admitting: Family Medicine

## 2014-05-12 ENCOUNTER — Encounter: Payer: Self-pay | Admitting: Family Medicine

## 2014-05-12 DIAGNOSIS — Z30430 Encounter for insertion of intrauterine contraceptive device: Secondary | ICD-10-CM

## 2014-05-12 DIAGNOSIS — Z308 Encounter for other contraceptive management: Secondary | ICD-10-CM

## 2014-05-12 LAB — POCT PREGNANCY, URINE: Preg Test, Ur: NEGATIVE

## 2014-05-12 MED ORDER — ETONOGESTREL 68 MG ~~LOC~~ IMPL
68.0000 mg | DRUG_IMPLANT | Freq: Once | SUBCUTANEOUS | Status: AC
  Administered 2014-05-12: 68 mg via SUBCUTANEOUS

## 2014-05-12 MED ORDER — ETONOGESTREL 68 MG ~~LOC~~ IMPL: 1.0000 | DRUG_IMPLANT | Freq: Once | SUBCUTANEOUS | Status: DC

## 2014-05-12 NOTE — Addendum Note (Signed)
Addended byRaynald Blend: Tennie Grussing L on: 05/12/2014 11:15 AM   Modules accepted: Orders

## 2014-05-12 NOTE — Patient Instructions (Addendum)
Etonogestrel implant What is this medicine? ETONOGESTREL (et oh noe JES trel) is a contraceptive (birth control) device. It is used to prevent pregnancy. It can be used for up to 3 years. This medicine may be used for other purposes; ask your health care provider or pharmacist if you have questions. COMMON BRAND NAME(S): Implanon, Nexplanon What should I tell my health care provider before I take this medicine? They need to know if you have any of these conditions: -abnormal vaginal bleeding -blood vessel disease or blood clots -cancer of the breast, cervix, or liver -depression -diabetes -gallbladder disease -headaches -heart disease or recent heart attack -high blood pressure -high cholesterol -kidney disease -liver disease -renal disease -seizures -tobacco smoker -an unusual or allergic reaction to etonogestrel, other hormones, anesthetics or antiseptics, medicines, foods, dyes, or preservatives -pregnant or trying to get pregnant -breast-feeding How should I use this medicine? This device is inserted just under the skin on the inner side of your upper arm by a health care professional. Talk to your pediatrician regarding the use of this medicine in children. Special care may be needed. Overdosage: If you think you've taken too much of this medicine contact a poison control center or emergency room at once. Overdosage: If you think you have taken too much of this medicine contact a poison control center or emergency room at once. NOTE: This medicine is only for you. Do not share this medicine with others. What if I miss a dose? This does not apply. What may interact with this medicine? Do not take this medicine with any of the following medications: -amprenavir -bosentan -fosamprenavir This medicine may also interact with the following medications: -barbiturate medicines for inducing sleep or treating seizures -certain medicines for fungal infections like ketoconazole and  itraconazole -griseofulvin -medicines to treat seizures like carbamazepine, felbamate, oxcarbazepine, phenytoin, topiramate -modafinil -phenylbutazone -rifampin -some medicines to treat HIV infection like atazanavir, indinavir, lopinavir, nelfinavir, tipranavir, ritonavir -St. John's wort This list may not describe all possible interactions. Give your health care provider a list of all the medicines, herbs, non-prescription drugs, or dietary supplements you use. Also tell them if you smoke, drink alcohol, or use illegal drugs. Some items may interact with your medicine. What should I watch for while using this medicine? This product does not protect you against HIV infection (AIDS) or other sexually transmitted diseases. You should be able to feel the implant by pressing your fingertips over the skin where it was inserted. Tell your doctor if you cannot feel the implant. What side effects may I notice from receiving this medicine? Side effects that you should report to your doctor or health care professional as soon as possible: -allergic reactions like skin rash, itching or hives, swelling of the face, lips, or tongue -breast lumps -changes in vision -confusion, trouble speaking or understanding -dark urine -depressed mood -general ill feeling or flu-like symptoms -light-colored stools -loss of appetite, nausea -right upper belly pain -severe headaches -severe pain, swelling, or tenderness in the abdomen -shortness of breath, chest pain, swelling in a leg -signs of pregnancy -sudden numbness or weakness of the face, arm or leg -trouble walking, dizziness, loss of balance or coordination -unusual vaginal bleeding, discharge -unusually weak or tired -yellowing of the eyes or skin Side effects that usually do not require medical attention (Report these to your doctor or health care professional if they continue or are bothersome.): -acne -breast pain -changes in  weight -cough -fever or chills -headache -irregular menstrual bleeding -itching, burning, and   vaginal discharge -pain or difficulty passing urine -sore throat This list may not describe all possible side effects. Call your doctor for medical advice about side effects. You may report side effects to FDA at 1-800-FDA-1088. Where should I keep my medicine? This drug is given in a hospital or clinic and will not be stored at home. NOTE: This sheet is a summary. It may not cover all possible information. If you have questions about this medicine, talk to your doctor, pharmacist, or health care provider.  2015, Elsevier/Gold Standard. (2012-02-02 15:37:45)  

## 2014-05-12 NOTE — Addendum Note (Signed)
Addended by: Sherre LainASH, AMANDA A on: 05/12/2014 10:45 AM   Modules accepted: Orders

## 2014-05-12 NOTE — Progress Notes (Signed)
  Subjective:     Suzanne Lane is a 28 y.o. female who presents for a postpartum visit. She is 6 weeks postpartum following a spontaneous vaginal delivery. I have fully reviewed the prenatal and intrapartum course. The delivery was at 38 gestational weeks. Outcome: spontaneous vaginal delivery. Anesthesia: epidural. Postpartum course has been normal. Baby's course has been normal. Baby is feeding by bottle. Bleeding no bleeding. Bowel function is normal. Bladder function is normal. Patient is not sexually active. Contraception method is IUD. Postpartum depression screening: negative.  The following portions of the patient's history were reviewed and updated as appropriate: allergies, current medications, past family history, past medical history, past social history, past surgical history and problem list.  Review of Systems Pertinent items are noted in HPI.   Objective:    BP 100/73  Pulse 65  Ht 5\' 1"  (1.549 m)  Wt 172 lb (78.019 kg)  BMI 32.52 kg/m2  LMP 05/04/2014  Breastfeeding? No  General:  alert, cooperative and no distress  Lungs: clear to auscultation bilaterally  Heart:  regular rate and rhythm, S1, S2 normal, no murmur, click, rub or gallop  Abdomen: soft, non-tender; bowel sounds normal; no masses,  no organomegaly   Vulva:  normal  Vagina: normal vagina, no discharge, exudate, lesion, or erythema  Cervix:  no cervical motion tenderness and no lesions  Corpus: normal size, contour, position, consistency, mobility, non-tender  Adnexa:  normal adnexa  Rectal Exam: Not performed.        Assessment:     6 postpartum exam. Pap smear not done at today's visit.   Plan:    1. Contraception: IUD 2. Follow up in: 4 weeks for string check or as needed.    IUD Procedure Note Patient identified, informed consent performed, signed copy in chart, time out was performed.  Urine pregnancy test negative.  Speculum placed in the vagina.  Cervix visualized.  Cleaned with  Betadine x 2.  Grasped anteriorly with a single tooth tenaculum.  Uterus sounded to 7 cm.  Mirena IUD placed per manufacturer's recommendations, however mirena fell out upon removal of the applicator.  Patient decided to switch to nexplanon.  Nexplanon Insertion Patient given informed consent, signed copy in the chart, time out was performed. Pregnancy test was negative. Appropriate time out taken.  Patient's left arm was prepped and draped in the usual sterile fashion.. The ruler used to measure and mark insertion area.  Pt was prepped with alcohol swab and then injected with 3 cc of 1% lidocaine with epinephrine.  Pt was prepped with betadine, Implanon removed form packaging,  Device confirmed in needle, then inserted full length of needle and withdrawn per handbook instructions.  Pt insertion site covered with gauze and coban.   Minimal blood loss.  Pt tolerated the procedure well.

## 2014-05-13 LAB — GLUCOSE TOLERANCE, 2 HOURS
GLUCOSE, FASTING: 84 mg/dL (ref 70–99)
Glucose, 2 hour: 75 mg/dL (ref 70–139)

## 2014-05-24 ENCOUNTER — Encounter: Payer: Self-pay | Admitting: *Deleted

## 2014-06-12 ENCOUNTER — Encounter: Payer: Self-pay | Admitting: Family Medicine

## 2014-06-25 ENCOUNTER — Encounter (HOSPITAL_COMMUNITY): Payer: Self-pay | Admitting: *Deleted

## 2014-06-25 ENCOUNTER — Emergency Department (HOSPITAL_COMMUNITY)
Admission: EM | Admit: 2014-06-25 | Discharge: 2014-06-25 | Disposition: A | Discharge status: Home / Self Care | Payer: Medicaid Other | Attending: Emergency Medicine | Admitting: Emergency Medicine

## 2014-06-25 DIAGNOSIS — Z8719 Personal history of other diseases of the digestive system: Secondary | ICD-10-CM | POA: Insufficient documentation

## 2014-06-25 DIAGNOSIS — Z3202 Encounter for pregnancy test, result negative: Secondary | ICD-10-CM | POA: Insufficient documentation

## 2014-06-25 DIAGNOSIS — Z8632 Personal history of gestational diabetes: Secondary | ICD-10-CM | POA: Insufficient documentation

## 2014-06-25 DIAGNOSIS — R1011 Right upper quadrant pain: Secondary | ICD-10-CM | POA: Insufficient documentation

## 2014-06-25 DIAGNOSIS — Z87891 Personal history of nicotine dependence: Secondary | ICD-10-CM | POA: Diagnosis not present

## 2014-06-25 DIAGNOSIS — Z9889 Other specified postprocedural states: Secondary | ICD-10-CM | POA: Diagnosis not present

## 2014-06-25 DIAGNOSIS — Z79899 Other long term (current) drug therapy: Secondary | ICD-10-CM | POA: Insufficient documentation

## 2014-06-25 DIAGNOSIS — Z880 Allergy status to penicillin: Secondary | ICD-10-CM | POA: Insufficient documentation

## 2014-06-25 DIAGNOSIS — E669 Obesity, unspecified: Secondary | ICD-10-CM | POA: Insufficient documentation

## 2014-06-25 DIAGNOSIS — R110 Nausea: Secondary | ICD-10-CM | POA: Diagnosis not present

## 2014-06-25 DIAGNOSIS — Z793 Long term (current) use of hormonal contraceptives: Secondary | ICD-10-CM | POA: Diagnosis not present

## 2014-06-25 DIAGNOSIS — R197 Diarrhea, unspecified: Secondary | ICD-10-CM | POA: Diagnosis not present

## 2014-06-25 DIAGNOSIS — M549 Dorsalgia, unspecified: Secondary | ICD-10-CM | POA: Insufficient documentation

## 2014-06-25 LAB — COMPREHENSIVE METABOLIC PANEL
ALT: 21 U/L (ref 0–35)
AST: 18 U/L (ref 0–37)
Albumin: 3.3 g/dL — ABNORMAL LOW (ref 3.5–5.2)
Alkaline Phosphatase: 56 U/L (ref 39–117)
Anion gap: 12 (ref 5–15)
BILIRUBIN TOTAL: 0.3 mg/dL (ref 0.3–1.2)
BUN: 12 mg/dL (ref 6–23)
CO2: 24 meq/L (ref 19–32)
CREATININE: 0.86 mg/dL (ref 0.50–1.10)
Calcium: 8.3 mg/dL — ABNORMAL LOW (ref 8.4–10.5)
Chloride: 100 mEq/L (ref 96–112)
GFR calc non Af Amer: >90 mL/min (ref >90)
GLUCOSE: 96 mg/dL (ref 70–99)
Potassium: 3.8 mEq/L (ref 3.7–5.3)
Sodium: 136 mEq/L — ABNORMAL LOW (ref 137–147)
Total Protein: 6.1 g/dL (ref 6.0–8.3)

## 2014-06-25 LAB — LIPASE, BLOOD: LIPASE: 47 U/L (ref 11–59)

## 2014-06-25 LAB — CBC WITH DIFFERENTIAL/PLATELET
Basophils Absolute: 0 10*3/uL (ref 0.0–0.1)
Basophils Relative: 0 % (ref 0–1)
Eosinophils Absolute: 0.3 10*3/uL (ref 0.0–0.7)
Eosinophils Relative: 3 % (ref 0–5)
HCT: 44 % (ref 36.0–46.0)
Hemoglobin: 14.8 g/dL (ref 12.0–15.0)
LYMPHS ABS: 4.2 10*3/uL — AB (ref 0.7–4.0)
LYMPHS PCT: 36 % (ref 12–46)
MCH: 28.5 pg (ref 26.0–34.0)
MCHC: 33.6 g/dL (ref 30.0–36.0)
MCV: 84.8 fL (ref 78.0–100.0)
MONO ABS: 0.8 10*3/uL (ref 0.1–1.0)
Monocytes Relative: 7 % (ref 3–12)
Neutro Abs: 6.3 10*3/uL (ref 1.7–7.7)
Neutrophils Relative %: 54 % (ref 43–77)
Platelets: 271 10*3/uL (ref 150–400)
RBC: 5.19 MIL/uL — ABNORMAL HIGH (ref 3.87–5.11)
RDW: 16.7 % — ABNORMAL HIGH (ref 11.5–15.5)
WBC: 11.7 10*3/uL — ABNORMAL HIGH (ref 4.0–10.5)

## 2014-06-25 LAB — URINALYSIS, ROUTINE W REFLEX MICROSCOPIC
Bilirubin Urine: NEGATIVE
GLUCOSE, UA: NEGATIVE mg/dL
Hgb urine dipstick: NEGATIVE
KETONES UR: NEGATIVE mg/dL
LEUKOCYTES UA: NEGATIVE
Nitrite: NEGATIVE
PROTEIN: NEGATIVE mg/dL
Specific Gravity, Urine: 1.003 — ABNORMAL LOW (ref 1.005–1.030)
Urobilinogen, UA: 0.2 mg/dL (ref 0.0–1.0)
pH: 6.5 (ref 5.0–8.0)

## 2014-06-25 LAB — PREGNANCY, URINE: Preg Test, Ur: NEGATIVE

## 2014-06-25 MED ORDER — ONDANSETRON 4 MG PO TBDP: 4.0000 mg | ORAL_TABLET | Freq: Three times a day (TID) | ORAL | Status: DC | PRN

## 2014-06-25 MED ORDER — ONDANSETRON 4 MG PO TBDP
8.0000 mg | ORAL_TABLET | Freq: Once | ORAL | Status: AC
  Administered 2014-06-25: 8 mg via ORAL
  Filled 2014-06-25: qty 2

## 2014-06-25 MED ORDER — ONDANSETRON 4 MG PO TBDP: 4.0000 mg | ORAL_TABLET | Freq: Once | ORAL | Status: DC

## 2014-06-25 NOTE — ED Notes (Addendum)
Patient states unable to void at this time.  Will continue to monitor.  

## 2014-06-25 NOTE — ED Provider Notes (Signed)
CSN: [id number]     Arrival date & time 06/25/14  1907 History   First MD Initiated Contact with Patient 06/25/14 2100     Chief Complaint  Patient presents with  . Abdominal Pain  . Back Pain  . Nausea     (Consider location/radiation/quality/duration/timing/severity/associated sxs/prior Treatment) HPI Comments: This AM noted intermittent R flank pain radiating to abdomen with slight nausea, no vomiting States for the past couple of days has had 1-2 loose stools daily Has 1 sexual partner Denies vaginal discharge, dysuria, constipation, fever, pain associated with food consumption   Patient is a 28 y.o. female presenting with abdominal pain and back pain. The history is provided by the patient.  Abdominal Pain Pain location:  R flank and RUQ Pain quality: cramping and dull   Pain radiates to:  Does not radiate Pain severity:  Mild Onset quality:  Unable to specify Timing:  Intermittent Chronicity:  New Context: not diet changes, not eating, not laxative use and not trauma   Relieved by:  None tried Worsened by:  Nothing tried Ineffective treatments:  None tried Associated symptoms: nausea   Associated symptoms: no anorexia, no constipation, no cough, no dysuria, no fever, no shortness of breath, no vaginal bleeding, no vaginal discharge and no vomiting   Associated symptoms comment:  1-2 loose stool daily  Risk factors: obesity   Back Pain Quality:  Aching Pain severity:  Mild Onset quality:  Unable to specify Duration:  12 hours Timing:  Intermittent Progression:  Unchanged Chronicity:  New Relieved by:  None tried Worsened by:  Nothing tried Ineffective treatments:  None tried Associated symptoms: abdominal pain   Associated symptoms: no abdominal swelling, no bladder incontinence, no bowel incontinence, no dysuria, no fever, no paresthesias, no pelvic pain and no weakness   Risk factors: obesity     Past Medical History  Diagnosis Date  . 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 Gestational diabetes 2015    diet controlled   Past Surgical History  Procedure Laterality Date  . Abdominal surgery      Surgery as infant   Family History  Problem Relation Age of Onset  . Diabetes Father   . Alcohol abuse Father   . Alcohol abuse Mother   . Miscarriages / [place] Mother   . Cancer Maternal Aunt   . Depression Maternal Aunt   . Cancer Maternal Grandfather   . Cancer Paternal Grandfather   . Early death Paternal Grandfather    History  Substance Use Topics  . Smoking status: Former Smoker -- 0.50 packs/day for 10 years    Types: Cigarettes    Quit date: 08/11/2013  . Smokeless tobacco: Never Used  . Alcohol Use: No   OB History    Gravida Para Term Preterm AB TAB SAB Ectopic Multiple Living   1 1 1  0 0 0 0 0 0 1     Review of Systems  Constitutional: Negative for fever.  Respiratory: Negative for cough and shortness of breath.   Gastrointestinal: Positive for nausea and abdominal pain. Negative for vomiting, constipation, anorexia and bowel incontinence.       1-2 loose BM daily   Genitourinary: Negative for bladder incontinence, dysuria, urgency, frequency, flank pain, vaginal bleeding, vaginal discharge and pelvic pain.  Musculoskeletal: Positive for back pain.  Neurological: Negative for weakness and paresthesias.  All other systems reviewed and are negative.     Allergies  Penicillins  Home Medications   Prior to Admission medications   Medication Sig Start Date End Date Taking? Authorizing Provider  etonogestrel (NEXPLANON) 68 MG IMPL implant Inject 1 each (68 mg total) into the skin once. 05/12/14  Yes Levie HeritageJacob J Stinson, DO  Prenatal Vit-Fe Fumarate-FA (PRENATAL MULTIVITAMIN) TABS tablet Take 1 tablet by mouth daily at 12 noon.   Yes Historical Provider, MD  ondansetron (ZOFRAN-ODT) 4 MG disintegrating tablet Take 1 tablet (4 mg total) by mouth every 8 (eight) hours as  needed for nausea or vomiting. 06/25/14   Arman FilterGail K Kailon Treese, NP   BP 104/65 mmHg  Pulse 51  Temp(Src) 97.8 F (36.6 C) (Oral)  Resp 10  Ht 5\' 2"  (1.575 m)  Wt 180 lb (81.647 kg)  BMI 32.91 kg/m2  SpO2 100% Physical Exam  Constitutional: She appears well-developed and well-nourished.  obese  HENT:  Head: Normocephalic.  Eyes: Pupils are equal, round, and reactive to light.  Neck: Normal range of motion.  Cardiovascular: Normal rate and regular rhythm.   Pulmonary/Chest: Effort normal.  Abdominal: Soft. Bowel sounds are normal. She exhibits no distension. There is no tenderness. There is no rebound and no guarding.  Musculoskeletal: Normal range of motion.  Skin: Skin is warm. No rash noted.  Nursing note and vitals reviewed.   ED Course  Procedures (including critical care time) Labs Review Labs Reviewed  CBC WITH DIFFERENTIAL - Abnormal; Notable for the following:    WBC 11.7 (*)    RBC 5.19 (*)    RDW 16.7 (*)    Lymphs Abs 4.2 (*)    All other components within normal limits  COMPREHENSIVE METABOLIC PANEL - Abnormal; Notable for the following:    Sodium 136 (*)    Calcium 8.3 (*)    Albumin 3.3 (*)    All other components within normal limits  URINALYSIS, ROUTINE W REFLEX MICROSCOPIC - Abnormal; Notable for the following:    Specific Gravity, Urine 1.003 (*)    All other components within normal limits  LIPASE, BLOOD  PREGNANCY, URINE    Imaging Review No results found.   EKG Interpretation None      MDM  Reviewed labs and urine abdominal exam normal at this time will Rx Zofran for symptoms control Patient to return if develops  new symptoms Back pain most like from lifting newborn  Final diagnoses:  Nausea  Diarrhea         Arman FilterGail K Rashada Klontz, NP 06/25/14 [id number]  Lyanne CoKevin M Campos, MD 06/28/14 [phone]

## 2014-06-25 NOTE — Discharge Instructions (Signed)
As  Discussed your labs are normal you do not have a urinary tract infection  You have been give a prescription to help control the nausea, and instructions for diet modification to help with the diarrhea  If you develop new or worsening symptoms please return for further evaluation

## 2014-06-25 NOTE — ED Notes (Signed)
Pt c/o generalized abdominal pain radiating to lower back starting today. Pain is associated with nausea. Pt denies v/d/constipation, last BM today and normal. Denies dysuria, discharge, bleeding. Pt has irregular period because of implant birthcontrol

## 2014-10-05 ENCOUNTER — Telehealth (HOSPITAL_COMMUNITY): Payer: Self-pay | Admitting: *Deleted

## 2014-10-05 ENCOUNTER — Encounter (HOSPITAL_COMMUNITY): Payer: Self-pay | Admitting: *Deleted

## 2014-10-05 ENCOUNTER — Emergency Department (INDEPENDENT_AMBULATORY_CARE_PROVIDER_SITE_OTHER)
Admission: EM | Admit: 2014-10-05 | Discharge: 2014-10-05 | Disposition: A | Discharge status: Home / Self Care | Payer: Self-pay | Source: Home / Self Care | Attending: Emergency Medicine | Admitting: Emergency Medicine

## 2014-10-05 DIAGNOSIS — L519 Erythema multiforme, unspecified: Secondary | ICD-10-CM

## 2014-10-05 MED ORDER — HYDROXYZINE HCL 25 MG PO TABS: 25.0000 mg | ORAL_TABLET | Freq: Four times a day (QID) | ORAL | Status: DC | PRN

## 2014-10-05 MED ORDER — METHYLPREDNISOLONE ACETATE 80 MG/ML IJ SUSP
80.0000 mg | Freq: Once | INTRAMUSCULAR | Status: AC
  Administered 2014-10-05: 80 mg via INTRAMUSCULAR

## 2014-10-05 MED ORDER — PREDNISONE 20 MG PO TABS: ORAL_TABLET | ORAL | Status: DC

## 2014-10-05 MED ORDER — METHYLPREDNISOLONE ACETATE 80 MG/ML IJ SUSP
INTRAMUSCULAR | Status: AC
  Filled 2014-10-05: qty 1

## 2014-10-05 NOTE — ED Provider Notes (Signed)
   Chief Complaint    Rash   History of Present Illness      Suzanne Lane is a 29 year old female who has had a two-day history of a rash that first appeared on her thighs, then spread to the palms of her hands and her wrists. The rash is itchy. She denies any fever or chills. No URI symptoms or fever blisters. No headache or stiff neck. She has felt tired and rundown. She denies any coughing. No GI symptoms. No difficulty breathing. She's on no new medications or no foods. She denies any bites or stings. No tick bites. No exposure to any allergens such as change in soaps, detergents, washing powder, dryer sheet, or fabric softener.  Review of Systems   Other than as noted above, the patient denies any of the following symptoms: Systemic:  No fever or chills. ENT:  No nasal congestion, rhinorrhea, sore throat, swelling of lips, tongue or throat. Resp:  No cough, wheezing, or shortness of breath.  [place]    Past medical history, family history, social history, meds, and allergies were reviewed. She is allergic to penicillins.  Physical Exam     Vital signs:  BP 119/79 mmHg  Pulse 76  Temp(Src) 98.1 F (36.7 C) (Oral)  Resp 20  SpO2 99%  LMP 09/21/2014 Gen:  Alert, oriented, in no distress. ENT:  Pharynx clear, no intraoral lesions, moist mucous membranes. Lungs:  Clear to auscultation. Skin:  There are large, annular, target or bull's-eye lesions on the palms of her hands, a few on her forearms, some on her thighs, otherwise her skin was clear.     Course in Urgent [place]     The following meds were given:  Medications  methylPREDNISolone acetate (DEPO-MEDROL) injection 80 mg (80 mg Intramuscular Given 10/05/14 1722)    Serologies for RPR and Lyme disease were obtained.   Assessment    The encounter diagnosis was Erythema multiforme.  Plan     1.  Meds:  The following meds were prescribed:   Discharge Medication List as of 10/05/2014  5:02 PM    START  taking these medications   Details  hydrOXYzine (ATARAX/VISTARIL) 25 MG tablet Take 1 tablet (25 mg total) by mouth every 6 (six) hours as needed for itching., Starting 10/05/2014, Until Discontinued, Normal    predniSONE (DELTASONE) 20 MG tablet Take 3 daily for 5 days, 2 daily for 5 days, 1 daily for 5 days., Normal        2.  Patient Education/Counseling:  The patient was given appropriate handouts, self care instructions, and instructed in symptomatic relief.    3.  Follow up:  The patient was told to follow up here in 3 days for recheck.  Harden Mo, MD 10/05/14 2200

## 2014-10-05 NOTE — Discharge Instructions (Signed)
Erythema Multiforme °Erythema multiforme (EM) is a rash that occurs mostly on the skin. Sometimes it occurs on the lips and mouth. It is usually a mild illness that goes away on its own. It usually affects young adults in the spring and fall. It tends to be recurrent with each episode lasting 1 to 4 weeks. °CAUSES  °The cause of EM may be an overreaction by the body's immune system to a trigger (something that causes the body to react).  °Common triggers include: °· Infections, including: °¨ Viruses. °¨ Bacteria. °¨ Fungi. °¨ Parasites. °· Medicines. °Less common triggers include: °· Foods. °· Chemicals. °· Injuries to the skin. °· Pregnancy. °· Other illnesses. °In some cases the cause may not be known. °SYMPTOMS  °The rash from EM shows up suddenly. The rash may appear days after the trigger. It may start as small, red, round or oval marks that become bumps or raised welts over 24 to 48 hours. These can spread and be quite large (about one inch [several centimeters]). These skin changes usually appear first on the backs of the hands, then spread to the tops of the feet, arms, elbows, knees, palms and soles. There may be a mild rash on the lips and lining of the mouth. The skin rash may show up in waves over a few days. There may be mild itching or burning of the skin at first. It may take up to 4 weeks to go away. °The rash may come back again at a later time. °DIAGNOSIS  °Diagnosis of EM is usually made by physical exam. Sometimes a skin biopsy is done if the diagnosis is not certain. A skin biopsy is the removal of a small piece of tissue which can be examined under a microscope by a specialist (pathologist). °TREATMENT  °Most episodes of EM heal on their own and treatment may not be needed. If possible, it is best to remove the trigger or treat the infection. If your trigger is a herpes virus infection (cold sore), use sunscreen lotion and sunscreen-containing lip balm to prevent sunlight triggered outbreaks of  herpes virus. Medicine for itching may be given. Medicines can be used for severe cases and to prevent repeat bouts of EM.  °HOME CARE INSTRUCTIONS  °· If possible, avoid known triggers. °· If a medicine was your trigger, be sure to notify all of your caregivers. You should avoid this medicine or any like it in the future. °SEEK MEDICAL CARE IF:  °· Your EM rash shows up again in the future °SEEK IMMEDIATE MEDICAL CARE IF:  °· Red, swollen lips or mouth develop. °· Burning feeling in the mouth or lips. °· Blisters or open sores in the mouth, lips, vagina, penis or anus. °· Eye pain, redness or drainage. °· Blisters on the skin. °· Difficulty breathing. °· Difficulty swallowing; drooling. °· Blood in urine. °· Pain with urinating. °Document Released: 07/28/2005 Document Revised: 10/20/2011 Document Reviewed: 07/14/2008 °ExitCare® Patient Information ©2015 [employer]. This information is not intended to replace advice given to you by your health care provider. Make sure you discuss any questions you have with your health care provider. ° °

## 2014-10-05 NOTE — ED Notes (Signed)
Suzanne Lane said pt. called from her pharmacy with a question.  I called pt. back and left a message to call. Vassie MoselleYork, Samira Acero M 10/05/2014

## 2014-10-05 NOTE — ED Notes (Signed)
Pt  Has  Symptoms     Of     A  Rash  Over      Different  Parts  Of    Her  Body  That itch          She  Reports  The  Rash  Is  Spreading        -  She  denys  Any  Known  Causative       Agent  Such  As  New  meds  Or      Detergent      Or  Soaps    she  Displays      No  Angioedema   And  Is  Sitting  Upright on the  Exam table  Speaking in  Complete  sentances

## 2014-10-06 LAB — RPR: RPR: NONREACTIVE

## 2014-10-06 LAB — B. BURGDORFI ANTIBODIES: B burgdorferi Ab IgG+IgM: <0.91 {ISR} (ref 0.00–0.90)

## 2014-10-12 ENCOUNTER — Encounter (HOSPITAL_COMMUNITY): Payer: Self-pay | Admitting: *Deleted

## 2014-10-12 ENCOUNTER — Emergency Department (INDEPENDENT_AMBULATORY_CARE_PROVIDER_SITE_OTHER)
Admission: EM | Admit: 2014-10-12 | Discharge: 2014-10-12 | Disposition: A | Discharge status: Home / Self Care | Payer: Self-pay | Source: Home / Self Care | Attending: Family Medicine | Admitting: Family Medicine

## 2014-10-12 DIAGNOSIS — L519 Erythema multiforme, unspecified: Secondary | ICD-10-CM

## 2014-10-12 NOTE — ED Provider Notes (Signed)
CSN: [id number]     Arrival date & time 10/12/14  1448 History   First MD Initiated Contact with Patient 10/12/14 1632     Chief Complaint  Patient presents with  . Rash   (Consider location/radiation/quality/duration/timing/severity/associated sxs/prior Treatment) HPI Comments: Patient is a 29 year old female who has history of a rash that began on 10/03/2014 and first appeared on her thighs, then spread to the palms of her hands and her wrists. The rash is itchy. She denies any fever or chills. No URI symptoms or fever blisters. No headache or stiff neck.She denies any coughing. No GI symptoms. No difficulty breathing. She's on no new medications or no foods. She denies any bites or stings. No tick bites. No exposure to any allergens such as change in soaps, detergents, washing powder, dryer sheet, or fabric softener. Was seen for same at [facility] on 10/05/2014 and diagnosed with erythema multiforme and placed on hydroxyzine and prednisone. Reports improvement on these medications. States majority of rash on hands and palms has resolved but still has few scattered lesions on inner thighs of both legs. Was advised to follow up here for recheck. Denies development of mucous membrane lesions.  Serologies for Lyme's disease and syphilis both negative.   Patient is a 29 y.o. female presenting with rash.  Rash   Past Medical History  Diagnosis Date  . Small bowel obstruction     As infant with surgery; recent obstruction 2012  . Gestational diabetes 2015    diet controlled   Past Surgical History  Procedure Laterality Date  . Abdominal surgery      Surgery as infant   Family History  Problem Relation Age of Onset  . Diabetes Father   . Alcohol abuse Father   . Alcohol abuse Mother   . Miscarriages / [place] Mother   . Cancer Maternal Aunt   . Depression Maternal Aunt   . Cancer Maternal Grandfather   . Cancer Paternal Grandfather   . Early death Paternal Grandfather    History    Substance Use Topics  . Smoking status: Former Smoker -- 0.50 packs/day for 10 years    Types: Cigarettes    Quit date: 08/11/2013  . Smokeless tobacco: Never Used  . Alcohol Use: No   OB History    Gravida Para Term Preterm AB TAB SAB Ectopic Multiple Living   1 1 1  0 0 0 0 0 0 1     Review of Systems  Skin: Positive for rash.  All other systems reviewed and are negative.   Allergies  Penicillins  Home Medications   Prior to Admission medications   Medication Sig Start Date End Date Taking? Authorizing Provider  etonogestrel (NEXPLANON) 68 MG IMPL implant Inject 1 each (68 mg total) into the skin once. 05/12/14   Truett Mainland, DO  hydrOXYzine (ATARAX/VISTARIL) 25 MG tablet Take 1 tablet (25 mg total) by mouth every 6 (six) hours as needed for itching. 10/05/14   Harden Mo, MD  ondansetron (ZOFRAN-ODT) 4 MG disintegrating tablet Take 1 tablet (4 mg total) by mouth every 8 (eight) hours as needed for nausea or vomiting. 06/25/14   Garald Balding, NP  predniSONE (DELTASONE) 20 MG tablet Take 3 daily for 5 days, 2 daily for 5 days, 1 daily for 5 days. 10/05/14   Harden Mo, MD  Prenatal Vit-Fe Fumarate-FA (PRENATAL MULTIVITAMIN) TABS tablet Take 1 tablet by mouth daily at 12 noon.    Historical Provider, MD  BP 120/79 mmHg  Pulse 79  Temp(Src) 97.8 F (36.6 C) (Oral)  Resp 16  SpO2 96%  LMP 09/21/2014 Physical Exam  Constitutional: She is oriented to person, place, and time. She appears well-developed and well-nourished. No distress.  HENT:  Head: Normocephalic and atraumatic.  Right Ear: Hearing and external ear normal.  Left Ear: Hearing and external ear normal.  Nose: Nose normal.  Mouth/Throat: Uvula is midline, oropharynx is clear and moist and mucous membranes are normal. No oral lesions. No trismus in the jaw. No uvula swelling.  Eyes: Conjunctivae are normal. No scleral icterus.  Neck: Normal range of motion. Neck supple.  Cardiovascular: Normal rate,  regular rhythm and normal heart sounds.   Pulmonary/Chest: Effort normal and breath sounds normal.  Musculoskeletal: Normal range of motion.  Lymphadenopathy:    She has no cervical adenopathy.  Neurological: She is alert and oriented to person, place, and time.  Skin: Rash noted. No erythema.     Outlines areas are regions on skin with several scattered small discrete target lesions.   Psychiatric: She has a normal mood and affect. Her behavior is normal.  Nursing note and vitals reviewed.   ED Course  Procedures (including critical care time) Labs Review Labs Reviewed - No data to display  Imaging Review No results found.   MDM   1. Erythema multiforme    Rash is improving and patient has not developed any mucous membrane lesions, blisters or bulla. Advised to continue current management and establish with primary care provider for follow up. If symptoms suddenly worse or severe, patient advised to seek re-evaluation at her nearest ER.    Lutricia Feil, [place] 10/12/14 [phone]

## 2014-10-12 NOTE — Discharge Instructions (Signed)
Erythema Multiforme °Erythema multiforme (EM) is a rash that occurs mostly on the skin. Sometimes it occurs on the lips and mouth. It is usually a mild illness that goes away on its own. It usually affects young adults in the spring and fall. It tends to be recurrent with each episode lasting 1 to 4 weeks. °CAUSES  °The cause of EM may be an overreaction by the body's immune system to a trigger (something that causes the body to react).  °Common triggers include: °· Infections, including: °¨ Viruses. °¨ Bacteria. °¨ Fungi. °¨ Parasites. °· Medicines. °Less common triggers include: °· Foods. °· Chemicals. °· Injuries to the skin. °· Pregnancy. °· Other illnesses. °In some cases the cause may not be known. °SYMPTOMS  °The rash from EM shows up suddenly. The rash may appear days after the trigger. It may start as small, red, round or oval marks that become bumps or raised welts over 24 to 48 hours. These can spread and be quite large (about one inch [several centimeters]). These skin changes usually appear first on the backs of the hands, then spread to the tops of the feet, arms, elbows, knees, palms and soles. There may be a mild rash on the lips and lining of the mouth. The skin rash may show up in waves over a few days. There may be mild itching or burning of the skin at first. It may take up to 4 weeks to go away. °The rash may come back again at a later time. °DIAGNOSIS  °Diagnosis of EM is usually made by physical exam. Sometimes a skin biopsy is done if the diagnosis is not certain. A skin biopsy is the removal of a small piece of tissue which can be examined under a microscope by a specialist (pathologist). °TREATMENT  °Most episodes of EM heal on their own and treatment may not be needed. If possible, it is best to remove the trigger or treat the infection. If your trigger is a herpes virus infection (cold sore), use sunscreen lotion and sunscreen-containing lip balm to prevent sunlight triggered outbreaks of  herpes virus. Medicine for itching may be given. Medicines can be used for severe cases and to prevent repeat bouts of EM.  °HOME CARE INSTRUCTIONS  °· If possible, avoid known triggers. °· If a medicine was your trigger, be sure to notify all of your caregivers. You should avoid this medicine or any like it in the future. °SEEK MEDICAL CARE IF:  °· Your EM rash shows up again in the future °SEEK IMMEDIATE MEDICAL CARE IF:  °· Red, swollen lips or mouth develop. °· Burning feeling in the mouth or lips. °· Blisters or open sores in the mouth, lips, vagina, penis or anus. °· Eye pain, redness or drainage. °· Blisters on the skin. °· Difficulty breathing. °· Difficulty swallowing; drooling. °· Blood in urine. °· Pain with urinating. °Document Released: 07/28/2005 Document Revised: 10/20/2011 Document Reviewed: 07/14/2008 °ExitCare® Patient Information ©2015 [employer]. This information is not intended to replace advice given to you by your health care provider. Make sure you discuss any questions you have with your health care provider. ° °

## 2014-10-12 NOTE — ED Notes (Signed)
Pt  Was  Seen      1  Week  Ago    For  A  Rash        -     She  Was  rx   Prednisone         And  Was  Told  To  followup in  sev  Days   She  Missed  The  followup as  Well  As  Missing    Last  Nights  Prednisone     -  She  Reports  The  Rash  Persists       The  Pt  Is  Sitting  Upright on  The  Exam  Table       No  Angioedema    She  Is  Speaking  In  Complete  sentances  And  Is  In no   Acute  Distress

## 2014-11-13 ENCOUNTER — Emergency Department (INDEPENDENT_AMBULATORY_CARE_PROVIDER_SITE_OTHER)
Admission: EM | Admit: 2014-11-13 | Discharge: 2014-11-13 | Disposition: A | Discharge status: Home / Self Care | Payer: Self-pay | Source: Home / Self Care | Attending: Family Medicine | Admitting: Family Medicine

## 2014-11-13 ENCOUNTER — Encounter (HOSPITAL_COMMUNITY): Payer: Self-pay

## 2014-11-13 DIAGNOSIS — R197 Diarrhea, unspecified: Secondary | ICD-10-CM

## 2014-11-13 LAB — POCT PREGNANCY, URINE: Preg Test, Ur: NEGATIVE

## 2014-11-13 MED ORDER — CIPROFLOXACIN HCL 500 MG PO TABS: 500.0000 mg | ORAL_TABLET | Freq: Two times a day (BID) | ORAL | Status: DC

## 2014-11-13 NOTE — Discharge Instructions (Signed)
Thank you for coming in today. If your belly pain worsens, or you have high fever, bad vomiting, blood in your stool or black tarry stool go to the Emergency Room.  Take cipro twice daily for 5 days.  Return as needed.   Diarrhea Diarrhea is frequent loose and watery bowel movements. It can cause you to feel weak and dehydrated. Dehydration can cause you to become tired and thirsty, have a dry mouth, and have decreased urination that often is dark yellow. Diarrhea is a sign of another problem, most often an infection that will not last long. In most cases, diarrhea typically lasts 2-3 days. However, it can last longer if it is a sign of something more serious. It is important to treat your diarrhea as directed by your caregiver to lessen or prevent future episodes of diarrhea. CAUSES  Some common causes include:  Gastrointestinal infections caused by viruses, bacteria, or parasites.  Food poisoning or food allergies.  Certain medicines, such as antibiotics, chemotherapy, and laxatives.  Artificial sweeteners and fructose.  Digestive disorders. HOME CARE INSTRUCTIONS  Ensure adequate fluid intake (hydration): Have 1 cup (8 oz) of fluid for each diarrhea episode. Avoid fluids that contain simple sugars or sports drinks, fruit juices, whole milk products, and sodas. Your urine should be clear or pale yellow if you are drinking enough fluids. Hydrate with an oral rehydration solution that you can purchase at pharmacies, retail stores, and online. You can prepare an oral rehydration solution at home by mixing the following ingredients together:   - tsp table salt.   tsp baking soda.   tsp salt substitute containing potassium chloride.  1  tablespoons sugar.  1 L (34 oz) of water.  Certain foods and beverages may increase the speed at which food moves through the gastrointestinal (GI) tract. These foods and beverages should be avoided and include:  Caffeinated and alcoholic  beverages.  High-fiber foods, such as raw fruits and vegetables, nuts, seeds, and whole grain breads and cereals.  Foods and beverages sweetened with sugar alcohols, such as xylitol, sorbitol, and mannitol.  Some foods may be well tolerated and may help thicken stool including:  Starchy foods, such as rice, toast, pasta, low-sugar cereal, oatmeal, grits, baked potatoes, crackers, and bagels.  Bananas.  Applesauce.  Add probiotic-rich foods to help increase healthy bacteria in the GI tract, such as yogurt and fermented milk products.  Wash your hands well after each diarrhea episode.  Only take over-the-counter or prescription medicines as directed by your caregiver.  Take a warm bath to relieve any burning or pain from frequent diarrhea episodes. SEEK IMMEDIATE MEDICAL CARE IF:   You are unable to keep fluids down.  You have persistent vomiting.  You have blood in your stool, or your stools are black and tarry.  You do not urinate in 6-8 hours, or there is only a small amount of very dark urine.  You have abdominal pain that increases or localizes.  You have weakness, dizziness, confusion, or light-headedness.  You have a severe headache.  Your diarrhea gets worse or does not get better.  You have a fever or persistent symptoms for more than 2-3 days.  You have a fever and your symptoms suddenly get worse. MAKE SURE YOU:   Understand these instructions.  Will watch your condition.  Will get help right away if you are not doing well or get worse. Document Released: 07/18/2002 Document Revised: 12/12/2013 Document Reviewed: 04/04/2012 [facility]ExitCare Patient Information 2015 [place], [place]. This  information is not intended to replace advice given to you by your health care provider. Make sure you discuss any questions you have with your health care provider. ° °

## 2014-11-13 NOTE — ED Notes (Signed)
Reported history of intestine surgery as infant. Has had 1 week or so duration of diarrhea, bloating, nausea, abdominal and back discomfort

## 2014-11-13 NOTE — ED Provider Notes (Signed)
Clelia SchaumannSerita Zonia KiefStephens is a 29 y.o. female who presents to Urgent Care today for cramping bloating and diarrhea for 5 days. Patient had a history of small bowel resection as an infant with small bowel obstruction in 2012. She denies any pain or vomiting. She is passing flatus. No fevers or chills. No urinary symptoms.   Past Medical History  Diagnosis Date  . Small bowel obstruction     As infant with surgery; recent obstruction 2012  . Gestational diabetes 2015    diet controlled   Past Surgical History  Procedure Laterality Date  . Abdominal surgery      Surgery as infant   History  Substance Use Topics  . Smoking status: Former Smoker -- 0.50 packs/day for 10 years    Types: Cigarettes    Quit date: 08/11/2013  . Smokeless tobacco: Never Used  . Alcohol Use: No   ROS as above Medications: No current facility-administered medications for this encounter.   Current Outpatient Prescriptions  Medication Sig Dispense Refill  . etonogestrel (NEXPLANON) 68 MG IMPL implant Inject 1 each (68 mg total) into the skin once. 1 each 0  . ciprofloxacin (CIPRO) 500 MG tablet Take 1 tablet (500 mg total) by mouth 2 (two) times daily. 10 tablet 0  . Prenatal Vit-Fe Fumarate-FA (PRENATAL MULTIVITAMIN) TABS tablet Take 1 tablet by mouth daily at 12 noon.     Allergies  Allergen Reactions  . Penicillins Hives    Can take cephalosporins- no reaction     Exam:  BP 116/75 mmHg  Pulse 74  Temp(Src) 97.9 F (36.6 C) (Oral)  Resp 16  SpO2 100% Gen: Well NAD HEENT: EOMI,  MMM Lungs: Normal work of breathing. CTABL Heart: RRR no MRG Abd: NABS, Soft. Nondistended, Nontender Exts: Brisk capillary refill, warm and well perfused.   Results for orders placed or performed during the hospital encounter of 11/13/14 (from the past 24 hour(s))  Pregnancy, urine POC     Status: None   Collection Time: 11/13/14  6:19 PM  Result Value Ref Range   Preg Test, Ur NEGATIVE NEGATIVE   No results  found.  Assessment and Plan: 29 y.o. female with diarrhea and cramping. Stool culture and C. difficile pending. Treat with Cipro.  Discussed warning signs or symptoms. Please see discharge instructions. Patient expresses understanding.     Rodolph BongEvan S Corey, MD 11/13/14 [phone]

## 2014-11-14 LAB — CLOSTRIDIUM DIFFICILE BY PCR: Toxigenic C. Difficile by PCR: NEGATIVE

## 2014-11-15 NOTE — ED Notes (Signed)
C.diff neg., stool culture pending. 11/15/2014

## 2014-11-17 LAB — STOOL CULTURE: Special Requests: NORMAL

## 2015-04-14 ENCOUNTER — Emergency Department (INDEPENDENT_AMBULATORY_CARE_PROVIDER_SITE_OTHER)
Admission: EM | Admit: 2015-04-14 | Discharge: 2015-04-14 | Disposition: A | Discharge status: Home / Self Care | Payer: Self-pay | Source: Home / Self Care | Attending: Emergency Medicine | Admitting: Emergency Medicine

## 2015-04-14 ENCOUNTER — Encounter (HOSPITAL_COMMUNITY): Payer: Self-pay | Admitting: *Deleted

## 2015-04-14 DIAGNOSIS — S61411A Laceration without foreign body of right hand, initial encounter: Secondary | ICD-10-CM

## 2015-04-14 DIAGNOSIS — S61412A Laceration without foreign body of left hand, initial encounter: Secondary | ICD-10-CM

## 2015-04-14 NOTE — Discharge Instructions (Signed)
Leave the current bandage on for 24 hours.  Please wash the cut with soap and water twice a day.  You can apply Neosporin for the next 2 days. Please wear gloves when at work.

## 2015-04-14 NOTE — ED Provider Notes (Signed)
CSN: [id number]     Arrival date & time 04/14/15  1642 History   First MD Initiated Contact with Patient 04/14/15 1657     Chief Complaint  Patient presents with  . Laceration   (Consider location/radiation/quality/duration/timing/severity/associated sxs/prior Treatment) HPI  She is a 29 year old woman here for evaluation of hand laceration. She scraped her right hand across the screw a few hours ago. She states some of her skin was stuck in the screw. Her last tetanus was June of last year.  Past Medical History  Diagnosis Date  . Small bowel obstruction     As infant with surgery; recent obstruction 2012  . Gestational diabetes 2015    diet controlled   Past Surgical History  Procedure Laterality Date  . Abdominal surgery      Surgery as infant   Family History  Problem Relation Age of Onset  . Diabetes Father   . Alcohol abuse Father   . Alcohol abuse Mother   . Miscarriages / [place] Mother   . Cancer Maternal Aunt   . Depression Maternal Aunt   . Cancer Maternal Grandfather   . Cancer Paternal Grandfather   . Early death Paternal Grandfather    Social History  Substance Use Topics  . Smoking status: Former Smoker -- 0.50 packs/day for 10 years    Types: Cigarettes    Quit date: 08/11/2013  . Smokeless tobacco: Never Used  . Alcohol Use: No   OB History    Gravida Para Term Preterm AB TAB SAB Ectopic Multiple Living   0 0 0 0 0 0 1     Review of Systems As in history of present illness Allergies  Penicillins  Home Medications   Prior to Admission medications   Medication Sig Start Date End Date Taking? Authorizing Provider  ciprofloxacin (CIPRO) 500 MG tablet Take 1 tablet (500 mg total) by mouth 2 (two) times daily. 11/13/14   Rodolph Bong, MD  etonogestrel (NEXPLANON) 68 MG IMPL implant Inject 1 each (68 mg total) into the skin once. 05/12/14   Levie Heritage, DO  Prenatal Vit-Fe Fumarate-FA (PRENATAL MULTIVITAMIN) TABS tablet Take 1 tablet by  mouth daily at 12 noon.    Historical Provider, MD   Meds Ordered and Administered this Visit  Medications - No data to display  BP 108/75 mmHg  Pulse 86  Temp(Src) 98.3 F (36.8 C) (Oral)  Resp 16  SpO2 96%  LMP  (Approximate) No data found.   Physical Exam  Constitutional: She is oriented to person, place, and time. She appears well-developed and well-nourished. No distress.  Cardiovascular: Normal rate.   Pulmonary/Chest: Effort normal.  Neurological: She is alert and oriented to person, place, and time.  Skin:  Superficial abrasion to right palmar hand.    ED Course  Procedures (including critical care time)  Labs Review Labs Reviewed - No data to display  Imaging Review No results found.    MDM   1. Hand laceration, right, initial encounter    Wound cleaned and bandaged. Wound care instructions given. She is up-to-date on her tetanus. Follow-up as needed.    Charm Rings, MD 04/14/15 1728

## 2015-04-14 NOTE — ED Notes (Signed)
Pt     Reports       She  Sustained  A  lacertion  To her   r  Hand  Today  When  She  Snagged it on a  Screw  While  Unloading  Something off  Back of a  Truck     bleeding  Subsided

## 2015-06-11 ENCOUNTER — Other Ambulatory Visit (HOSPITAL_COMMUNITY)
Admission: RE | Admit: 2015-06-11 | Discharge: 2015-06-11 | Disposition: A | Discharge status: Home / Self Care | Payer: Self-pay | Source: Ambulatory Visit | Attending: Emergency Medicine | Admitting: Emergency Medicine

## 2015-06-11 ENCOUNTER — Emergency Department (INDEPENDENT_AMBULATORY_CARE_PROVIDER_SITE_OTHER)
Admission: EM | Admit: 2015-06-11 | Discharge: 2015-06-11 | Disposition: A | Discharge status: Home / Self Care | Payer: Self-pay | Source: Home / Self Care | Attending: Emergency Medicine | Admitting: Emergency Medicine

## 2015-06-11 ENCOUNTER — Encounter (HOSPITAL_COMMUNITY): Payer: Self-pay | Admitting: Emergency Medicine

## 2015-06-11 DIAGNOSIS — N3090 Cystitis, unspecified without hematuria: Secondary | ICD-10-CM | POA: Insufficient documentation

## 2015-06-11 LAB — POCT URINALYSIS DIP (DEVICE)
BILIRUBIN URINE: NEGATIVE
Glucose, UA: NEGATIVE mg/dL
Hgb urine dipstick: NEGATIVE
KETONES UR: NEGATIVE mg/dL
Leukocytes, UA: NEGATIVE
Nitrite: NEGATIVE
Protein, ur: NEGATIVE mg/dL
Specific Gravity, Urine: >=1.03 (ref 1.005–1.030)
Urobilinogen, UA: 0.2 mg/dL (ref 0.0–1.0)
pH: 6 (ref 5.0–8.0)

## 2015-06-11 LAB — POCT PREGNANCY, URINE: PREG TEST UR: NEGATIVE

## 2015-06-11 MED ORDER — NITROFURANTOIN MONOHYD MACRO 100 MG PO CAPS: 100.0000 mg | ORAL_CAPSULE | Freq: Two times a day (BID) | ORAL | Status: DC

## 2015-06-11 MED ORDER — PHENAZOPYRIDINE HCL 200 MG PO TABS: 200.0000 mg | ORAL_TABLET | Freq: Three times a day (TID) | ORAL | Status: DC | PRN

## 2015-06-11 NOTE — Discharge Instructions (Signed)
Your symptoms are consistent with a cystitis/urinary tract infection, so we are treating you for this although your urine dip was negative for any obvious infection. We're sending your urine off for culture. Give us a working phone number so that we can contact you if necessary. Follow up with your primary care physician as needed. Return here or go to the ER if you get worse.

## 2015-06-11 NOTE — ED Notes (Signed)
Pt here with c/o bilateral back pain, pressure with urination intermit x 2 weeks Denies hematuria, vaginal d/c, fever LMP- last week Urine sample obtained

## 2015-06-11 NOTE — ED Provider Notes (Signed)
. HPI  SUBJECTIVE:  Suzanne RushingSerita Lane is a 29 y.o. female who presents with bilateral sharp, intermittent back pain with radiation down both her flanks, pressure with urination for 2 weeks. . She reports dysuria and cloudy urine at first, but this has since resolved after she increased fluids. Symptoms are better with fluids, worse with sugary fluids. She has not tried anything else for this. She reports some nausea, no fever, vomiting, chills. No urinary urgency, frequency, odorous urine, hematuria, vaginal bleeding, vaginal discharge, vaginal odor, genital rash. States that she feels bloated, but denies any other abdominal pain. No abdominal distention. She is in a monogamous sexual relationship with a female partner who is asymptomatic. STDs are not a concern today. She had normal bowel movement today which did not affect her pain. It was normal. No antipyretic in past 4-6 hours. 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 No history of kidney stones. Family history significant for sister with nephrolithiasis. No history of gonorrhea, chlamydia, herpes, HIV, syphilis, Trichomonas, BV, yeast. LMP "last week". She is on Implanon.  Past Medical History  Diagnosis Date  . Small bowel obstruction (HCC)     As infant with surgery; recent obstruction 2012  . Gestational diabetes 2015    diet controlled    Past Surgical History  Procedure Laterality Date  . Abdominal surgery      Surgery as infant    Family History  Problem Relation Age of Onset  . Diabetes Father   . Alcohol abuse Father   . Alcohol abuse Mother   . Miscarriages [place]   . Cancer Maternal Aunt   . Depression Maternal Aunt   . Cancer Maternal Grandfather   . Cancer Paternal Grandfather   . Early death Paternal Grandfather     Social History  Substance Use Topics  . Smoking status: Former Smoker -- 0.50 packs/day for 10 years    Types: Cigarettes    Quit date:  08/11/2013  . Smokeless tobacco: Never Used  . Alcohol Use: No    No current facility-administered medications for this encounter.  Current outpatient prescriptions:  .  etonogestrel (NEXPLANON) 68 MG IMPL implant, Inject 1 each (68 mg total) into the skin once., Disp: 1 each, Rfl: 0 .  nitrofurantoin, macrocrystal-monohydrate, (MACROBID) 100 MG capsule, Take 1 capsule (100 mg total) by mouth 2 (two) times daily. X 5 days, Disp: 10 capsule, Rfl: 0 .  phenazopyridine (PYRIDIUM) 200 MG tablet, Take 1 tablet (200 mg total) by mouth 3 (three) times daily as needed for pain., Disp: 6 tablet, Rfl: 0 .  Prenatal Vit-Fe Fumarate-FA (PRENATAL MULTIVITAMIN) TABS tablet, Take 1 tablet by mouth daily at 12 noon., Disp: , Rfl:   Allergies  Allergen Reactions  . Penicillins Hives    Can take cephalosporins- no reaction     ROS  As noted in HPI.   Physical Exam  BP 104/71 mmHg  Pulse 52  Temp(Src) 98 F (36.7 C) (Oral)  Resp 16  SpO2 100%  LMP 05/28/2015  Constitutional: Well developed, well nourished, no acute distress Eyes:  EOMI, conjunctiva normal bilaterally HENT: Normocephalic, atraumatic,mucus membranes moist Respiratory: Normal inspiratory effort Cardiovascular: Normal rate GI: Soft, suprapubic tenderness, no flank tenderness. Normal bowel sounds. Normal appearance. No distention. Back: No CVAT GU: deferred skin: No rash, skin intact Musculoskeletal: no deformities Neurologic: Alert & oriented x 3, no focal neuro deficits Psychiatric: Speech and behavior appropriate   ED Course   Medications -  No data to display  Orders Placed This Encounter  Procedures  . Urine culture    Standing Status: Standing     Number of Occurrences: 1     Standing Expiration Date:     Order Specific Question:  List patient's active antibiotics    Answer:  macrobid    Order Specific Question:  Patient immune status    Answer:  Normal  . POCT urinalysis dip (device)    Standing Status:  Standing     Number of Occurrences: 1     Standing Expiration Date:   . Pregnancy, urine POC    Standing Status: Standing     Number of Occurrences: 1     Standing Expiration Date:     Results for orders placed or performed during the hospital encounter of 06/11/15 (from the past 24 hour(s))  POCT urinalysis dip (device)     Status: None   Collection Time: 06/11/15  4:12 PM  Result Value Ref Range   Glucose, UA NEGATIVE NEGATIVE mg/dL   Bilirubin Urine NEGATIVE NEGATIVE   Ketones, ur NEGATIVE NEGATIVE mg/dL   Specific Gravity, Urine >=1.030 1.005 - 1.030   Hgb urine dipstick NEGATIVE NEGATIVE   pH 6.0 5.0 - 8.0   Protein, ur NEGATIVE NEGATIVE mg/dL   Urobilinogen, UA 0.2 0.0 - 1.0 mg/dL   Nitrite NEGATIVE NEGATIVE   Leukocytes, UA NEGATIVE NEGATIVE  Pregnancy, urine POC     Status: None   Collection Time: 06/11/15  4:16 PM  Result Value Ref Range   Preg Test, Ur NEGATIVE NEGATIVE   No results found.  ED Clinical Impression  Cystitis   ED Assessment/Plan  She is not pregnant. Reviewed labs. No blood, no UTI. We'll send this off for culture. Given suprapubic tenderness, history of UTI, as patient is most consistent with a cystitis. May be interstitial cystitis. we will treat with Macrobid and Pyridium. Doubt GU cause of symptoms today. Patient has no GU complaints. Also 800 mg ibuprofen. patient states she has plenty of this at home. Push fluids. Follow-up with primary care physician or return here or go to ER if gets worse. Patient agrees with plan.   *This clinic note was created using Dragon dictation software. Therefore, there may be occasional mistakes despite careful proofreading.  ?   Domenick Gong, MD 06/11/15 [phone]

## 2015-06-12 LAB — URINE CULTURE: Special Requests: NORMAL

## 2015-06-12 NOTE — ED Notes (Signed)
Final report of urine C&S multiple species, consistent w contaminated sample

## 2015-10-07 IMAGING — US US OB COMP LESS 14 WK
1 series · 14 of 28 positions shown · non-contrast
Comparison: None relevant.

CLINICAL DATA: 27-year-old female with pain. Pregnant, gestational
age by first ultrasound 6 weeks and 4 days. Initial encounter.

EXAM:
OBSTETRIC <14 WK US AND TRANSVAGINAL OB US
TECHNIQUE: Both transabdominal and transvaginal ultrasound examinations were
performed for complete evaluation of the gestation as well as the
maternal uterus, adnexal regions, and pelvic cul-de-sac.
Transvaginal technique was performed to assess early pregnancy.

[Series 1: us ob comp less 14 wk · 0.20mm/px · 14 of 59 slices shown]
[im 3/59]
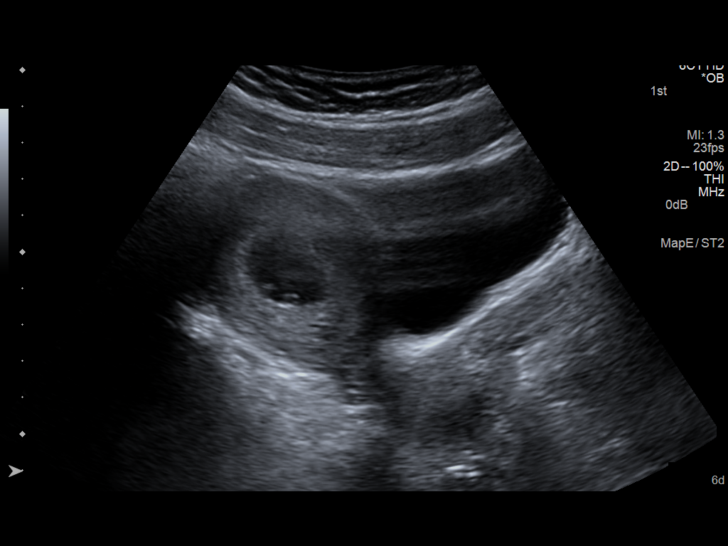
[im 7/59]
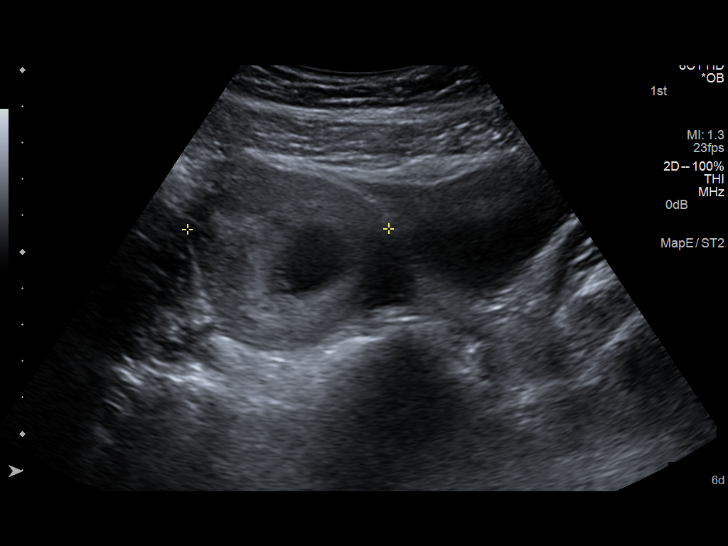
[im 11/59]
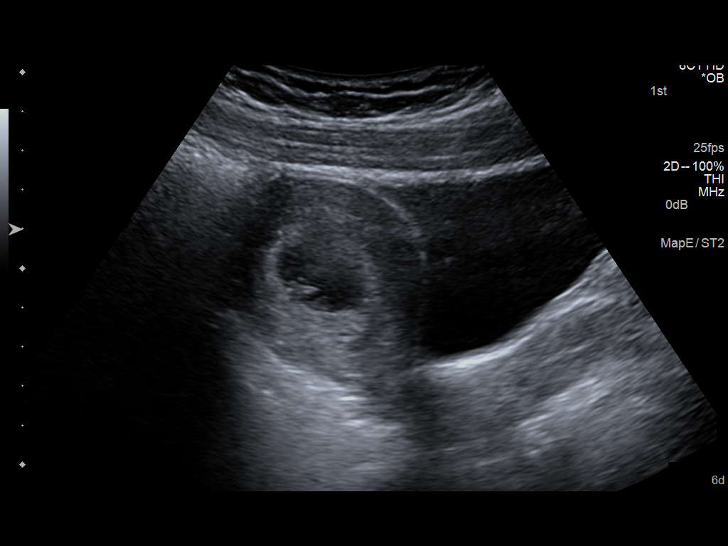
[im 16/59]
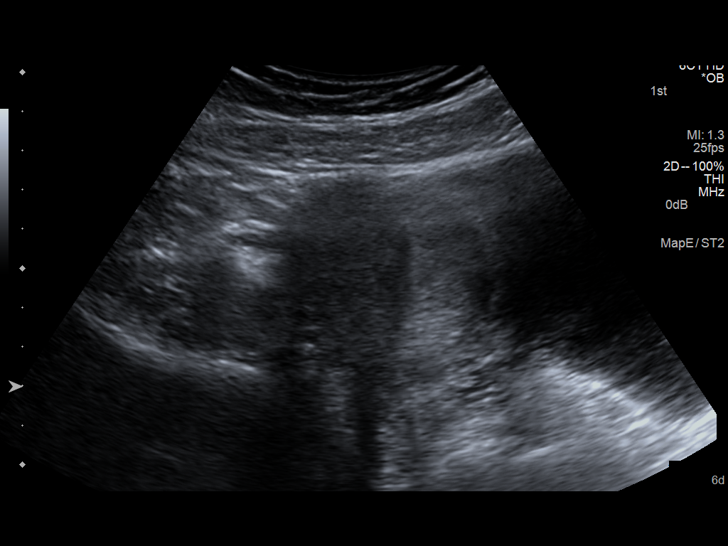
[im 20/59]
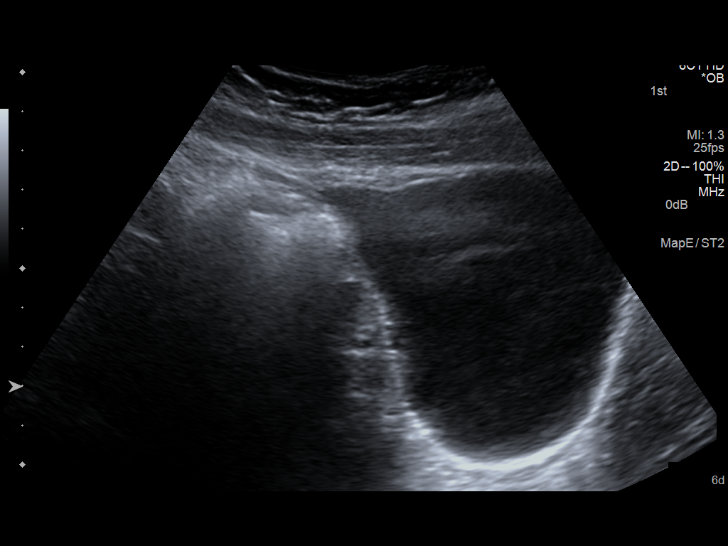
[im 24/59]
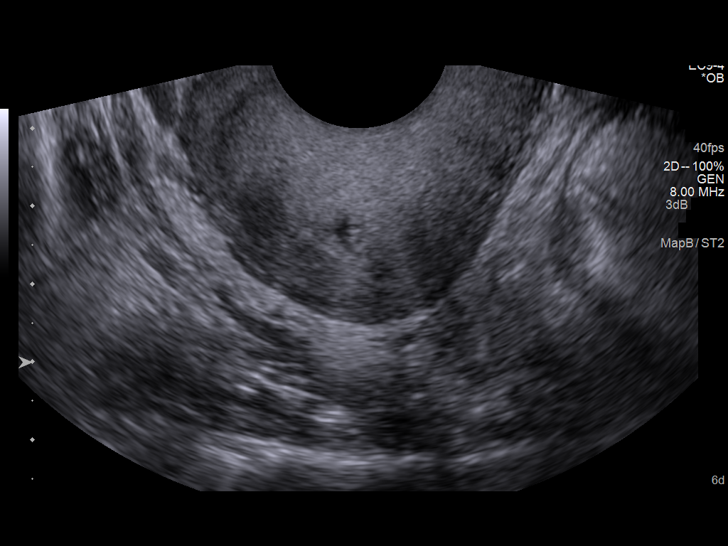
[im 28/59]
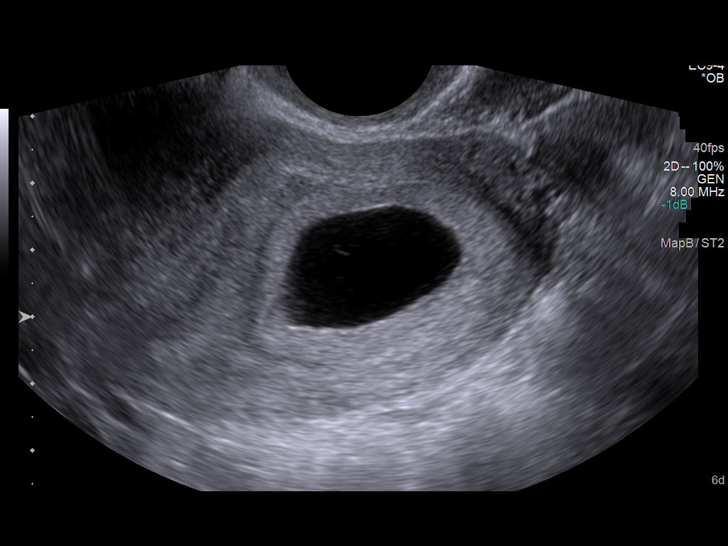
[im 33/59]
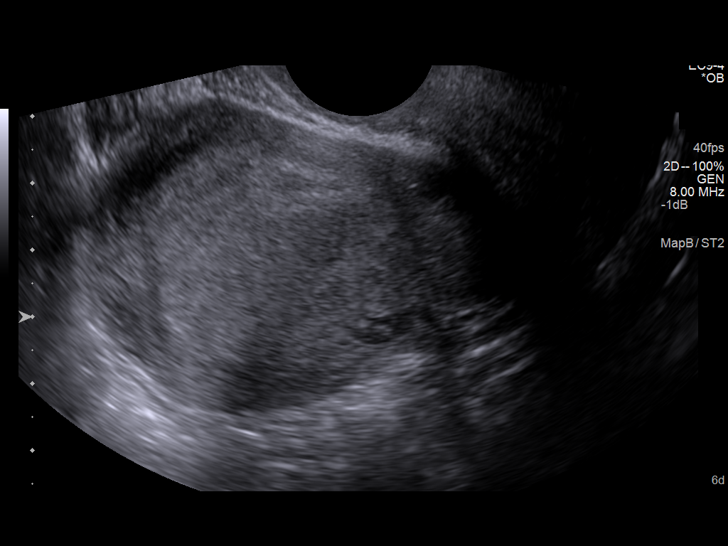
[im 37/59]
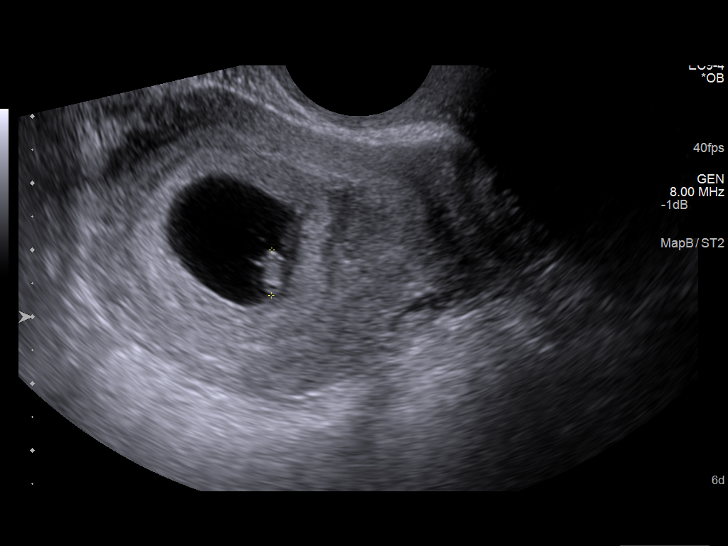
[im 41/59]
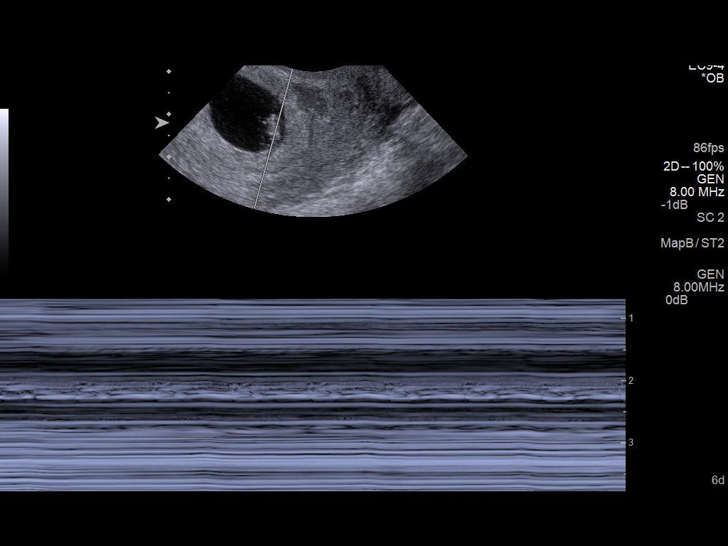
[im 46/59]
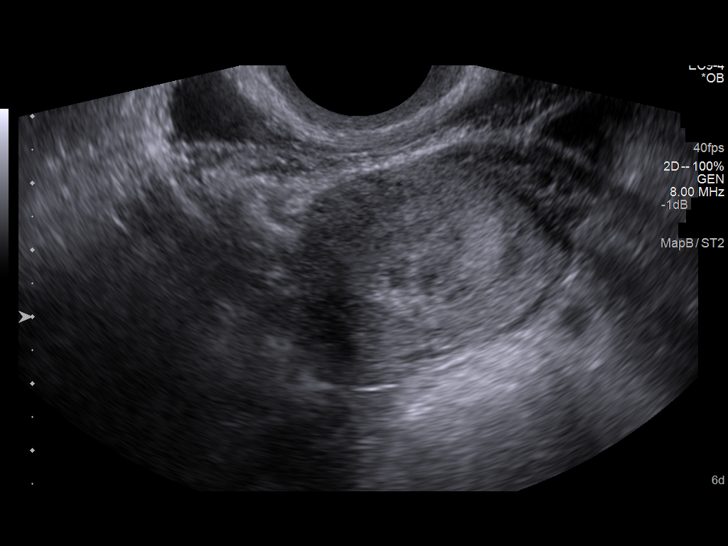
[im 50/59]
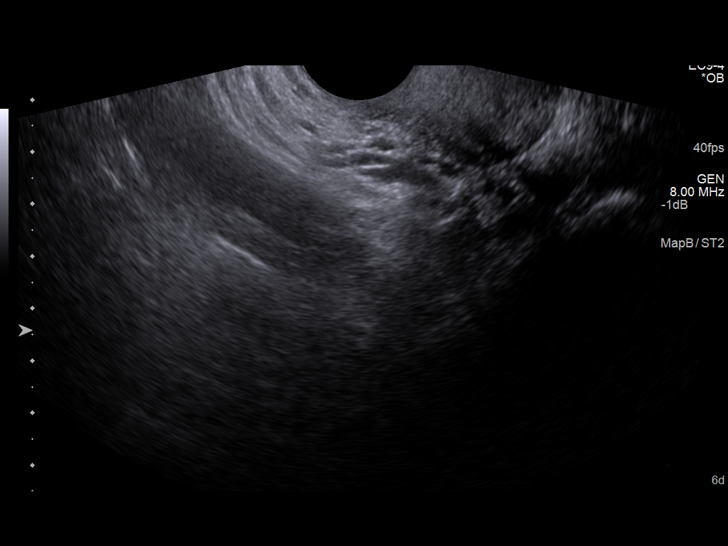
[im 54/59]
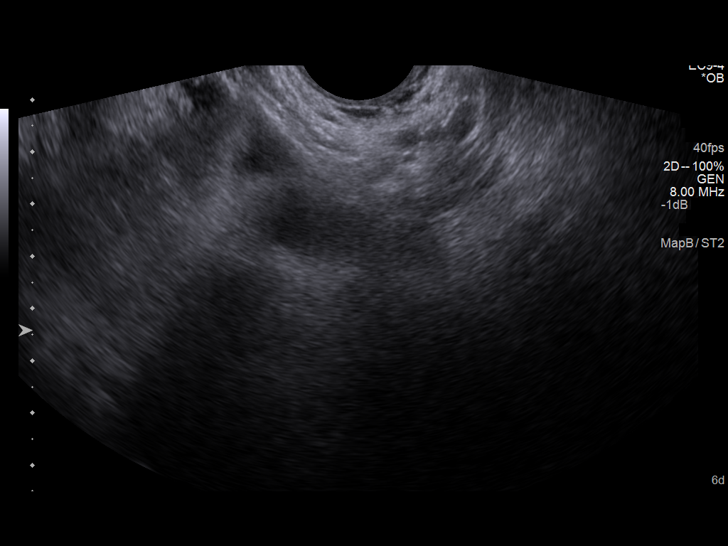
[im 59/59]
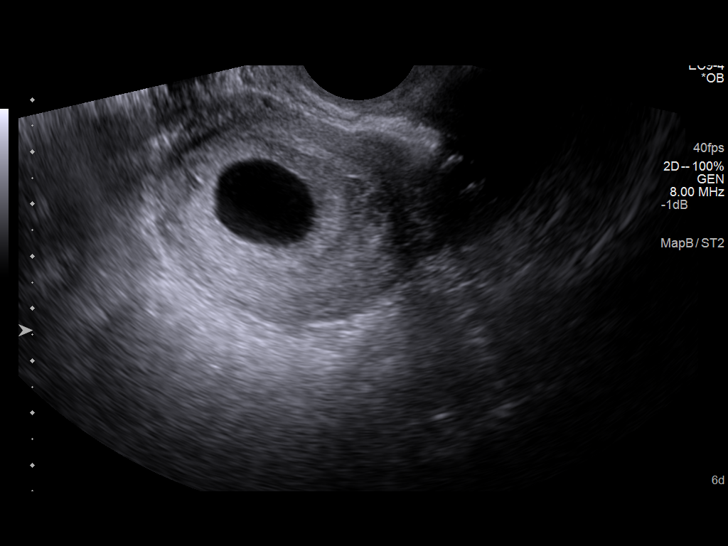

[14 of 28 positions shown; findings below may reference images not displayed]

FINDINGS: Intrauterine gestational sac: Single

Yolk sac:  Visible

Embryo:  Visible

Cardiac Activity: Detected

Heart Rate:  131 bpm

CRL:   7.4  mm   6 w for d                  US EDC: 04/15/2014

Maternal uterus/adnexae: No subchorionic hemorrhage or pelvic free
fluid. Despite repeated transabdominal and transvaginal imaging,
neither ovary is delineated. No adnexal region mass identified.
IMPRESSION: Single living IUP demonstrated. The ovaries could not be delineated,
but no acute maternal finding is visualized.

## 2016-06-26 ENCOUNTER — Emergency Department (HOSPITAL_COMMUNITY)
Admission: EM | Admit: 2016-06-26 | Discharge: 2016-06-26 | Disposition: A | Discharge status: Home / Self Care | Payer: No Typology Code available for payment source | Attending: Emergency Medicine | Admitting: Emergency Medicine

## 2016-06-26 ENCOUNTER — Emergency Department (HOSPITAL_COMMUNITY): Payer: No Typology Code available for payment source

## 2016-06-26 ENCOUNTER — Encounter (HOSPITAL_COMMUNITY): Payer: Self-pay | Admitting: Emergency Medicine

## 2016-06-26 DIAGNOSIS — Y92410 Unspecified street and highway as the place of occurrence of the external cause: Secondary | ICD-10-CM | POA: Insufficient documentation

## 2016-06-26 DIAGNOSIS — M25562 Pain in left knee: Secondary | ICD-10-CM | POA: Diagnosis not present

## 2016-06-26 DIAGNOSIS — S6992XA Unspecified injury of left wrist, hand and finger(s), initial encounter: Secondary | ICD-10-CM | POA: Diagnosis present

## 2016-06-26 DIAGNOSIS — S60812A Abrasion of left wrist, initial encounter: Secondary | ICD-10-CM | POA: Diagnosis not present

## 2016-06-26 DIAGNOSIS — M25532 Pain in left wrist: Secondary | ICD-10-CM

## 2016-06-26 DIAGNOSIS — Y999 Unspecified external cause status: Secondary | ICD-10-CM | POA: Diagnosis not present

## 2016-06-26 DIAGNOSIS — Y939 Activity, unspecified: Secondary | ICD-10-CM | POA: Insufficient documentation

## 2016-06-26 DIAGNOSIS — Z87891 Personal history of nicotine dependence: Secondary | ICD-10-CM | POA: Insufficient documentation

## 2016-06-26 DIAGNOSIS — S0091XA Abrasion of unspecified part of head, initial encounter: Secondary | ICD-10-CM | POA: Diagnosis not present

## 2016-06-26 MED ORDER — CYCLOBENZAPRINE HCL 5 MG PO TABS: 5.0000 mg | ORAL_TABLET | Freq: Two times a day (BID) | ORAL | 0 refills | Status: DC | PRN

## 2016-06-26 MED ORDER — NAPROXEN 375 MG PO TABS: 375.0000 mg | ORAL_TABLET | Freq: Two times a day (BID) | ORAL | 0 refills | Status: DC

## 2016-06-26 NOTE — ED Notes (Signed)
[facility] COUPON 236 GIVEN
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
## 2016-06-26 NOTE — ED Notes (Signed)
Patient returned from xray.

## 2016-06-26 NOTE — Discharge Instructions (Signed)

## 2016-06-26 NOTE — ED Notes (Signed)
Patient transported to X-ray 

## 2017-07-11 ENCOUNTER — Encounter (HOSPITAL_COMMUNITY): Payer: Self-pay | Admitting: Emergency Medicine

## 2017-07-11 ENCOUNTER — Other Ambulatory Visit: Payer: Self-pay

## 2017-07-11 ENCOUNTER — Ambulatory Visit (HOSPITAL_COMMUNITY)
Admission: EM | Admit: 2017-07-11 | Discharge: 2017-07-11 | Disposition: A | Discharge status: Home / Self Care | Payer: Self-pay | Attending: Internal Medicine | Admitting: Internal Medicine

## 2017-07-11 DIAGNOSIS — K0889 Other specified disorders of teeth and supporting structures: Secondary | ICD-10-CM

## 2017-07-11 MED ORDER — CLINDAMYCIN HCL 150 MG PO CAPS
150.0000 mg | ORAL_CAPSULE | Freq: Four times a day (QID) | ORAL | 0 refills | Status: AC
Start: 2017-07-11 — End: 2017-07-18

## 2017-07-11 NOTE — ED Provider Notes (Signed)
[facility]    CSN: [id number] Arrival date & time: 07/11/17  1808     History   Chief Complaint Chief Complaint  Patient presents with  . Dental Pain    HPI Suzanne Lane is a 31 y.o. female.   31 y.o. female presents with dental pain. Condition is acute on chronic and persistent in nature. Pateint has 3 known fractured teeth. Condition is made better by nothing. Condition is made worse by nothing. Patient denies any relief from ibuprofen prior to there arrival at this facility. Patient denies any fevers, nausea or vomitting.        Past Medical History:  Diagnosis Date  . Gestational diabetes 2015   diet controlled  . Small bowel obstruction (HCC)    As infant with surgery; recent obstruction 2012    Patient Active Problem List   Diagnosis Date Noted  . NSVD (normal spontaneous vaginal delivery) 03/30/2014  . Normal labor 03/28/2014  . Complicated UTI (urinary tract infection) 02/21/2014  . Pyelonephritis affecting pregnancy 02/21/2014  . Pyelonephritis affecting pregnancy in third trimester, antepartum 02/20/2014  . Gestational diabetes 02/02/2014  . First trimester screening 10/19/2013  . Supervision of normal pregnancy 09/28/2013  . UTI (urinary tract infection) during pregnancy 09/28/2013  . Small bowel obstruction [facility](HCC)     Past Surgical History:  Procedure Laterality Date  . ABDOMINAL SURGERY     Surgery as infant    OB History    Gravida Para Term Preterm AB Living   1 1 1  0 0 1   SAB TAB Ectopic Multiple Live Births   0 0 0 0 1       Home Medications    Prior to Admission medications   Medication Sig Start Date End Date Taking? Authorizing Provider  ibuprofen (ADVIL,MOTRIN) 200 MG tablet Take 200 mg by mouth every 6 (six) hours as needed.   Yes [provider]  cyclobenzaprine (FLEXERIL) 5 MG tablet Take 1 tablet (5 mg total) by mouth 2 (two) times daily as needed for muscle spasms. 06/26/16   Arthor CaptainHarris, Abigail, PA-C    etonogestrel (NEXPLANON) 68 MG IMPL implant Inject 1 each (68 mg total) into the skin once. 05/12/14   Levie HeritageStinson, Jacob J, DO  naproxen (NAPROSYN) 375 MG tablet Take 1 tablet (375 mg total) by mouth 2 (two) times daily. 06/26/16   Arthor CaptainHarris, Abigail, PA-C  nitrofurantoin, macrocrystal-monohydrate, (MACROBID) 100 MG capsule Take 1 capsule (100 mg total) by mouth 2 (two) times daily. X 5 days 06/11/15   Domenick GongMortenson, Ashley, MD  phenazopyridine (PYRIDIUM) 200 MG tablet Take 1 tablet (200 mg total) by mouth 3 (three) times daily as needed for pain. 06/11/15   Domenick GongMortenson, Ashley, MD  Prenatal Vit-Fe Fumarate-FA (PRENATAL MULTIVITAMIN) TABS tablet Take 1 tablet by mouth daily at 12 noon.    [provider]    Family History Family History  Problem Relation Age of Onset  . Diabetes Father   . Alcohol abuse Father   . Alcohol abuse Mother   . Miscarriages / [place] Mother   . Cancer Maternal Aunt   . Depression Maternal Aunt   . Cancer Maternal Grandfather   . Cancer Paternal Grandfather   . Early death Paternal Grandfather     Social History Social History   Tobacco Use  . Smoking status: Former Smoker    Packs/day: 0.50    Years: 10.00    Pack years: 5.00    Types: Cigarettes    Last attempt to quit:  08/11/2013    Years since quitting: 3.9  . Smokeless tobacco: Never Used  Substance Use Topics  . Alcohol use: No  . Drug use: No     Allergies   Penicillins   Review of Systems Review of Systems  Constitutional: Negative for chills.  HENT: Positive for dental problem and facial swelling. Negative for ear pain and sore throat.   Eyes: Negative for pain and visual disturbance.  Respiratory: Negative for cough and shortness of breath.   Cardiovascular: Negative for chest pain and palpitations.  Gastrointestinal: Negative for abdominal pain and vomiting.  Genitourinary: Negative for dysuria and hematuria.  Musculoskeletal: Negative for arthralgias and back pain.  Skin:  Negative for color change and rash.  Neurological: Negative for seizures and syncope.  All other systems reviewed and are negative.    Physical Exam Triage Vital Signs ED Triage Vitals  Enc Vitals Group     BP 07/11/17 1826 123/78     Pulse Rate 07/11/17 1826 78     Resp 07/11/17 1826 18     Temp 07/11/17 1826 98.3 F (36.8 C)     Temp Source 07/11/17 1826 Oral     SpO2 07/11/17 1826 98 %     Weight --      Height --      Head Circumference --      Peak Flow --      Pain Score 07/11/17 1823 9     Pain Loc --      Pain Edu? --      Excl. in GC? --    No data found.  Updated Vital Signs BP 123/78 (BP Location: Left Arm)   Pulse 78   Temp 98.3 F (36.8 C) (Oral)   Resp 18   SpO2 98%   Visual Acuity Right Eye Distance:   Left Eye Distance:   Bilateral Distance:    Right Eye Near:   Left Eye Near:    Bilateral Near:     Physical Exam  Constitutional: She is oriented to person, place, and time. She appears well-developed and well-nourished.  HENT:  Head: Atraumatic.  Facial swelling noted to left check, patient endorses pain and sensitivity.  Eyes: Conjunctivae are normal.  Neck: Normal range of motion.  Pulmonary/Chest: Effort normal.  Musculoskeletal: Normal range of motion.  Neurological: She is alert and oriented to person, place, and time.  Skin: Skin is warm.  Psychiatric: She has a normal mood and affect.  Nursing note and vitals reviewed.    UC Treatments / Results  Labs (all labs ordered are listed, but only abnormal results are displayed) Labs Reviewed - No data to display  EKG  EKG Interpretation None       Radiology No results found.  Procedures Procedures (including critical care time)  Medications Ordered in UC Medications - No data to display   Initial Impression / Assessment and Plan / UC Course  I have reviewed the triage vital signs and the nursing notes.  Pertinent labs & imaging results that were available during my  care of the patient were reviewed by me and considered in my medical decision making (see chart for details).       Final Clinical Impressions(s) / UC Diagnoses   Final diagnoses:  None    ED Discharge Orders    None       Controlled Substance Prescriptions [place] Controlled Substance Registry consulted? Not Applicable   Alene MiresOmohundro, Sean Malinowski C, NP 07/11/17 [phone]

## 2017-07-11 NOTE — ED Triage Notes (Signed)
Dental pain for 3 weeks.  Pain is getting worse.  Patient says pain involves left side of face, but thinks bottom, left where broken teeth are is the reason

## 2017-07-11 NOTE — Discharge Instructions (Signed)
Follow up with dental reference provided. Continue to take ibuprofen for pain control.

## 2018-05-31 ENCOUNTER — Ambulatory Visit (HOSPITAL_COMMUNITY)
Admission: EM | Admit: 2018-05-31 | Discharge: 2018-05-31 | Disposition: A | Discharge status: Home / Self Care | Payer: Self-pay | Attending: Family Medicine | Admitting: Family Medicine

## 2018-05-31 ENCOUNTER — Ambulatory Visit (INDEPENDENT_AMBULATORY_CARE_PROVIDER_SITE_OTHER): Payer: Self-pay

## 2018-05-31 ENCOUNTER — Encounter (HOSPITAL_COMMUNITY): Payer: Self-pay | Admitting: *Deleted

## 2018-05-31 ENCOUNTER — Other Ambulatory Visit: Payer: Self-pay

## 2018-05-31 DIAGNOSIS — W010XXA Fall on same level from slipping, tripping and stumbling without subsequent striking against object, initial encounter: Secondary | ICD-10-CM

## 2018-05-31 DIAGNOSIS — S2231XA Fracture of one rib, right side, initial encounter for closed fracture: Secondary | ICD-10-CM

## 2018-05-31 MED ORDER — TRAMADOL HCL 50 MG PO TABS: 50.0000 mg | ORAL_TABLET | Freq: Two times a day (BID) | ORAL | 0 refills | Status: DC | PRN

## 2018-05-31 MED ORDER — MELOXICAM 7.5 MG PO TABS: 7.5000 mg | ORAL_TABLET | Freq: Every day | ORAL | 0 refills | Status: DC

## 2018-05-31 MED ORDER — IBUPROFEN 800 MG PO TABS
ORAL_TABLET | ORAL | Status: AC
  Filled 2018-05-31: qty 1

## 2018-05-31 MED ORDER — IBUPROFEN 800 MG PO TABS
800.0000 mg | ORAL_TABLET | Freq: Once | ORAL | Status: AC
  Administered 2018-05-31: 800 mg via ORAL
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
## 2018-05-31 NOTE — Discharge Instructions (Signed)
It appears that there may be one non displaced fracture of your rib.  Pain management is treatment for this as this will heal without treatment.  Meloxicam daily.  Tramadol for breakthrough pain. May cause drowsiness. Please do not take if driving or drinking alcohol.   Ice application.  Splinting of the ribs with any coughing.  Please force a cough and/or deep breathing to prevent complication of pneumonia.  Please follow up with a primary care provider for recheck in the next month.  If develop worsening of pain, fevers, shortness of breath , cough, or otherwise worsening please return or go to the Er.

## 2018-05-31 NOTE — ED Triage Notes (Signed)
States she fell on Sat. At work and her right elbow went into her right side. C/o pain right lateral rib area.

## 2020-03-14 ENCOUNTER — Other Ambulatory Visit: Payer: Self-pay

## 2020-03-14 ENCOUNTER — Ambulatory Visit (INDEPENDENT_AMBULATORY_CARE_PROVIDER_SITE_OTHER): Payer: Self-pay | Admitting: *Deleted

## 2020-03-14 VITALS — BP 118/80 | HR 71 | Temp 98.2°F | Ht 61.0 in | Wt 180.0 lb

## 2020-03-14 DIAGNOSIS — Z3202 Encounter for pregnancy test, result negative: Secondary | ICD-10-CM

## 2020-03-14 DIAGNOSIS — Z3200 Encounter for pregnancy test, result unknown: Secondary | ICD-10-CM

## 2020-03-14 LAB — POCT URINE PREGNANCY: Preg Test, Ur: NEGATIVE

## 2020-03-14 NOTE — Progress Notes (Signed)
   Ms. Korf presents today for UPT. She has no unusual complaints. LMP: 01/29/20. Denies being on any form of birth control.    OBJECTIVE: Appears well, in no apparent distress.  OB History    Gravida  1   Para  1   Term  1   Preterm  0   AB  0   Living  1     SAB  0   TAB  0   Ectopic  0   Multiple  0   Live Births  1          Home UPT Result: Positive this morning. Took a pregnancy test last week, result negative In-Office UPT result: Negative x 2 I have reviewed the patient's medical, obstetrical, social, and family histories, and medications.   ASSESSMENT: Negative pregnancy test  PLAN: Will speak with midwife regarding order for beta hcg.

## 2020-03-15 ENCOUNTER — Other Ambulatory Visit: Payer: Self-pay | Admitting: *Deleted

## 2020-03-15 NOTE — Progress Notes (Signed)
   Patient in clinic for beta hcg. Patient had two negative urine test in clinic and 1 positive home pregnancy 02/12/20. Will call patient with results.  Clovis Pu, RN

## 2020-03-15 NOTE — Addendum Note (Signed)
Addended by: Clovis Pu on: 03/15/2020 09:21 AM   Modules accepted: Orders

## 2020-03-16 ENCOUNTER — Other Ambulatory Visit: Payer: Self-pay

## 2020-03-16 ENCOUNTER — Telehealth: Payer: Self-pay | Admitting: *Deleted

## 2020-03-16 ENCOUNTER — Encounter (HOSPITAL_COMMUNITY): Payer: Self-pay | Admitting: Family Medicine

## 2020-03-16 ENCOUNTER — Inpatient Hospital Stay (HOSPITAL_COMMUNITY)
Admission: AD | Admit: 2020-03-16 | Discharge: 2020-03-16 | Disposition: A | Discharge status: Home / Self Care | Payer: Self-pay | Attending: Family Medicine | Admitting: Family Medicine

## 2020-03-16 DIAGNOSIS — O021 Missed abortion: Secondary | ICD-10-CM

## 2020-03-16 DIAGNOSIS — O039 Complete or unspecified spontaneous abortion without complication: Secondary | ICD-10-CM | POA: Insufficient documentation

## 2020-03-16 LAB — BETA HCG QUANT (REF LAB): hCG Quant: 11 m[IU]/mL

## 2020-03-16 LAB — HCG, QUANTITATIVE, PREGNANCY: hCG, Beta Chain, Quant, S: 9 m[IU]/mL — ABNORMAL HIGH (ref <5)

## 2020-03-16 NOTE — MAU Note (Signed)
+  HPT the other day.  Neg UPT at [facility], was called back with HCG level was 11.  Pt to come for repeat tomorrow.  Started spotting yesterday, increased today and darker- still just seeing when she wipes.  Having some cramping, was told to come in.

## 2020-03-16 NOTE — MAU Provider Note (Signed)
Ms. Suzanne Lane  is a 34 y.o. G2P1001  at Unknown who presents to MAU today for follow-up quant hCG. Patient had positive home pregnancy test Wednesday morning. She presented to the [facility] office that afternoon & had 2 negative UPTs. HCG was drawn & resulted at 11. Patient called office today with report of vaginal spotting and mild abdominal cramping.    BP 128/72 (BP Location: Right Arm)   Pulse 67   Temp 97.8 F (36.6 C)   Resp 16   Ht 5\' 1"  (1.549 m)   Wt 93.1 kg   LMP 01/29/2020   SpO2 97%   BMI 38.77 kg/m   GENERAL: Well-developed, well-nourished female in no acute distress.  HEENT: Normocephalic, atraumatic.   LUNGS: Effort normal HEART: Regular rate  SKIN: Warm, dry and without erythema PSYCH: Normal mood and affect   A: 1. Miscarriage   -Positive HPT>negative HPT> hcg of 11 and now down to 9. This likely represent a miscarriage. Will have patient go to office on Monday to confirm that HCG is continuing to drop   P: Discharge home Bleeding/Ectopic precautions discussed Patient may return to MAU as needed or if her condition were to change or worsen   Tuesday, NP  03/16/2020 11:02 AM

## 2020-03-16 NOTE — Discharge Instructions (Signed)
Return to care   If you have heavier bleeding that soaks through more that 2 pads per hour for an hour or more  If you bleed so much that you feel like you might pass out or you do pass out  If you have significant abdominal pain that is not improved with Tylenol      Miscarriage A miscarriage is the loss of an unborn baby (fetus) before the 20th week of pregnancy. Follow these instructions at home: Medicines   Take over-the-counter and prescription medicines only as told by your doctor.  If you were prescribed antibiotic medicine, take it as told by your doctor. Do not stop taking the antibiotic even if you start to feel better.  Do not take NSAIDs unless your doctor says that this is safe for you. NSAIDs include aspirin and ibuprofen. These medicines can cause bleeding. Activity  Rest as directed. Ask your doctor what activities are safe for you.  Have someone help you at home during this time. General instructions  Write down how many pads you use each day and how soaked they are.  Watch the amount of tissue or clumps of blood (blood clots) that you pass from your vagina. Save any large amounts of tissue for your doctor.  Do not use tampons, douche, or have sex until your doctor approves.  To help you and your partner with the process of grieving, talk with your doctor or seek counseling.  When you are ready, meet with your doctor to talk about steps you should take for your health. Also, talk with your doctor about steps to take to have a healthy pregnancy in the future.  Keep all follow-up visits as told by your doctor. This is important. Contact a doctor if:  You have a fever or chills.  You have vaginal discharge that smells bad.  You have more bleeding. Get help right away if:  You have very bad cramps or pain in your back or belly.  You pass clumps of blood that are walnut-sized or larger from your vagina.  You pass tissue that is walnut-sized or larger  from your vagina.  You soak more than 1 regular pad in an hour.  You get light-headed or weak.  You faint (pass out).  You have feelings of sadness that do not go away, or you have thoughts of hurting yourself. Summary  A miscarriage is the loss of an unborn baby before the 20th week of pregnancy.  Follow your doctor's instructions for home care. Keep all follow-up appointments.  To help you and your partner with the process of grieving, talk with your doctor or seek counseling. This information is not intended to replace advice given to you by your health care provider. Make sure you discuss any questions you have with your health care provider. Document Revised: 11/19/2018 Document Reviewed: 09/02/2016 Elsevier Patient Education  2020 [employer].

## 2020-03-16 NOTE — Telephone Encounter (Signed)
-----   Message from Raelyn Mora, [place] sent at 03/16/2020  6:39 AM EDT ----- HCG was 11; which is clinically pregnant. She will need a repeat HCG on Saturday. Please advise her to go to MAU Saturday morning (before 12:00 pm) for the repeat HCG.

## 2020-03-16 NOTE — Telephone Encounter (Signed)
Spoke with patient this AM regarding beta Hcg results. Patient level is 11. Advised patient she should be seen at [facility] Saturday before noon for repeat lab draw. Patient reported that is she having some spotting and painful cramping. The spotting started yesterday. Advised to go to MAU today to be seen.  Clovis Pu, RN

## 2020-03-19 ENCOUNTER — Other Ambulatory Visit (INDEPENDENT_AMBULATORY_CARE_PROVIDER_SITE_OTHER): Payer: Self-pay | Admitting: *Deleted

## 2020-03-19 ENCOUNTER — Telehealth: Payer: Self-pay | Admitting: General Practice

## 2020-03-19 ENCOUNTER — Other Ambulatory Visit: Payer: Self-pay

## 2020-03-19 ENCOUNTER — Encounter: Payer: Self-pay | Admitting: General Practice

## 2020-03-19 DIAGNOSIS — O039 Complete or unspecified spontaneous abortion without complication: Secondary | ICD-10-CM

## 2020-03-19 LAB — BETA HCG QUANT (REF LAB): hCG Quant: <1 m[IU]/mL

## 2020-03-19 NOTE — Telephone Encounter (Signed)
Left message for pt to give our office a call to schedule lab appt to check HCG levels.

## 2020-03-19 NOTE — Progress Notes (Signed)
   Telephone call to patient regarding lab result today. Left voice message to return nurse call. Patient clinical had a miscarriage and need SAB follow up in 2 weeks per Judeth Horn, NP. Patient she go to MAU if severe bleeding or pelvic pain/cramping.   Clovis Pu, RN

## 2020-03-19 NOTE — Progress Notes (Signed)
   Patient in clinic for stat beta hcg.  Clovis Pu, RN

## 2020-04-04 ENCOUNTER — Ambulatory Visit: Payer: Self-pay | Admitting: Certified Nurse Midwife

## 2020-04-27 ENCOUNTER — Ambulatory Visit: Payer: Self-pay | Admitting: Obstetrics and Gynecology

## 2020-07-13 IMAGING — DX DG RIBS W/ CHEST 3+V*R*
3 series · 3 of 3 positions shown · non-contrast
Comparison: None.

CLINICAL DATA: Fall several days ago with persistent chest pain,
initial encounter

EXAM:
RIGHT RIBS AND CHEST - 3+ VIEW

[chest pa]
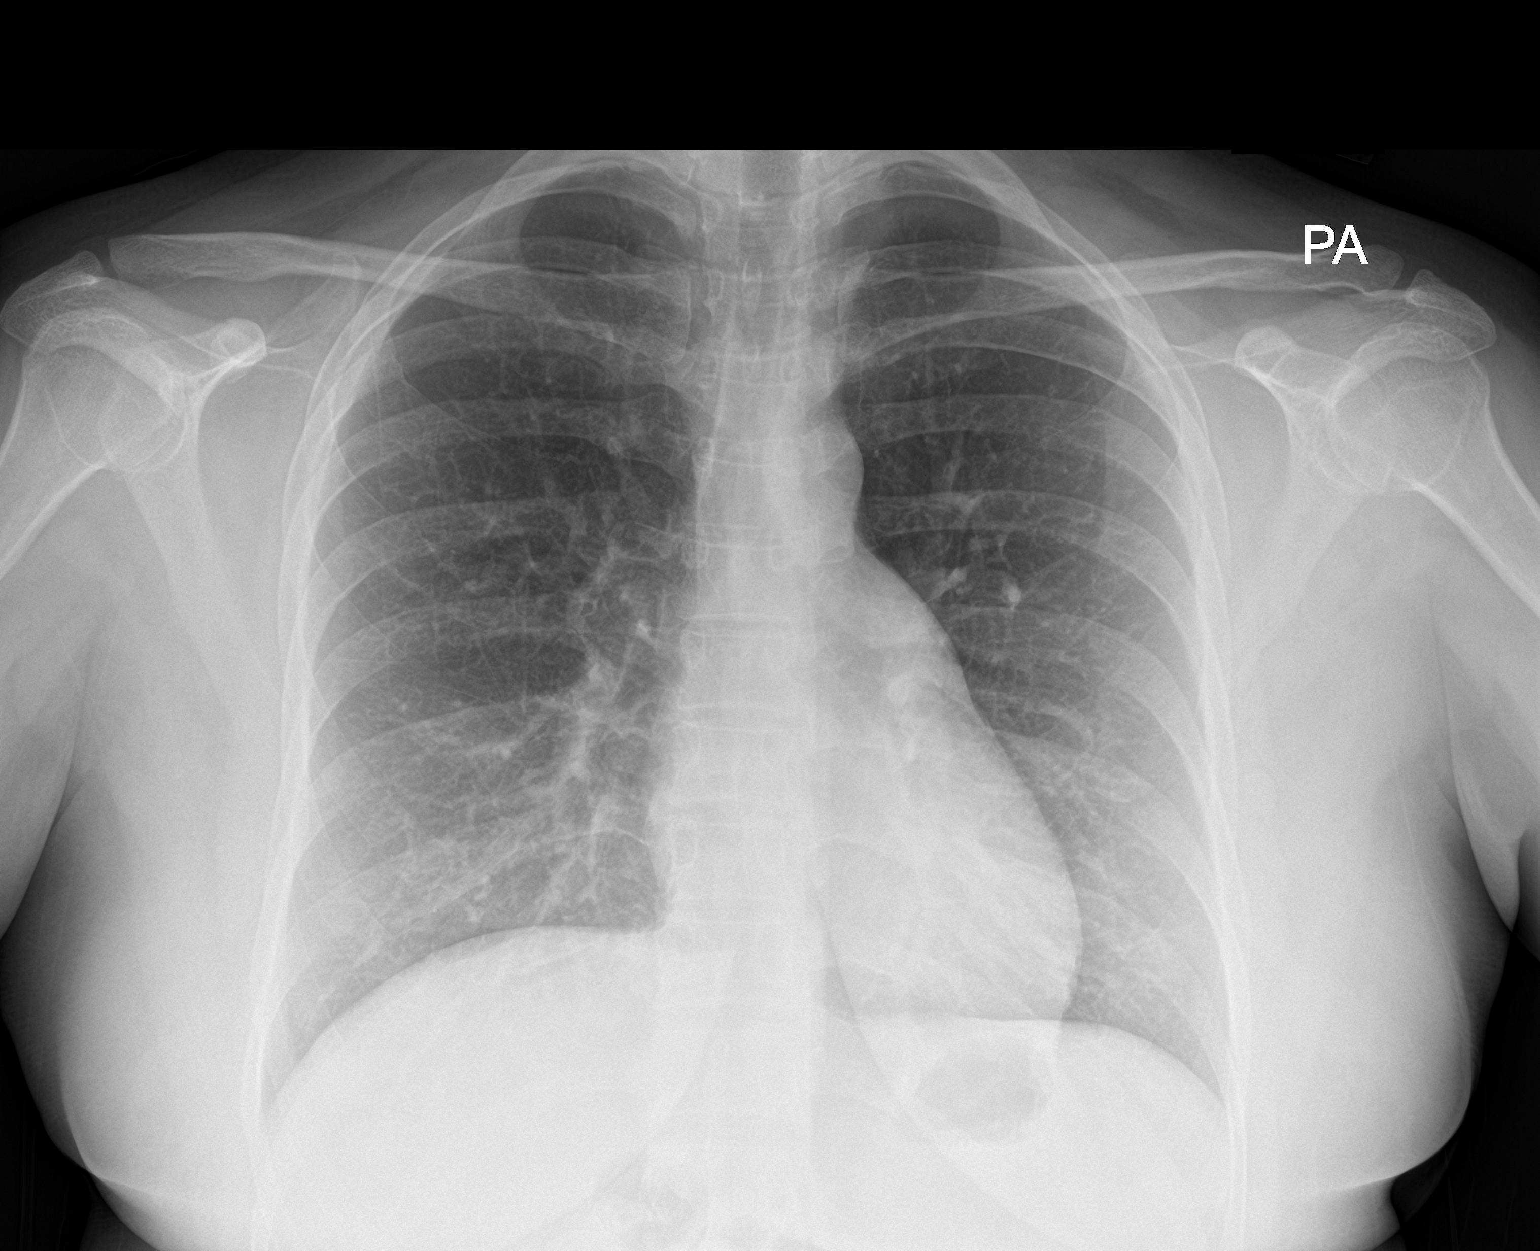

[rib pa]
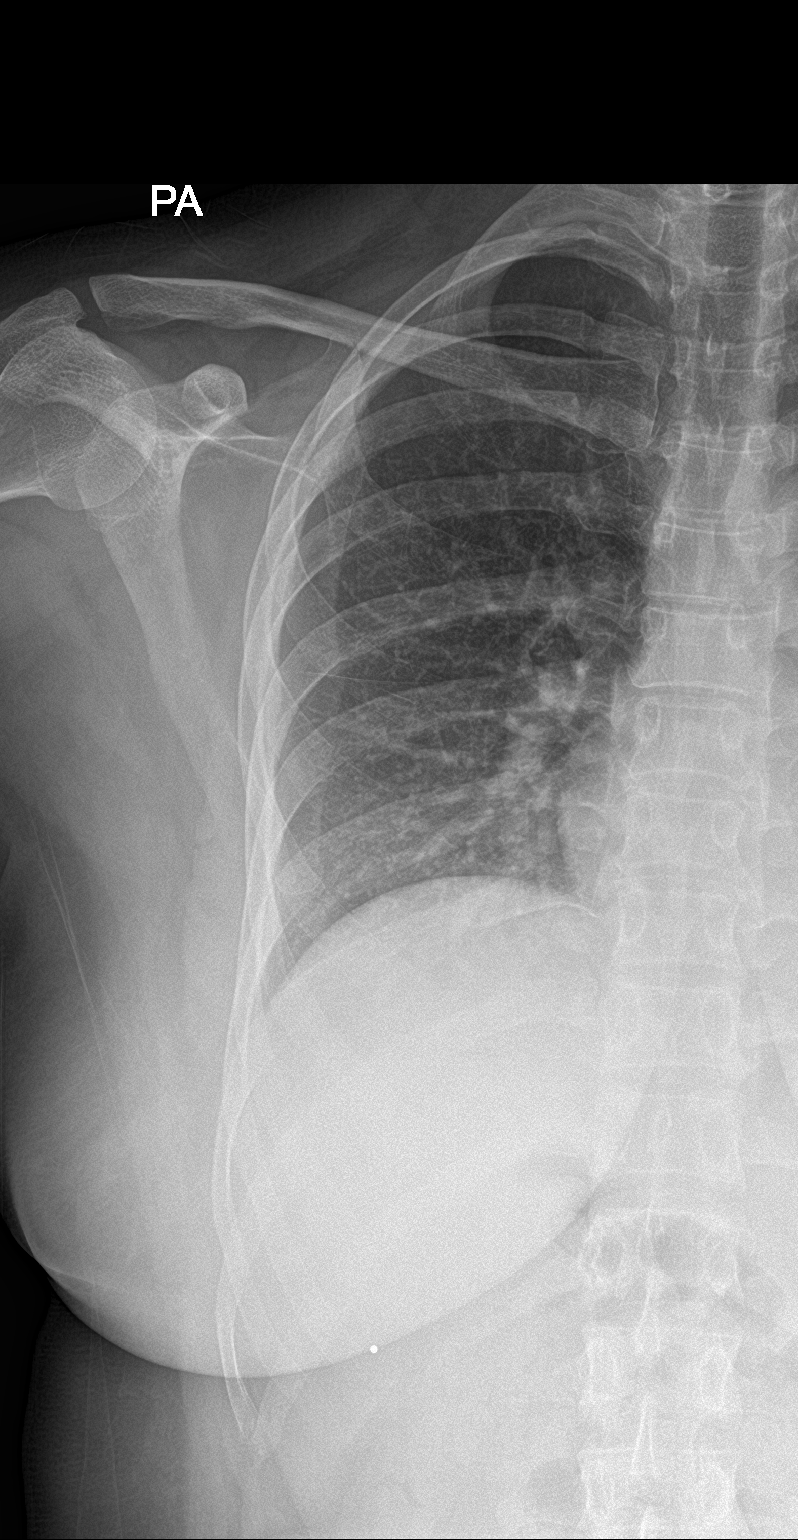

[rib obl]
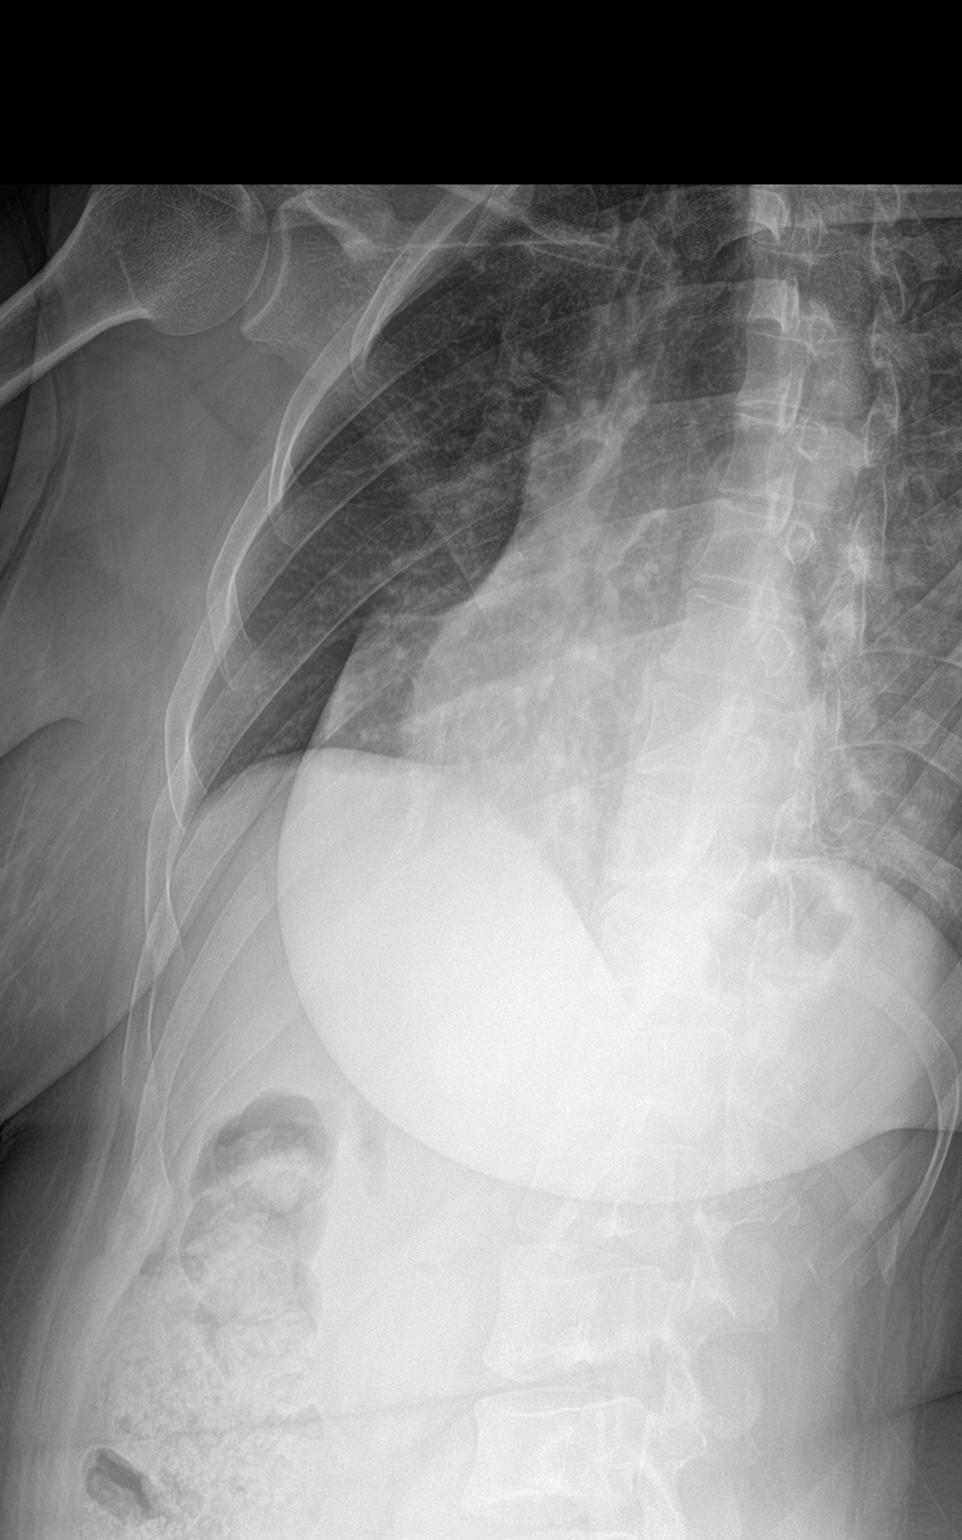

[3 of 3 positions shown; findings below may reference images not displayed]

FINDINGS: Cardiac shadow is within normal limits. The lungs are well aerated
bilaterally. No focal infiltrate or sizable effusion is seen. No
pneumothorax is identified. Minimal irregularity is noted in the
anterolateral aspect of the right eighth rib suspicious for
undisplaced fracture. No other rib abnormality is noted.
IMPRESSION: Mild cortical irregularity in the right eighth rib anterior
laterally likely related to undisplaced fracture.

## 2020-12-10 ENCOUNTER — Other Ambulatory Visit: Payer: Self-pay

## 2020-12-10 ENCOUNTER — Encounter (HOSPITAL_COMMUNITY): Payer: Self-pay | Admitting: Obstetrics & Gynecology

## 2020-12-10 ENCOUNTER — Inpatient Hospital Stay (HOSPITAL_COMMUNITY)
Admission: AD | Admit: 2020-12-10 | Discharge: 2020-12-10 | Disposition: A | Discharge status: Home / Self Care | Payer: Medicaid Other | Attending: Family Medicine | Admitting: Family Medicine

## 2020-12-10 ENCOUNTER — Inpatient Hospital Stay (HOSPITAL_COMMUNITY): Payer: Medicaid Other

## 2020-12-10 DIAGNOSIS — O4690 Antepartum hemorrhage, unspecified, unspecified trimester: Secondary | ICD-10-CM

## 2020-12-10 DIAGNOSIS — Z3A01 Less than 8 weeks gestation of pregnancy: Secondary | ICD-10-CM | POA: Insufficient documentation

## 2020-12-10 DIAGNOSIS — O3680X0 Pregnancy with inconclusive fetal viability, not applicable or unspecified: Secondary | ICD-10-CM

## 2020-12-10 DIAGNOSIS — O99331 Smoking (tobacco) complicating pregnancy, first trimester: Secondary | ICD-10-CM | POA: Diagnosis not present

## 2020-12-10 DIAGNOSIS — F17210 Nicotine dependence, cigarettes, uncomplicated: Secondary | ICD-10-CM | POA: Diagnosis not present

## 2020-12-10 DIAGNOSIS — Z880 Allergy status to penicillin: Secondary | ICD-10-CM | POA: Insufficient documentation

## 2020-12-10 DIAGNOSIS — O209 Hemorrhage in early pregnancy, unspecified: Secondary | ICD-10-CM | POA: Diagnosis not present

## 2020-12-10 DIAGNOSIS — Z6740 Type O blood, Rh positive: Secondary | ICD-10-CM

## 2020-12-10 HISTORY — DX: Chronic kidney disease, unspecified: N18.9

## 2020-12-10 LAB — COMPREHENSIVE METABOLIC PANEL
ALT: 18 U/L (ref 0–44)
AST: 17 U/L (ref 15–41)
Albumin: 3.8 g/dL (ref 3.5–5.0)
Alkaline Phosphatase: 52 U/L (ref 38–126)
Anion gap: 6 (ref 5–15)
BUN: 7 mg/dL (ref 6–20)
CO2: 28 mmol/L (ref 22–32)
Calcium: 9.3 mg/dL (ref 8.9–10.3)
Chloride: 102 mmol/L (ref 98–111)
Creatinine, Ser: 0.76 mg/dL (ref 0.44–1.00)
GFR, Estimated: >60 mL/min (ref >60)
Glucose, Bld: 99 mg/dL (ref 70–99)
Potassium: 4.5 mmol/L (ref 3.5–5.1)
Sodium: 136 mmol/L (ref 135–145)
Total Bilirubin: 0.3 mg/dL (ref 0.3–1.2)
Total Protein: 7 g/dL (ref 6.5–8.1)

## 2020-12-10 LAB — WET PREP, GENITAL
Clue Cells Wet Prep HPF POC: NONE SEEN
Sperm: NONE SEEN
Trich, Wet Prep: NONE SEEN
Yeast Wet Prep HPF POC: NONE SEEN

## 2020-12-10 LAB — CBC WITH DIFFERENTIAL/PLATELET
Abs Immature Granulocytes: 0.06 10*3/uL (ref 0.00–0.07)
Basophils Absolute: 0 10*3/uL (ref 0.0–0.1)
Basophils Relative: 0 %
Eosinophils Absolute: 0.2 10*3/uL (ref 0.0–0.5)
Eosinophils Relative: 2 %
HCT: 45.1 % (ref 36.0–46.0)
Hemoglobin: 14.8 g/dL (ref 12.0–15.0)
Immature Granulocytes: 1 %
Lymphocytes Relative: 31 %
Lymphs Abs: 3.2 10*3/uL (ref 0.7–4.0)
MCH: 29.1 pg (ref 26.0–34.0)
MCHC: 32.8 g/dL (ref 30.0–36.0)
MCV: 88.8 fL (ref 80.0–100.0)
Monocytes Absolute: 0.8 10*3/uL (ref 0.1–1.0)
Monocytes Relative: 8 %
Neutro Abs: 5.8 10*3/uL (ref 1.7–7.7)
Neutrophils Relative %: 58 %
Platelets: 302 10*3/uL (ref 150–400)
RBC: 5.08 MIL/uL (ref 3.87–5.11)
RDW: 13.6 % (ref 11.5–15.5)
WBC: 10.1 10*3/uL (ref 4.0–10.5)
nRBC: 0 % (ref 0.0–0.2)

## 2020-12-10 LAB — URINALYSIS, ROUTINE W REFLEX MICROSCOPIC
Bilirubin Urine: NEGATIVE
Glucose, UA: NEGATIVE mg/dL
Ketones, ur: NEGATIVE mg/dL
Nitrite: NEGATIVE
Protein, ur: NEGATIVE mg/dL
Specific Gravity, Urine: 1.011 (ref 1.005–1.030)
pH: 6 (ref 5.0–8.0)

## 2020-12-10 LAB — HIV ANTIBODY (ROUTINE TESTING W REFLEX): HIV Screen 4th Generation wRfx: NONREACTIVE

## 2020-12-10 LAB — ABO/RH: ABO/RH(D): O POS

## 2020-12-10 LAB — POCT PREGNANCY, URINE: Preg Test, Ur: POSITIVE — AB

## 2020-12-10 LAB — HCG, QUANTITATIVE, PREGNANCY: hCG, Beta Chain, Quant, S: 3389 m[IU]/mL — ABNORMAL HIGH (ref <5)

## 2020-12-10 NOTE — MAU Provider Note (Signed)
History     CSN: [id number]  Arrival date and time: 12/10/20 1218   Event Date/Time   First Provider Initiated Contact with Patient 12/10/20 1323      Chief Complaint  Patient presents with  . Vaginal Bleeding  . Possible Pregnancy   HPI  [name] is a 35 y.o. female G3P1001 at unknown gestation here with vaginal bleeding. The bleeding started 1.5 weeks ago. The bleeding has improved over the last few days; the bleeding has gotten lighter. The color is brown/pink. The blood is not visible every day. It waxes and wanes. She has some lower abdominal cramping that is very mild. She currently rates her pain 4/10.   OB History    Gravida  3   Para  1   Term  1   Preterm  0   AB  0   Living  1     SAB  0   IAB  0   Ectopic  0   Multiple  0   Live Births  1           Past Medical History:  Diagnosis Date  . Chronic kidney disease   . Gestational diabetes 2015   diet controlled  . Small bowel obstruction (HCC)    As infant with surgery; recent obstruction 2012    Past Surgical History:  Procedure Laterality Date  . ABDOMINAL SURGERY     Surgery as infant    Family History  Problem Relation Age of Onset  . Diabetes Father   . Alcohol abuse Father   . Alcohol abuse Mother   . Miscarriages / [place] Mother   . Cancer Maternal Aunt   . Depression Maternal Aunt   . Cancer Maternal Grandfather   . Cancer Paternal Grandfather   . Early death Paternal Grandfather     Social History   Tobacco Use  . Smoking status: Current Some Day Smoker    Packs/day: 0.50    Years: 10.00    Pack years: 5.00    Types: Cigarettes    Last attempt to quit: 08/11/2013    Years since quitting: 7.3  . Smokeless tobacco: Never Used  Substance Use Topics  . Alcohol use: Yes  . Drug use: No    Allergies:  Allergies  Allergen Reactions  . Penicillins Hives    Can take cephalosporins- no reaction    Medications Prior to Admission  Medication Sig  Dispense Refill Last Dose  . Prenatal Vit-Fe Fumarate-FA (PRENATAL MULTIVITAMIN) TABS tablet Take 1 tablet by mouth daily at 12 noon.      Results for orders placed or performed during the hospital encounter of 12/10/20 (from the past 48 hour(s))  Pregnancy, urine POC     Status: Abnormal   Collection Time: 12/10/20 12:41 PM  Result Value Ref Range   Preg Test, Ur POSITIVE (A) NEGATIVE    Comment:        THE SENSITIVITY OF THIS METHODOLOGY IS >24 mIU/mL   Urinalysis, Routine w reflex microscopic     Status: Abnormal   Collection Time: 12/10/20  1:01 PM  Result Value Ref Range   Color, Urine YELLOW YELLOW   APPearance HAZY (A) CLEAR   Specific Gravity, Urine 1.011 1.005 - 1.030   pH 6.0 5.0 - 8.0   Glucose, UA NEGATIVE NEGATIVE mg/dL   Hgb urine dipstick MODERATE (A) NEGATIVE   Bilirubin Urine NEGATIVE NEGATIVE   Ketones, ur NEGATIVE NEGATIVE mg/dL   Protein, ur NEGATIVE NEGATIVE  mg/dL   Nitrite NEGATIVE NEGATIVE   Leukocytes,Ua LARGE (A) NEGATIVE   RBC / HPF 0-5 0 - 5 RBC/hpf   WBC, UA 6-10 0 - 5 WBC/hpf   Bacteria, UA FEW (A) NONE SEEN   Squamous Epithelial / LPF 0-5 0 - 5    Comment: Performed at [facility], [address], [place], [place] [address]  Wet prep, genital     Status: Abnormal   Collection Time: 12/10/20  1:32 PM   Specimen: Cervix  Result Value Ref Range   Yeast Wet Prep HPF POC NONE SEEN NONE SEEN   Trich, Wet Prep NONE SEEN NONE SEEN   Clue Cells Wet Prep HPF POC NONE SEEN NONE SEEN   WBC, Wet Prep HPF POC MANY (A) NONE SEEN   Sperm NONE SEEN     Comment: Performed at [facility], [address], [place], [place] [address]  CBC with Differential/Platelet     Status: None   Collection Time: 12/10/20  1:35 PM  Result Value Ref Range   WBC 10.1 4.0 - 10.5 K/uL   RBC 5.08 3.87 - 5.11 MIL/uL   Hemoglobin 14.8 12.0 - 15.0 g/dL   HCT 20.9 47.0 - 96.2 %   MCV 88.8 80.0 - 100.0 fL   MCH 29.1 26.0 - 34.0 pg   MCHC 32.8 30.0 - 36.0 g/dL   RDW  83.6 62.9 - 47.6 %   Platelets 302 150 - 400 K/uL   nRBC 0.0 0.0 - 0.2 %   Neutrophils Relative % 58 %   Neutro Abs 5.8 1.7 - 7.7 K/uL   Lymphocytes Relative 31 %   Lymphs Abs 3.2 0.7 - 4.0 K/uL   Monocytes Relative 8 %   Monocytes Absolute 0.8 0.1 - 1.0 K/uL   Eosinophils Relative 2 %   Eosinophils Absolute 0.2 0.0 - 0.5 K/uL   Basophils Relative 0 %   Basophils Absolute 0.0 0.0 - 0.1 K/uL   Immature Granulocytes 1 %   Abs Immature Granulocytes 0.06 0.00 - 0.07 K/uL    Comment: Performed at [facility], [address] [place], [place] [address]  ABO/Rh     Status: None (Preliminary result)   Collection Time: 12/10/20  1:35 PM  Result Value Ref Range   ABO/RH(D) PENDING    US OB LESS THAN 14 WEEKS WITH OB TRANSVAGINAL  Result Date: 12/10/2020 CLINICAL DATA:  Vaginal bleeding, positive pregnancy test EXAM: OBSTETRIC <14 WK [place] AND TRANSVAGINAL OB US TECHNIQUE: Both transabdominal and transvaginal ultrasound examinations were performed for complete evaluation of the gestation as well as the maternal uterus, adnexal regions, and pelvic cul-de-sac. Transvaginal technique was performed to assess early pregnancy. COMPARISON:  None. FINDINGS: Intrauterine gestational sac: Present Yolk sac:  Absent Embryo:  Absent MSD: 5.7 mm   5 w   2 d Subchorionic hemorrhage:  None visualized. Maternal uterus/adnexae: Ovaries are not well seen. IMPRESSION: Probable early intrauterine gestational sac, but no yolk sac, fetal pole, or cardiac activity yet visualized. Recommend follow-up quantitative B-HCG levels and follow-up US in 14 days to assess viability. This recommendation follows SRU consensus guidelines: Diagnostic Criteria for Nonviable Pregnancy Early in the First Trimester. Malva Limes Med 2013; 354:6568-12. Electronically Signed   By: Alcide Clever M.D.   On: 12/10/2020 14:31   Review of Systems  Constitutional: Negative for fever.  Gastrointestinal: Negative for abdominal pain, nausea and vomiting.   Genitourinary: Positive for vaginal bleeding and vaginal discharge.  Physical Exam   Blood pressure 121/78, pulse 73, temperature 98.5 F (36.9 C), temperature source Oral, resp. rate 17, height 5\' 1"  (1.549 m), weight 92.4 kg, last menstrual period 09/25/2020, SpO2 99 %, unknown if currently breastfeeding.  Physical Exam Vitals and nursing note reviewed.  Constitutional:      Appearance: Normal appearance.  Genitourinary:    Comments: Vagina - Small amount of pink vaginal discharge, no odor  Cervix - No contact bleeding, no active bleeding  Bimanual exam: deferred  GC/Chlam, wet prep done Chaperone present for exam.  Skin:    General: Skin is warm.  Neurological:     Mental Status: She is alert and oriented to person, place, and time.  Psychiatric:        Behavior: Behavior normal.    MAU Course  Procedures  MDM  Wet prep & GC HIV, CBC, Hcg, ABO 09/27/2020 OB transvaginal  O positive blood type   Assessment and Plan   A:  1. Pregnancy of unknown anatomic location   2. Vaginal bleeding during pregnancy   3. Type O blood, Rh positive     P:  Discharge home in stable condition Ectopic precautions  Repeat Quant in 48 hours- Thursday 5/5 @ 1000 at [facility] Return to [facility] if symptoms worsen  Pelvic rest  Bleeding precautions  Aymee Fomby, [facility], NP 12/10/2020 8:24 PM

## 2020-12-10 NOTE — MAU Note (Signed)
Been spotting about a wk and a half.  Thought she was starting her period.  +HPT Sat and Sun.  Been having slight cramping in lower abd.  Bleeding is only when she wipes.

## 2020-12-11 LAB — CULTURE, OB URINE

## 2020-12-11 LAB — GC/CHLAMYDIA PROBE AMP (~~LOC~~) NOT AT ARMC
Chlamydia: NEGATIVE
Comment: NEGATIVE
Comment: NORMAL
Neisseria Gonorrhea: NEGATIVE

## 2020-12-13 ENCOUNTER — Ambulatory Visit (INDEPENDENT_AMBULATORY_CARE_PROVIDER_SITE_OTHER): Payer: Self-pay

## 2020-12-13 ENCOUNTER — Other Ambulatory Visit: Payer: Self-pay

## 2020-12-13 VITALS — Wt 202.3 lb

## 2020-12-13 DIAGNOSIS — O3680X0 Pregnancy with inconclusive fetal viability, not applicable or unspecified: Secondary | ICD-10-CM

## 2020-12-13 DIAGNOSIS — O4690 Antepartum hemorrhage, unspecified, unspecified trimester: Secondary | ICD-10-CM

## 2020-12-13 LAB — BETA HCG QUANT (REF LAB): hCG Quant: 3297 m[IU]/mL

## 2020-12-13 NOTE — Progress Notes (Signed)
Pt here today for follow up from MAU on 12/10/20 and  for STAT beta Hcg. Pt states having intermittent spotting with wiping only. Pt not wearing pad or pantyliner. Denies any abd pain, just having mild dull cramps. Denies heavy bleeding, clots, fever. Ectopic precautions given.   Judeth Cornfield, RN  12/13/20.

## 2020-12-13 NOTE — Progress Notes (Signed)
Agree with A & P. 

## 2020-12-13 NOTE — Progress Notes (Signed)
Results are back. Reviewed results with Dr Alysia Penna. Advised to have follow up US in 14 days.   Call placed to pt. Spoke with pt. Pt given results and recommendations per Dr Alysia Penna. Pt agreeable to plan of care.   [place] scheduled for 5/24 at 11am. Pt verbalized understanding and agreeable to date and time of appt.  Ectopic precautions reviewed.   Judeth Cornfield, RN  12/13/20.

## 2020-12-19 ENCOUNTER — Telehealth: Payer: Self-pay

## 2020-12-25 NOTE — Telephone Encounter (Signed)
Opened in Error.

## 2021-01-01 ENCOUNTER — Ambulatory Visit: Payer: Self-pay

## 2021-03-11 DIAGNOSIS — Z419 Encounter for procedure for purposes other than remedying health state, unspecified: Secondary | ICD-10-CM | POA: Diagnosis not present

## 2021-04-11 DIAGNOSIS — Z419 Encounter for procedure for purposes other than remedying health state, unspecified: Secondary | ICD-10-CM | POA: Diagnosis not present

## 2021-05-11 DIAGNOSIS — Z419 Encounter for procedure for purposes other than remedying health state, unspecified: Secondary | ICD-10-CM | POA: Diagnosis not present

## 2021-06-11 DIAGNOSIS — Z419 Encounter for procedure for purposes other than remedying health state, unspecified: Secondary | ICD-10-CM | POA: Diagnosis not present

## 2021-07-11 DIAGNOSIS — Z419 Encounter for procedure for purposes other than remedying health state, unspecified: Secondary | ICD-10-CM | POA: Diagnosis not present

## 2021-08-11 DIAGNOSIS — Z419 Encounter for procedure for purposes other than remedying health state, unspecified: Secondary | ICD-10-CM | POA: Diagnosis not present

## 2021-09-11 DIAGNOSIS — Z419 Encounter for procedure for purposes other than remedying health state, unspecified: Secondary | ICD-10-CM | POA: Diagnosis not present

## 2021-10-09 DIAGNOSIS — Z419 Encounter for procedure for purposes other than remedying health state, unspecified: Secondary | ICD-10-CM | POA: Diagnosis not present

## 2021-11-09 DIAGNOSIS — Z419 Encounter for procedure for purposes other than remedying health state, unspecified: Secondary | ICD-10-CM | POA: Diagnosis not present

## 2021-12-09 ENCOUNTER — Other Ambulatory Visit: Payer: Self-pay | Admitting: Obstetrics and Gynecology

## 2021-12-09 DIAGNOSIS — O3680X0 Pregnancy with inconclusive fetal viability, not applicable or unspecified: Secondary | ICD-10-CM

## 2021-12-09 DIAGNOSIS — Z419 Encounter for procedure for purposes other than remedying health state, unspecified: Secondary | ICD-10-CM | POA: Diagnosis not present

## 2022-01-09 DIAGNOSIS — Z419 Encounter for procedure for purposes other than remedying health state, unspecified: Secondary | ICD-10-CM | POA: Diagnosis not present

## 2022-02-07 ENCOUNTER — Encounter (HOSPITAL_COMMUNITY): Payer: Self-pay | Admitting: Emergency Medicine

## 2022-02-07 ENCOUNTER — Ambulatory Visit (HOSPITAL_COMMUNITY)
Admission: EM | Admit: 2022-02-07 | Discharge: 2022-02-07 | Disposition: A | Discharge status: Home / Self Care | Payer: Medicaid Other | Attending: Physician Assistant | Admitting: Physician Assistant

## 2022-02-07 DIAGNOSIS — H02846 Edema of left eye, unspecified eyelid: Secondary | ICD-10-CM | POA: Diagnosis not present

## 2022-02-07 DIAGNOSIS — H1032 Unspecified acute conjunctivitis, left eye: Secondary | ICD-10-CM

## 2022-02-07 MED ORDER — ERYTHROMYCIN 5 MG/GM OP OINT: TOPICAL_OINTMENT | OPHTHALMIC | 0 refills | Status: DC

## 2022-02-07 NOTE — ED Provider Notes (Signed)
[name]    CSN: [id number] Arrival date & time: 02/07/22  1147      History   Chief Complaint Chief Complaint  Patient presents with   Conjunctivitis    HPI Suzanne Lane is a 36 y.o. female.   Patient presents today with a 1 day history of left eye irritation, drainage, left eyelid swelling.  She denies any injury, foreign body sensation, exposure to fine particulate matter or chemicals.  She has tried eyedrops prior to symptom onset but these have not provided any relief.  Denies any known sick contacts.  Denies any recent illness or additional symptoms including cough, congestion, fever, nausea, vomiting.  She does not wear glasses or contacts.  Denies any photophobia, visual disturbance, diplopia, headache, dizziness, nausea, vomiting.    Past Medical History:  Diagnosis Date   Chronic kidney disease    Gestational diabetes 2015   diet controlled   Small bowel obstruction ([place])    As infant with surgery; recent obstruction 2012    Patient Active Problem List   Diagnosis Date Noted   NSVD (normal spontaneous vaginal delivery) 03/30/2014   Normal labor [id number]   Complicated UTI (urinary tract infection) 02/21/2014   Pyelonephritis affecting pregnancy 02/21/2014   Pyelonephritis affecting pregnancy in third trimester, antepartum 02/20/2014   Gestational diabetes 02/02/2014   First trimester screening 10/19/2013   Supervision of normal pregnancy 09/28/2013   UTI (urinary tract infection) during pregnancy 09/28/2013   Small bowel obstruction ([facility])     Past Surgical History:  Procedure Laterality Date   ABDOMINAL SURGERY     Surgery as infant    OB History     Gravida  3   Para  1   Term  1   Preterm  0   AB  0   Living  1      SAB  0   IAB  0   Ectopic  0   Multiple  0   Live Births  1            Home Medications    Prior to Admission medications   Medication Sig Start Date End Date Taking? Authorizing Provider   erythromycin ophthalmic ointment Place a 1/2 inch ribbon of ointment into the lower eyelid of left eye twice daily for 7 days 02/07/22  Yes Gwendalyn Mcgonagle, Derry Skill, PA-C    Family History Family History  Problem Relation Age of Onset   Diabetes Father    Alcohol abuse Father    Alcohol abuse Mother    Miscarriages / [place] Mother    Cancer Maternal Aunt    Depression Maternal Aunt    Cancer Maternal Grandfather    Cancer Paternal Grandfather    Early death Paternal Grandfather     Social History Social History   Tobacco Use   Smoking status: Some Days    Packs/day: 0.50    Years: 10.00    Total pack years: 5.00    Types: Cigarettes    Last attempt to quit: 08/11/2013    Years since quitting: 8.4   Smokeless tobacco: Never  Substance Use Topics   Alcohol use: Yes   Drug use: No     Allergies   Penicillins   Review of Systems Review of Systems  Constitutional:  Negative for activity change, appetite change, fatigue and fever.  HENT:  Negative for congestion, sinus pressure, sneezing and sore throat.   Eyes:  Positive for discharge, redness and itching. Negative for photophobia, pain  and visual disturbance.  Respiratory:  Negative for cough and shortness of breath.   Cardiovascular:  Negative for chest pain.  Gastrointestinal:  Negative for abdominal pain, diarrhea, nausea and vomiting.  Neurological:  Negative for dizziness, light-headedness and headaches.     Physical Exam Triage Vital Signs ED Triage Vitals  Enc Vitals Group     BP 02/07/22 1252 118/88     Pulse Rate 02/07/22 1252 68     Resp 02/07/22 1252 17     Temp 02/07/22 1252 98.1 F (36.7 C)     Temp Source 02/07/22 1252 Oral     SpO2 02/07/22 1252 99 %     Weight --      Height --      Head Circumference --      Peak Flow --      Pain Score 02/07/22 1251 3     Pain Loc --      Pain Edu? --      Excl. in GC? --    No data found.  Updated Vital Signs BP 118/88 (BP Location: Left Arm)   Pulse  68   Temp 98.1 F (36.7 C) (Oral)   Resp 17   LMP 01/08/2022   SpO2 99%   Visual Acuity Right Eye Distance: 20/40 Left Eye Distance: 20/25 Bilateral Distance: 20/20  Right Eye Near:   Left Eye Near:    Bilateral Near:     Physical Exam Vitals reviewed.  Constitutional:      General: She is awake. She is not in acute distress.    Appearance: Normal appearance. She is well-developed. She is not ill-appearing.     Comments: Very pleasant female appears stated age in no acute distress sitting comfortably in exam room  HENT:     Head: Normocephalic and atraumatic.     Right Ear: External ear normal.     Left Ear: External ear normal.  Eyes:     General:        Right eye: No hordeolum.        Left eye: No hordeolum.     Extraocular Movements: Extraocular movements intact.     Conjunctiva/sclera:     Left eye: Left conjunctiva is injected.     Pupils: Pupils are equal, round, and reactive to light.     Comments: Mild swelling noted left upper eyelid.  No specific hordeolum   Cardiovascular:     Rate and Rhythm: Normal rate and regular rhythm.     Heart sounds: Normal heart sounds, S1 normal and S2 normal. No murmur heard. Pulmonary:     Effort: Pulmonary effort is normal.     Breath sounds: Normal breath sounds. No wheezing, rhonchi or rales.     Comments: Clear to auscultation bilaterally Psychiatric:        Behavior: Behavior is cooperative.      UC Treatments / Results  Labs (all labs ordered are listed, but only abnormal results are displayed) Labs Reviewed - No data to display  EKG   Radiology No results found.  Procedures Procedures (including critical care time)  Medications Ordered in UC Medications - No data to display  Initial Impression / Assessment and Plan / UC Course  I have reviewed the triage vital signs and the nursing notes.  Pertinent labs & imaging results that were available during my care of the patient were reviewed by me and  considered in my medical decision making (see chart for details).  Fluorescein staining was deferred as patient denied any foreign body sensation denies any recent ocular trauma.  Concern for conjunctivitis as etiology of symptoms particularly given overnight drainage.  Patient was started on erythromycin ointment with instruction to use this twice daily.  She is to avoid touching tip of medication bottle to her eye and make sure to wash hands prior to handling medication to prevent contamination.  Can use lubricating eyedrops/artificial tears for additional symptom relief.  If her symptoms or not improving she is to follow-up with ophthalmology and was given contact information for local provider with instruction to call to schedule an appointment.  Discussed that if she has any worsening symptoms including visual disturbance, ocular pain, diplopia, photophobia, nausea/vomiting she needs to go to the emergency room to which she expressed understanding.  Strict return precautions given.  Final Clinical Impressions(s) / UC Diagnoses   Final diagnoses:  Acute bacterial conjunctivitis of left eye  Swelling of left eyelid     Discharge Instructions      Use erythromycin ointment twice daily.  Do not touch tip of medication bottle to your eye and make sure to wash hands prior to handling medication to prevent contamination of medicine.  If you have any visual change or continue to have symptoms I recommend you follow-up with ophthalmology; call to schedule an appointment.  If anything worsens and you have visual change, eye pain, fever, nausea/vomiting, double vision you need to go to the emergency room immediately.     ED Prescriptions     Medication Sig Dispense Auth. Provider   erythromycin ophthalmic ointment Place a 1/2 inch ribbon of ointment into the lower eyelid of left eye twice daily for 7 days 3.5 g Claudean Leavelle K, PA-C      PDMP not reviewed this encounter.   Jeani Hawking,  PA-C 02/07/22 1418

## 2022-02-07 NOTE — ED Triage Notes (Signed)
Pt reports left eye lid swelling that started this morning. Reports eye was crusted this morning with itching and watering today.

## 2022-02-07 NOTE — Discharge Instructions (Addendum)
Use erythromycin ointment twice daily.  Do not touch tip of medication bottle to your eye and make sure to wash hands prior to handling medication to prevent contamination of medicine.  If you have any visual change or continue to have symptoms I recommend you follow-up with ophthalmology; call to schedule an appointment.  If anything worsens and you have visual change, eye pain, fever, nausea/vomiting, double vision you need to go to the emergency room immediately.

## 2022-02-08 DIAGNOSIS — Z419 Encounter for procedure for purposes other than remedying health state, unspecified: Secondary | ICD-10-CM | POA: Diagnosis not present

## 2022-02-27 ENCOUNTER — Encounter: Payer: Self-pay | Admitting: Emergency Medicine

## 2022-02-27 ENCOUNTER — Ambulatory Visit
Admission: EM | Admit: 2022-02-27 | Discharge: 2022-02-27 | Disposition: A | Discharge status: Home / Self Care | Payer: Medicaid Other | Attending: Emergency Medicine | Admitting: Emergency Medicine

## 2022-02-27 DIAGNOSIS — K047 Periapical abscess without sinus: Secondary | ICD-10-CM | POA: Diagnosis not present

## 2022-02-27 MED ORDER — CLINDAMYCIN HCL 300 MG PO CAPS: 300.0000 mg | ORAL_CAPSULE | Freq: Four times a day (QID) | ORAL | 0 refills | Status: AC

## 2022-02-27 NOTE — ED Provider Notes (Signed)
[facility]    CSN: [id number] Arrival date & time: 02/27/22  1135    HISTORY   Chief Complaint  Patient presents with   Dental Pain   Abscess   HPI Suzanne Lane is a pleasant, 36 y.o. female who presents to urgent care today. Pt here with dental pain and possible abscess in upper right side of mouth x 2 days.  Patient states she has had warmth and facial swelling on the right side for the past 24 hours which is tender to palpation.  Patient denies fever or purulent drainage from abscess.  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  States that the tooth that she believes to be infected has been deteriorating for a few months.  The history is provided by the patient.   Past Medical History:  Diagnosis Date   Chronic kidney disease    Gestational diabetes 2015   diet controlled   Small bowel obstruction ([facility])    As infant with surgery; recent obstruction 2012   Patient Active Problem List   Diagnosis Date Noted   NSVD (normal spontaneous vaginal delivery) 03/30/2014   Normal labor 03/28/2014   Complicated UTI (urinary tract infection) 02/21/2014   Pyelonephritis affecting pregnancy 02/21/2014   Pyelonephritis affecting pregnancy in third trimester, antepartum 02/20/2014   Gestational diabetes 02/02/2014   First trimester screening 10/19/2013   Supervision of normal pregnancy 09/28/2013   UTI (urinary tract infection) during pregnancy 09/28/2013   Small bowel obstruction ([facility])    Past Surgical History:  Procedure Laterality Date   ABDOMINAL SURGERY     Surgery as infant   OB History     Gravida  3   Para  1   Term  1   Preterm  0   AB  0   Living  1      SAB  0   IAB  0   Ectopic  0   Multiple  0   Live Births  1          Home Medications    Prior to Admission medications   Medication Sig Start Date End Date Taking? Authorizing Provider  clindamycin (CLEOCIN) 300 MG capsule Take 1 capsule (300 mg  total) by mouth 4 (four) times daily for 14 days. 02/27/22 03/13/22 Yes Theadora Rama Scales, PA-C  erythromycin ophthalmic ointment Place a 1/2 inch ribbon of ointment into the lower eyelid of left eye twice daily for 7 days 02/07/22   Raspet, Noberto Retort, PA-C    Family History Family History  Problem Relation Age of Onset   Diabetes Father    Alcohol abuse Father    Alcohol abuse Mother    Miscarriages / [place] Mother    Cancer Maternal Aunt    Depression Maternal Aunt    Cancer Maternal Grandfather    Cancer Paternal Grandfather    Early death Paternal Grandfather    Social History Social History   Tobacco Use   Smoking status: Some Days    Packs/day: 0.50    Years: 10.00    Total pack years: 5.00    Types: Cigarettes    Last attempt to quit: 08/11/2013    Years since quitting: 8.5   Smokeless tobacco: Never  Substance Use Topics   Alcohol use: Yes   Drug use: No   Allergies   Penicillins  Review of Systems Review of Systems Pertinent findings revealed after performing a 14 point review of systems has been  noted in the history of present illness.  Physical Exam Triage Vital Signs ED Triage Vitals  Enc Vitals Group     BP 06/07/21 0827 (!) 147/82     Pulse Rate 06/07/21 0827 72     Resp 06/07/21 0827 18     Temp 06/07/21 0827 98.3 F (36.8 C)     Temp Source 06/07/21 0827 Oral     SpO2 06/07/21 0827 98 %     Weight --      Height --      Head Circumference --      Peak Flow --      Pain Score 06/07/21 0826 5     Pain Loc --      Pain Edu? --      Excl. in GC? --   No data found.  Updated Vital Signs BP 113/76   Pulse 65   Temp 98.1 F (36.7 C)   Resp 20   LMP 02/07/2022 (Approximate)   SpO2 98%   Physical Exam Vitals and nursing note reviewed.  Constitutional:      General: She is not in acute distress.    Appearance: Normal appearance. She is not ill-appearing.  HENT:     Head: Atraumatic.     Salivary Glands: Right salivary gland is not  diffusely enlarged or tender. Left salivary gland is not diffusely enlarged or tender.     Comments: Significant swelling of right side of face from temple to mandible with mild induration, warmth and erythema    Right Ear: Tympanic membrane, ear canal and external ear normal. No drainage. No middle ear effusion. There is no impacted cerumen. Tympanic membrane is not erythematous or bulging.     Left Ear: Tympanic membrane, ear canal and external ear normal. No drainage.  No middle ear effusion. There is no impacted cerumen. Tympanic membrane is not erythematous or bulging.     Nose: Nose normal. No nasal deformity, septal deviation, mucosal edema, congestion or rhinorrhea.     Right Turbinates: Not enlarged, swollen or pale.     Left Turbinates: Not enlarged, swollen or pale.     Right Sinus: No maxillary sinus tenderness or frontal sinus tenderness.     Left Sinus: No maxillary sinus tenderness or frontal sinus tenderness.     Mouth/Throat:     Lips: Pink. No lesions.     Mouth: Mucous membranes are moist. No oral lesions.     Pharynx: Oropharynx is clear. Uvula midline. No posterior oropharyngeal erythema or uvula swelling.     Tonsils: No tonsillar exudate. 0 on the right. 0 on the left.   Eyes:     General: Lids are normal.        Right eye: No discharge.        Left eye: No discharge.     Extraocular Movements: Extraocular movements intact.     Conjunctiva/sclera: Conjunctivae normal.     Right eye: Right conjunctiva is not injected.     Left eye: Left conjunctiva is not injected.  Neck:     Trachea: Trachea and phonation normal.  Cardiovascular:     Rate and Rhythm: Normal rate and regular rhythm.     Pulses: Normal pulses.     Heart sounds: Normal heart sounds. No murmur heard.    No friction rub. No gallop.  Pulmonary:     Effort: Pulmonary effort is normal. No accessory muscle usage, prolonged expiration or respiratory distress.     Breath sounds: Normal  breath sounds. No  stridor, decreased air movement or transmitted upper airway sounds. No decreased breath sounds, wheezing, rhonchi or rales.  Chest:     Chest wall: No tenderness.  Musculoskeletal:        General: Normal range of motion.     Cervical back: Normal range of motion and neck supple. Normal range of motion.  Lymphadenopathy:     Cervical: Cervical adenopathy present.     Right cervical: Superficial cervical adenopathy and posterior cervical adenopathy present.     Left cervical: Superficial cervical adenopathy and posterior cervical adenopathy present.  Skin:    General: Skin is warm and dry.     Findings: No erythema or rash.  Neurological:     General: No focal deficit present.     Mental Status: She is alert and oriented to person, place, and time.  Psychiatric:        Mood and Affect: Mood normal.        Behavior: Behavior normal.     Visual Acuity Right Eye Distance:   Left Eye Distance:   Bilateral Distance:    Right Eye Near:   Left Eye Near:    Bilateral Near:     UC Couse / Diagnostics / Procedures:     Radiology No results found.  Procedures Procedures (including critical care time) EKG  Pending results:  Labs Reviewed - No data to display  Medications Ordered in UC: Medications - No data to display  UC Diagnoses / Final Clinical Impressions(s)   I have reviewed the triage vital signs and the nursing notes.  Pertinent labs & imaging results that were available during my care of the patient were reviewed by me and considered in my medical decision making (see chart for details).    Final diagnoses:  Dental abscess   Patient exhibiting signs of acute infection of soft tissue surrounding infected tooth.  Patient reports rash allergy to penicillin.  Patient provided with prescription for clindamycin for 14 days and advised to follow-up with dentist as soon as possible.  Emergency precautions advised.  ED Prescriptions     Medication Sig Dispense Auth.  Provider   clindamycin (CLEOCIN) 300 MG capsule Take 1 capsule (300 mg total) by mouth 4 (four) times daily for 14 days. 56 capsule Theadora Rama Scales, PA-C      PDMP not reviewed this encounter.  Pending results:  Labs Reviewed - No data to display  Discharge Instructions:   Discharge Instructions      Please reach out to a dentist to get an appointment scheduled within the next 2 weeks.  While you are waiting for your appointment, please begin clindamycin 4 times daily for full 14-day course to eradicate the infection that is in the soft tissue around your infected tooth.  Thank you for visiting urgent care today.    Disposition Upon Discharge:  Condition: stable for discharge home  Patient presented with an acute illness with associated systemic symptoms and significant discomfort requiring urgent management. In my opinion, this is a condition that a prudent lay person (someone who possesses an average knowledge of health and medicine) may potentially expect to result in complications if not addressed urgently such as respiratory distress, impairment of bodily function or dysfunction of bodily organs.   Routine symptom specific, illness specific and/or disease specific instructions were discussed with the patient and/or caregiver at length.   As such, the patient has been evaluated and assessed, work-up was performed and treatment was provided in  alignment with urgent care protocols and evidence based medicine.  Patient/parent/caregiver has been advised that the patient may require follow up for further testing and treatment if the symptoms continue in spite of treatment, as clinically indicated and appropriate.  Patient/parent/caregiver has been advised to return to the [facility] or PCP if no better; to PCP or the Emergency Department if new signs and symptoms develop, or if the current signs or symptoms continue to change or worsen for further workup, evaluation and treatment as  clinically indicated and appropriate  The patient will follow up with their current PCP if and as advised. If the patient does not currently have a PCP we will assist them in obtaining one.   The patient may need specialty follow up if the symptoms continue, in spite of conservative treatment and management, for further workup, evaluation, consultation and treatment as clinically indicated and appropriate.   Patient/parent/caregiver verbalized understanding and agreement of plan as discussed.  All questions were addressed during visit.  Please see discharge instructions below for further details of plan.  This office note has been dictated using Teaching laboratory technician.  Unfortunately, this method of dictation can sometimes lead to typographical or grammatical errors.  I apologize for your inconvenience in advance if this occurs.  Please do not hesitate to reach out to me if clarification is needed.      Theadora Rama Scales, PA-C 02/27/22 1626

## 2022-02-27 NOTE — ED Triage Notes (Signed)
Pt here with dental pain and possible abscess in upper right side of mouth x 2 days.

## 2022-02-27 NOTE — Discharge Instructions (Signed)
Please reach out to a dentist to get an appointment scheduled within the next 2 weeks.  While you are waiting for your appointment, please begin clindamycin 4 times daily for full 14-day course to eradicate the infection that is in the soft tissue around your infected tooth.  Thank you for visiting urgent care today.

## 2022-03-11 DIAGNOSIS — Z419 Encounter for procedure for purposes other than remedying health state, unspecified: Secondary | ICD-10-CM | POA: Diagnosis not present

## 2022-04-11 DIAGNOSIS — Z419 Encounter for procedure for purposes other than remedying health state, unspecified: Secondary | ICD-10-CM | POA: Diagnosis not present

## 2022-05-11 DIAGNOSIS — Z419 Encounter for procedure for purposes other than remedying health state, unspecified: Secondary | ICD-10-CM | POA: Diagnosis not present

## 2022-06-11 DIAGNOSIS — Z419 Encounter for procedure for purposes other than remedying health state, unspecified: Secondary | ICD-10-CM | POA: Diagnosis not present

## 2022-07-11 DIAGNOSIS — Z419 Encounter for procedure for purposes other than remedying health state, unspecified: Secondary | ICD-10-CM | POA: Diagnosis not present

## 2022-08-11 DIAGNOSIS — Z419 Encounter for procedure for purposes other than remedying health state, unspecified: Secondary | ICD-10-CM | POA: Diagnosis not present

## 2022-09-11 DIAGNOSIS — Z419 Encounter for procedure for purposes other than remedying health state, unspecified: Secondary | ICD-10-CM | POA: Diagnosis not present

## 2022-10-10 DIAGNOSIS — Z419 Encounter for procedure for purposes other than remedying health state, unspecified: Secondary | ICD-10-CM | POA: Diagnosis not present

## 2022-11-10 DIAGNOSIS — Z419 Encounter for procedure for purposes other than remedying health state, unspecified: Secondary | ICD-10-CM | POA: Diagnosis not present

## 2022-11-27 ENCOUNTER — Encounter: Payer: Self-pay | Admitting: Nurse Practitioner

## 2022-11-27 ENCOUNTER — Ambulatory Visit: Payer: Medicaid Other | Admitting: Nurse Practitioner

## 2022-11-27 VITALS — BP 128/80 | HR 58 | Temp 98.1°F | Ht 61.0 in | Wt 198.8 lb

## 2022-11-27 DIAGNOSIS — R140 Abdominal distension (gaseous): Secondary | ICD-10-CM

## 2022-11-27 DIAGNOSIS — Z800 Family history of malignant neoplasm of digestive organs: Secondary | ICD-10-CM | POA: Diagnosis not present

## 2022-11-27 DIAGNOSIS — Z8632 Personal history of gestational diabetes: Secondary | ICD-10-CM

## 2022-11-27 DIAGNOSIS — Z1159 Encounter for screening for other viral diseases: Secondary | ICD-10-CM | POA: Diagnosis not present

## 2022-11-27 DIAGNOSIS — Z01419 Encounter for gynecological examination (general) (routine) without abnormal findings: Secondary | ICD-10-CM | POA: Diagnosis not present

## 2022-11-27 DIAGNOSIS — Z8719 Personal history of other diseases of the digestive system: Secondary | ICD-10-CM

## 2022-11-27 DIAGNOSIS — Z1231 Encounter for screening mammogram for malignant neoplasm of breast: Secondary | ICD-10-CM

## 2022-11-27 NOTE — Progress Notes (Signed)
I,Sheena H Holbrook,acting as a Neurosurgeon for Arnette Felts, FNP.,have documented all relevant documentation on the behalf of Arnette Felts, FNP,as directed by  Arnette Felts, FNP while in the presence of Arnette Felts, FNP.   Subjective:     Patient ID: Suzanne Lane , female    DOB: 1986-02-26 , 37 y.o.   MRN: [id number]   Chief Complaint  Patient presents with   Establish Care    HPI  Patient presents today to establish care. She has not had a PCP for approximately 9 years. She is working as a Teacher, English as a foreign language. Single. One daughter - 20 y/o. She has had 2 miscarriages a few years ago. She does not have a GYN.   Patient had small intestines removed, that's the only issues she has with her stomach. She is trying to make sure that does not happen again.   Paternal grandmother had colon cancer. Maternal aunt had breast cancer with mastectomy.   She will smoke cigarettes when she drinks wine or beer.  She is having pain to her left abdomen. She has bloating and has not taken a probiotic. She does have a history of constipation. Denies nausea or vomiting      Past Medical History:  Diagnosis Date   Chronic kidney disease    Gestational diabetes 2015   diet controlled   Small bowel obstruction (HCC)    As infant with surgery; recent obstruction 2012     Family History  Problem Relation Age of Onset   Diabetes Father    Alcohol abuse Father    Alcohol abuse Mother    Miscarriages / [place] Mother    Cancer Maternal Aunt    Depression Maternal Aunt    Cancer Maternal Grandfather    Cancer Paternal Grandfather    Early death Paternal Grandfather      Current Outpatient Medications:    erythromycin ophthalmic ointment, Place a 1/2 inch ribbon of ointment into the lower eyelid of left eye twice daily for 7 days (Patient not taking: Reported on 11/27/2022), Disp: 3.5 g, Rfl: 0   Allergies  Allergen Reactions   Penicillins Hives    Can take cephalosporins- no reaction      Review of Systems  Constitutional: Negative.   Respiratory: Negative.    Cardiovascular: Negative.   Gastrointestinal:  Positive for abdominal pain and constipation.  Psychiatric/Behavioral: Negative.       Today's Vitals   11/27/22 1508  BP: 128/80  Pulse: (!) 58  Temp: 98.1 F (36.7 C)  TempSrc: Oral  SpO2: 97%  Weight: 198 lb 12.8 oz (90.2 kg)  Height: 5\' 1"  (1.549 m)   Body mass index is 37.56 kg/m.   Objective:  Physical Exam Vitals reviewed.  Constitutional:      Appearance: She is well-developed.  HENT:     Head: Normocephalic and atraumatic.  Eyes:     Pupils: Pupils are equal, round, and reactive to light.  Cardiovascular:     Rate and Rhythm: Normal rate and regular rhythm.     Pulses: Normal pulses.     Heart sounds: Normal heart sounds. No murmur heard. Pulmonary:     Effort: Pulmonary effort is normal.     Breath sounds: Normal breath sounds.  Musculoskeletal:        General: Normal range of motion.  Skin:    General: Skin is warm and dry.     Capillary Refill: Capillary refill takes less than 2 seconds.  Neurological:  General: No focal deficit present.     Mental Status: She is alert and oriented to person, place, and time.     Cranial Nerves: No cranial nerve deficit.  Psychiatric:        Mood and Affect: Mood normal.         Assessment And Plan:     1. Abdominal bloating Comments: Sample of [place] given in office encouraged to avoid foods that can increase the risk for bloating. Will refer to GI due to long history of GI issues and FMH - CBC  2. History of gestational diabetes - Hemoglobin A1c - CMP14+EGFR  3. History of small bowel obstruction  4. Encounter for hepatitis C screening test for low risk patient Will check Hepatitis C screening due to recent recommendations to screen all adults 18 years and older - Hepatitis C antibody  5. Encounter for gynecological examination - Ambulatory referral to Obstetrics /  Gynecology  6. Family history of colon cancer - Ambulatory referral to Gastroenterology  7. Encounter for screening mammogram for breast cancer Pt instructed on Self Breast Exam.According to ACOG guidelines Women aged 9 and older are recommended to get an annual mammogram. Form completed and given to patient contact the [facility] for appointment scheduing.  Pt encouraged to get annual mammogram - MM 3D SCREENING MAMMOGRAM BILATERAL BREAST; Future   Patient was given opportunity to ask questions. Patient verbalized understanding of the plan and was able to repeat key elements of the plan. All questions were answered to their satisfaction.   Arnette Felts, FNP   I, Arnette Felts, FNP, have reviewed all documentation for this visit. The documentation on 04//24 for the exam, diagnosis, procedures, and orders are all accurate and complete. 18  THE PATIENT IS ENCOURAGED TO PRACTICE SOCIAL DISTANCING DUE TO THE COVID-19 PANDEMIC.

## 2022-11-28 LAB — CMP14+EGFR
ALT: 17 IU/L (ref 0–32)
AST: 17 IU/L (ref 0–40)
Albumin/Globulin Ratio: 1.4 (ref 1.2–2.2)
Albumin: 4.4 g/dL (ref 3.9–4.9)
Alkaline Phosphatase: 75 IU/L (ref 44–121)
BUN/Creatinine Ratio: 25 — ABNORMAL HIGH (ref 9–23)
BUN: 18 mg/dL (ref 6–20)
Bilirubin Total: 0.4 mg/dL (ref 0.0–1.2)
CO2: 20 mmol/L (ref 20–29)
Calcium: 9.6 mg/dL (ref 8.7–10.2)
Chloride: 100 mmol/L (ref 96–106)
Creatinine, Ser: 0.72 mg/dL (ref 0.57–1.00)
Globulin, Total: 3.2 g/dL (ref 1.5–4.5)
Glucose: 80 mg/dL (ref 70–99)
Potassium: 4.7 mmol/L (ref 3.5–5.2)
Sodium: 138 mmol/L (ref 134–144)
Total Protein: 7.6 g/dL (ref 6.0–8.5)
eGFR: 110 mL/min/{1.73_m2} (ref >59)

## 2022-11-28 LAB — CBC
Hematocrit: 45 % (ref 34.0–46.6)
Hemoglobin: 15 g/dL (ref 11.1–15.9)
MCH: 28.3 pg (ref 26.6–33.0)
MCHC: 33.3 g/dL (ref 31.5–35.7)
MCV: 85 fL (ref 79–97)
Platelets: 298 10*3/uL (ref 150–450)
RBC: 5.3 x10E6/uL — ABNORMAL HIGH (ref 3.77–5.28)
RDW: 13.1 % (ref 11.7–15.4)
WBC: 9.1 10*3/uL (ref 3.4–10.8)

## 2022-11-28 LAB — HEMOGLOBIN A1C
Est. average glucose Bld gHb Est-mCnc: 120 mg/dL
Hgb A1c MFr Bld: 5.8 % — ABNORMAL HIGH (ref 4.8–5.6)

## 2022-11-28 LAB — HEPATITIS C ANTIBODY: Hep C Virus Ab: NONREACTIVE

## 2022-12-10 DIAGNOSIS — Z419 Encounter for procedure for purposes other than remedying health state, unspecified: Secondary | ICD-10-CM | POA: Diagnosis not present

## 2023-01-10 DIAGNOSIS — Z419 Encounter for procedure for purposes other than remedying health state, unspecified: Secondary | ICD-10-CM | POA: Diagnosis not present

## 2023-01-21 ENCOUNTER — Ambulatory Visit (INDEPENDENT_AMBULATORY_CARE_PROVIDER_SITE_OTHER): Payer: Medicaid Other | Admitting: Gastroenterology

## 2023-01-21 ENCOUNTER — Encounter: Payer: Self-pay | Admitting: Gastroenterology

## 2023-01-21 VITALS — BP 118/70 | HR 66 | Ht 61.0 in | Wt 194.5 lb

## 2023-01-21 DIAGNOSIS — K581 Irritable bowel syndrome with constipation: Secondary | ICD-10-CM | POA: Diagnosis not present

## 2023-01-21 DIAGNOSIS — Z800 Family history of malignant neoplasm of digestive organs: Secondary | ICD-10-CM

## 2023-01-21 NOTE — Patient Instructions (Addendum)
_______________________________________________________  If your blood pressure at your visit was 140/90 or greater, please contact your primary care physician to follow up on this.  _______________________________________________________  If you are age 38 or older, your body mass index should be between 23-30. Your Body mass index is 36.75 kg/m. If this is out of the aforementioned range listed, please consider follow up with your Primary Care Provider.  If you are age 64 or younger, your body mass index should be between 19-25. Your Body mass index is 36.75 kg/m. If this is out of the aformentioned range listed, please consider follow up with your Primary Care Provider.   ________________________________________________________  The [facility] providers would like to encourage you to use [facility] to communicate with providers for non-urgent requests or questions.  Due to long hold times on the telephone, sending your provider a message by [facility] may be a faster and more efficient way to get a response.  Please allow 48 business hours for a response.  Please remember that this is for non-urgent requests.  _______________________________________________________  Two days before your procedure: Mix 3 packs (or capfuls) of Miralax in 48 ounces of clear liquid and drink at 6pm.  You have been scheduled for a colonoscopy. Please follow written instructions given to you at your visit today.  Please pick up your prep supplies at the pharmacy within the next 1-3 days. If you use inhalers (even only as needed), please bring them with you on the day of your procedure.  Continue probiotics  Please drink at least 64oz of water daily  Thank you,  Dr. Lynann Bologna

## 2023-01-21 NOTE — Progress Notes (Signed)
Chief Complaint:   Referring Provider:  Arnette Felts, FNP      ASSESSMENT AND PLAN;   #1. FH CRC (2nd degree relative)  #2. IBS-C  Plan: -Colon with 2 day prep -Continue probiotics. -Drink plenty of water.   Discussed risks & benefits of colonoscopy. Risks including rare perforation req laparotomy, bleeding after bx/polypectomy req blood transfusion, rarely missing neoplasms, risks of anesthesia/sedation, rare risk of damage to internal organs. Benefits outweigh the risks. Patient agrees to proceed. All the questions were answered. Pt consents to proceed.  HPI:    Suzanne Lane is a 37 y.o. female  With abdominal surgery as an infant, PSBO d/t adhesions 2012 (managed conservatively).  Here for colon  GM (dad's mom) < 60- colon Ca. Dad had neg colon 6/11 (yesterday)  Occ heartburn- better with probiotics Constipation is better with probiotics. Dx with IBS-C in past.  Has longstanding history of constipation with pellet-like stools and abdominal bloating. No blood in stool  No nausea, vomiting, regurgitation, odynophagia or dysphagia.  No significant diarrhea.  No melena or hematochezia. No unintentional weight loss. No abdominal pain currently.  Past Medical History:  Diagnosis Date   Chronic kidney disease    Gestational diabetes 2015   diet controlled   Small bowel obstruction (HCC)    As infant with surgery; recent obstruction 2012    Past Surgical History:  Procedure Laterality Date   ABDOMINAL SURGERY     Surgery as infant    Family History  Problem Relation Age of Onset   Alcohol abuse Mother    Miscarriages / [place] Mother    Diabetes Father    Alcohol abuse Father    Cancer Maternal Grandfather    Colon cancer Maternal Grandfather    Cancer Paternal Grandfather    Early death Paternal Grandfather    Cancer Maternal Aunt    Depression Maternal Aunt    Stomach cancer Neg Hx    Rectal cancer Neg Hx    Esophageal cancer Neg Hx      Social History   Tobacco Use   Smoking status: Some Days    Packs/day: 0.50    Years: 10.00    Additional pack years: 0.00    Total pack years: 5.00    Types: Cigarettes    Last attempt to quit: 08/11/2013    Years since quitting: 9.4   Smokeless tobacco: Never  Vaping Use   Vaping Use: Never used  Substance Use Topics   Alcohol use: Yes   Drug use: No    No current outpatient medications on file.   No current facility-administered medications for this visit.    Allergies  Allergen Reactions   Penicillins Hives    Can take cephalosporins- no reaction    Review of Systems:  Constitutional: Denies fever, chills, diaphoresis, appetite change and has fatigue.  HEENT: Denies photophobia, eye pain, redness, hearing loss, ear pain, congestion, sore throat, rhinorrhea, sneezing, mouth sores, neck pain, neck stiffness and tinnitus.   Respiratory: Denies SOB, DOE, cough, chest tightness,  and wheezing.   Cardiovascular: Denies chest pain, palpitations and leg swelling.  Genitourinary: Denies dysuria, urgency, frequency, hematuria, flank pain and difficulty urinating.  Musculoskeletal: Denies myalgias, back pain, joint swelling, arthralgias and gait problem.  Skin: No rash.  Neurological: Denies dizziness, seizures, syncope, weakness, light-headedness, numbness and headaches.  Hematological: Denies adenopathy. Easy bruising, personal or family bleeding history  Psychiatric/Behavioral: Has anxiety/depression     Physical Exam:    BP 118/70  Pulse 66   Ht 5\' 1"  (1.549 m)   Wt 194 lb 8 oz (88.2 kg)   SpO2 96%   BMI 36.75 kg/m  Wt Readings from Last 3 Encounters:  01/21/23 194 lb 8 oz (88.2 kg)  11/27/22 198 lb 12.8 oz (90.2 kg)  12/13/20 202 lb 4.8 oz (91.8 kg)   Constitutional:  Well-developed, in no acute distress. Psychiatric: Normal mood and affect. Behavior is normal. HEENT: Pupils normal.  Conjunctivae are normal. No scleral icterus. Neck supple.   Cardiovascular: Normal rate, regular rhythm. No edema Pulmonary/chest: Effort normal and breath sounds normal. No wheezing, rales or rhonchi. Abdominal: Soft, nondistended. Nontender. Bowel sounds active throughout. There are no masses palpable. No hepatomegaly. Large scar Rectal: Deferred Neurological: Alert and oriented to person place and time. Skin: Skin is warm and dry. No rashes noted.  Data Reviewed: I have personally reviewed following labs and imaging studies  CBC:    Latest Ref Rng & Units 11/27/2022    3:44 PM 12/10/2020    1:35 PM 06/25/2014    7:15 PM  CBC  WBC 3.4 - 10.8 x10E3/uL 9.1  10.1  11.7   Hemoglobin 11.1 - 15.9 g/dL 03.4  74.2  59.5   Hematocrit 34.0 - 46.6 % 45.0  45.1  44.0   Platelets 150 - 450 x10E3/uL 298  302  271     CMP:    Latest Ref Rng & Units 11/27/2022    3:44 PM 12/10/2020    1:35 PM 06/25/2014    7:15 PM  CMP  Glucose 70 - 99 mg/dL 80  99  96   BUN 6 - 20 mg/dL 18  7  12    Creatinine 0.57 - 1.00 mg/dL 6.38  7.56  4.33   Sodium 134 - 144 mmol/L 138  136  136   Potassium 3.5 - 5.2 mmol/L 4.7  4.5  3.8   Chloride 96 - 106 mmol/L 100  102  100   CO2 20 - 29 mmol/L 20  28  24    Calcium 8.7 - 10.2 mg/dL 9.6  9.3  8.3   Total Protein 6.0 - 8.5 g/dL 7.6  7.0  6.1   Total Bilirubin 0.0 - 1.2 mg/dL 0.4  0.3  0.3   Alkaline Phos 44 - 121 IU/L 75  52  56   AST 0 - 40 IU/L 17  17  18    ALT 0 - 32 IU/L 17  18  21          Edman Circle, MD 01/21/2023, 10:17 AM  Cc: Arnette Felts, FNP

## 2023-02-09 DIAGNOSIS — Z419 Encounter for procedure for purposes other than remedying health state, unspecified: Secondary | ICD-10-CM | POA: Diagnosis not present

## 2023-03-06 ENCOUNTER — Telehealth: Payer: Self-pay | Admitting: Gastroenterology

## 2023-03-06 ENCOUNTER — Encounter: Payer: Medicaid Other | Admitting: Gastroenterology

## 2023-03-06 NOTE — Telephone Encounter (Signed)
Hi Dr Chales Abrahams,   I called and left a voice message for patient regarding her appointment today, she did not respond nor came to her appointment. I will no show her for her appointment today.   Thank you

## 2023-03-12 DIAGNOSIS — Z419 Encounter for procedure for purposes other than remedying health state, unspecified: Secondary | ICD-10-CM | POA: Diagnosis not present

## 2023-04-12 DIAGNOSIS — Z419 Encounter for procedure for purposes other than remedying health state, unspecified: Secondary | ICD-10-CM | POA: Diagnosis not present

## 2023-05-12 DIAGNOSIS — Z419 Encounter for procedure for purposes other than remedying health state, unspecified: Secondary | ICD-10-CM | POA: Diagnosis not present

## 2023-06-12 DIAGNOSIS — Z419 Encounter for procedure for purposes other than remedying health state, unspecified: Secondary | ICD-10-CM | POA: Diagnosis not present

## 2023-07-07 ENCOUNTER — Encounter: Payer: Self-pay | Admitting: Nurse Practitioner

## 2023-07-07 ENCOUNTER — Ambulatory Visit: Payer: Medicaid Other | Admitting: Nurse Practitioner

## 2023-07-07 VITALS — BP 100/64 | HR 84 | Temp 98.4°F | Ht 61.0 in | Wt 194.4 lb

## 2023-07-07 DIAGNOSIS — Z2821 Immunization not carried out because of patient refusal: Secondary | ICD-10-CM | POA: Insufficient documentation

## 2023-07-07 DIAGNOSIS — R21 Rash and other nonspecific skin eruption: Secondary | ICD-10-CM | POA: Insufficient documentation

## 2023-07-07 DIAGNOSIS — Z3A09 9 weeks gestation of pregnancy: Secondary | ICD-10-CM | POA: Diagnosis not present

## 2023-07-07 DIAGNOSIS — Z6836 Body mass index (BMI) 36.0-36.9, adult: Secondary | ICD-10-CM | POA: Insufficient documentation

## 2023-07-07 DIAGNOSIS — Z114 Encounter for screening for human immunodeficiency virus [HIV]: Secondary | ICD-10-CM

## 2023-07-07 MED ORDER — CLOBETASOL PROPIONATE 0.05 % EX OINT: 1.0000 | TOPICAL_OINTMENT | Freq: Two times a day (BID) | CUTANEOUS | 0 refills | Status: DC

## 2023-07-07 NOTE — Assessment & Plan Note (Signed)
[facility]. She also had an ultrasound. Will refer to OB, she was seen at [facility] in the past. Advised to only take Tylenol as needed. If she has any issues she is to contact the office until she is established with OB

## 2023-07-07 NOTE — Progress Notes (Signed)
Madelaine Bhat, CMA,acting as a Neurosurgeon for Arnette Felts, FNP.,have documented all relevant documentation on the behalf of Arnette Felts, FNP,as directed by  Arnette Felts, FNP while in the presence of Arnette Felts, FNP.  Subjective:  Patient ID: Suzanne Lane , female    DOB: Dec 01, 1985 , 37 y.o.   MRN: [id number]  Chief Complaint  Patient presents with   Rash    HPI  Patient presents today for a rash on both of her hands, she reports it is itchy and also starting to get painful. Patient reports she has had it for about a month and half. She has applied cocunut oil, vaseline, antifungal and eczema lotion. She washes her hands a lot, now having burning. Patient denies any chest pain, SOB, or headaches. Patient reports she is about [redacted] weeks pregnant. LMP 05-06-2023 spotting. She had been having irregular menstrual cycles. She reports she would like a referral to a OBGYN. [facility] and [facility] last week.      Past Medical History:  Diagnosis Date   Chronic kidney disease    Complicated UTI (urinary tract infection) 02/21/2014   Gestational diabetes 2015   diet controlled   Normal labor 03/28/2014   Pyelonephritis affecting pregnancy 02/21/2014   Pyelonephritis affecting pregnancy in third trimester 02/20/2014   IMO SNOMED Dx Update Oct 2024     Small bowel obstruction [facility])    As infant with surgery; recent obstruction 2012   UTI (urinary tract infection) during pregnancy 09/28/2013   RX Keflex 09/28/13  01/16/14: EColi UTI       Family History  Problem Relation Age of Onset   Alcohol abuse Mother    Miscarriages / [place] Mother    Diabetes Father    Alcohol abuse Father    Cancer Maternal Grandfather    Colon cancer Maternal Grandfather    Cancer Paternal Grandfather    Early death Paternal Grandfather    Cancer Maternal Aunt    Depression Maternal Aunt    Stomach cancer Neg Hx    Rectal cancer Neg Hx    Esophageal cancer Neg Hx       Current Outpatient Medications:    clobetasol ointment (TEMOVATE) 0.05 %, Apply 1 Application topically 2 (two) times daily., Disp: 30 g, Rfl: 0   Allergies  Allergen Reactions   Penicillins Hives    Can take cephalosporins- no reaction     Review of Systems  Constitutional: Negative.   Respiratory: Negative.    Cardiovascular: Negative.   Gastrointestinal:  Positive for constipation. Negative for abdominal pain.  Neurological: Negative.   Psychiatric/Behavioral: Negative.       Today's Vitals   07/07/23 0832  BP: 100/64  Pulse: 84  Temp: 98.4 F (36.9 C)  TempSrc: Oral  Weight: 194 lb 6.4 oz (88.2 kg)  Height: 5\' 1"  (1.549 m)  PainSc: 0-No pain   Body mass index is 36.73 kg/m.  Wt Readings from Last 3 Encounters:  07/07/23 194 lb 6.4 oz (88.2 kg)  01/21/23 194 lb 8 oz (88.2 kg)  11/27/22 198 lb 12.8 oz (90.2 kg)    The ASCVD Risk score (Arnett DK, et al., 2019) failed to calculate for the following reasons:   The 2019 ASCVD risk score is only valid for ages 69 to 41  Objective:  Physical Exam Vitals reviewed.  Constitutional:      General: She is not in acute distress.    Appearance: Normal appearance. She is  well-developed. She is obese.  HENT:     Head: Normocephalic and atraumatic.  Eyes:     Pupils: Pupils are equal, round, and reactive to light.  Cardiovascular:     Rate and Rhythm: Normal rate and regular rhythm.     Pulses: Normal pulses.     Heart sounds: Normal heart sounds. No murmur heard. Pulmonary:     Effort: Pulmonary effort is normal. No respiratory distress.     Breath sounds: Normal breath sounds. No wheezing.  Musculoskeletal:        General: Normal range of motion.  Skin:    General: Skin is warm and dry.     Capillary Refill: Capillary refill takes less than 2 seconds.     Findings: Rash (dry scaly rash to bilateral hands also has flaky rash to bilateral feet) present.  Neurological:     General: No focal deficit present.      Mental Status: She is alert and oriented to person, place, and time.     Cranial Nerves: No cranial nerve deficit.     Motor: No weakness.  Psychiatric:        Mood and Affect: Mood normal.        Behavior: Behavior normal.        Thought Content: Thought content normal.        Judgment: Judgment normal.         Assessment And Plan:  Rash Assessment & Plan: Bilateral hands with dry, scaly and slightly erythematous rash. Contact dermatitis vs eczema vs syphyllis  Orders: -     Hemoglobin A1c -     T pallidum Screening [place] -     CBC with Differential/Platelet -     CMP14+EGFR -     Clobetasol Propionate; Apply 1 Application topically 2 (two) times daily.  Dispense: 30 g; Refill: 0  Influenza vaccination declined Assessment & Plan: Patient declined influenza vaccination at this time. Patient is aware that influenza vaccine prevents illness in 70% of healthy people, and reduces hospitalizations to 30-70% in elderly. This vaccine is recommended annually. Education has been provided regarding the importance of this vaccine but patient still declined. Advised may receive this vaccine at local pharmacy or Health Dept.or vaccine clinic. Aware to provide a copy of the vaccination record if obtained from local pharmacy or Health Dept.  Pt is willing to accept risk associated with refusing vaccination.    [redacted] weeks gestation of pregnancy Assessment & Plan: [facility]. She also had an ultrasound. Will refer to OB, she was seen at [facility] in the past. Advised to only take Tylenol as needed. If she has any issues she is to contact the office until she is established with OB  Orders: -     Ambulatory referral to Obstetrics / Gynecology  Encounter for HIV (human immunodeficiency virus) test -     HIV Antibody (routine testing w rflx)   Return for phy after 11/27/2023.   Patient was given opportunity to ask questions. Patient  verbalized understanding of the plan and was able to repeat key elements of the plan. All questions were answered to their satisfaction.    Jeanell Sparrow, FNP, have reviewed all documentation for this visit. The documentation on 07/07/23 for the exam, diagnosis, procedures, and orders are all accurate and complete.   IF YOU HAVE BEEN REFERRED TO A SPECIALIST, IT MAY TAKE 1-2 WEEKS TO SCHEDULE/PROCESS THE REFERRAL. IF YOU HAVE  NOT HEARD FROM US/SPECIALIST IN TWO WEEKS, PLEASE GIVE [place] A CALL AT [phone].

## 2023-07-07 NOTE — Patient Instructions (Signed)
You can get over the counter lamisil (or antifungal spray) for your feet

## 2023-07-07 NOTE — Assessment & Plan Note (Signed)

## 2023-07-07 NOTE — Assessment & Plan Note (Signed)
Bilateral hands with dry, scaly and slightly erythematous rash. Contact dermatitis vs eczema vs syphyllis

## 2023-07-08 LAB — CBC WITH DIFFERENTIAL/PLATELET
Basophils Absolute: 0 10*3/uL (ref 0.0–0.2)
Basos: 0 %
EOS (ABSOLUTE): 0.3 10*3/uL (ref 0.0–0.4)
Eos: 3 %
Hematocrit: 44.5 % (ref 34.0–46.6)
Hemoglobin: 14.5 g/dL (ref 11.1–15.9)
Immature Grans (Abs): 0 10*3/uL (ref 0.0–0.1)
Immature Granulocytes: 0 %
Lymphocytes Absolute: 1.8 10*3/uL (ref 0.7–3.1)
Lymphs: 20 %
MCH: 28.8 pg (ref 26.6–33.0)
MCHC: 32.6 g/dL (ref 31.5–35.7)
MCV: 88 fL (ref 79–97)
Monocytes Absolute: 0.6 10*3/uL (ref 0.1–0.9)
Monocytes: 7 %
Neutrophils Absolute: 6.4 10*3/uL (ref 1.4–7.0)
Neutrophils: 70 %
Platelets: 304 10*3/uL (ref 150–450)
RBC: 5.04 x10E6/uL (ref 3.77–5.28)
RDW: 13 % (ref 11.7–15.4)
WBC: 9.1 10*3/uL (ref 3.4–10.8)

## 2023-07-08 LAB — CMP14+EGFR
ALT: 12 [IU]/L (ref 0–32)
AST: 11 [IU]/L (ref 0–40)
Albumin: 3.9 g/dL (ref 3.9–4.9)
Alkaline Phosphatase: 58 [IU]/L (ref 44–121)
BUN/Creatinine Ratio: 14 (ref 9–23)
BUN: 10 mg/dL (ref 6–20)
Bilirubin Total: 0.4 mg/dL (ref 0.0–1.2)
CO2: 20 mmol/L (ref 20–29)
Calcium: 9.5 mg/dL (ref 8.7–10.2)
Chloride: 102 mmol/L (ref 96–106)
Creatinine, Ser: 0.7 mg/dL (ref 0.57–1.00)
Globulin, Total: 2.7 g/dL (ref 1.5–4.5)
Glucose: 137 mg/dL — ABNORMAL HIGH (ref 70–99)
Potassium: 4.1 mmol/L (ref 3.5–5.2)
Sodium: 137 mmol/L (ref 134–144)
Total Protein: 6.6 g/dL (ref 6.0–8.5)
eGFR: 113 mL/min/{1.73_m2} (ref >59)

## 2023-07-08 LAB — HEMOGLOBIN A1C
Est. average glucose Bld gHb Est-mCnc: 114 mg/dL
Hgb A1c MFr Bld: 5.6 % (ref 4.8–5.6)

## 2023-07-08 LAB — HIV ANTIBODY (ROUTINE TESTING W REFLEX): HIV Screen 4th Generation wRfx: NONREACTIVE

## 2023-07-08 LAB — T PALLIDUM SCREENING CASCADE: T pallidum Antibodies (TP-PA): NONREACTIVE

## 2023-07-12 DIAGNOSIS — Z419 Encounter for procedure for purposes other than remedying health state, unspecified: Secondary | ICD-10-CM | POA: Diagnosis not present

## 2023-07-22 ENCOUNTER — Encounter: Payer: Self-pay | Admitting: *Deleted

## 2023-07-27 NOTE — Progress Notes (Signed)
New OB Intake  I visited with Suzanne Lane  on 07/28/23 at  2:15 PM EST in person and verified that I am speaking with the correct person using two identifiers. Nurse is located at [facility] and pt is located at [facility].  I discussed the limitations, risks, security and privacy concerns of performing an evaluation and management service by telephone and the availability of in person appointments. I also discussed with the patient that there may be a patient responsible charge related to this service. The patient expressed understanding and agreed to proceed.  I explained I am completing New OB Intake today. We discussed EDD of 02/10/2024, by Last Menstrual Period. Pt is [id number]. I reviewed her allergies, medications and Medical/Surgical/OB history.    Patient Active Problem List   Diagnosis Date Noted   Rash 07/07/2023   Influenza vaccination declined 07/07/2023   [redacted] weeks gestation of pregnancy 07/07/2023   Gestational diabetes 02/02/2014   Concerns addressed today  Delivery Plans Plans to deliver at [facility]. Discussed the nature of our practice with multiple providers including residents and students. Due to the size of the practice, the delivering provider may not be the same as those providing prenatal care.   Patient is not interested in water birth. Offered upcoming OB visit with CNM to discuss further.  MyChart/Babyscripts MyChart access verified. I explained pt will have some visits in office and some virtually. Babyscripts instructions given and order placed. Patient verifies receipt of registration text/e-mail. Account successfully created and app downloaded. If patient is a candidate for Optimized scheduling, add to sticky note.   Blood Pressure Cuff/Weight Scale Patient has BP cuff at home. Explained after first prenatal appt pt will check weekly and document in Babyscripts. Patient does have weight scale.   Anatomy US Explained first scheduled [place] will be around 19 weeks. Anatomy  US scheduled for 09/18/23 at 1415.  Is patient a CenteringPregnancy candidate?  Declined Declined due to Group setting   Is patient a Mom+Baby Combined Care candidate?  Not a candidate   If accepted, confirm patient does not intend to move from the area for at least 12 months, then notify Mom+Baby staff  Interested in [place]? If yes, send referral and doula dot phrase.   Is patient a candidate for Babyscripts Optimization? Yes, patient accepted    First visit review I reviewed new OB appt with patient. Explained pt will be seen by [place] Bing MD on 08/10/23 at 1455 at first visit. Discussed Avelina Laine genetic screening with patient. Patient interested in both [place] and [place].. Routine prenatal labs.   Last Pap Last PAP done on 09/28/13--normal. Patient needs PAP done.   Meryl Crutch, RN 07/28/23 at [phone]

## 2023-07-28 ENCOUNTER — Other Ambulatory Visit (HOSPITAL_COMMUNITY)
Admission: RE | Admit: 2023-07-28 | Discharge: 2023-07-28 | Disposition: A | Discharge status: Home / Self Care | Payer: Medicaid Other | Source: Ambulatory Visit | Attending: Family Medicine | Admitting: Family Medicine

## 2023-07-28 ENCOUNTER — Other Ambulatory Visit: Payer: Self-pay

## 2023-07-28 ENCOUNTER — Ambulatory Visit: Payer: Medicaid Other

## 2023-07-28 VITALS — BP 110/58 | HR 67 | Wt 192.6 lb

## 2023-07-28 DIAGNOSIS — O0990 Supervision of high risk pregnancy, unspecified, unspecified trimester: Secondary | ICD-10-CM | POA: Diagnosis not present

## 2023-07-28 DIAGNOSIS — O0991 Supervision of high risk pregnancy, unspecified, first trimester: Secondary | ICD-10-CM | POA: Insufficient documentation

## 2023-07-28 DIAGNOSIS — Z3A12 12 weeks gestation of pregnancy: Secondary | ICD-10-CM | POA: Diagnosis not present

## 2023-07-29 LAB — CBC/D/PLT+RPR+RH+ABO+RUBIGG...
Antibody Screen: NEGATIVE
Basophils Absolute: 0 10*3/uL (ref 0.0–0.2)
Basos: 0 %
EOS (ABSOLUTE): 0.2 10*3/uL (ref 0.0–0.4)
Eos: 2 %
HCV Ab: NONREACTIVE
HIV Screen 4th Generation wRfx: NONREACTIVE
Hematocrit: 42.6 % (ref 34.0–46.6)
Hemoglobin: 14.2 g/dL (ref 11.1–15.9)
Hepatitis B Surface Ag: NEGATIVE
Immature Grans (Abs): 0.1 10*3/uL (ref 0.0–0.1)
Immature Granulocytes: 1 %
Lymphocytes Absolute: 3.4 10*3/uL — ABNORMAL HIGH (ref 0.7–3.1)
Lymphs: 29 %
MCH: 28.6 pg (ref 26.6–33.0)
MCHC: 33.3 g/dL (ref 31.5–35.7)
MCV: 86 fL (ref 79–97)
Monocytes Absolute: 0.8 10*3/uL (ref 0.1–0.9)
Monocytes: 7 %
Neutrophils Absolute: 7.4 10*3/uL — ABNORMAL HIGH (ref 1.4–7.0)
Neutrophils: 61 %
Platelets: 311 10*3/uL (ref 150–450)
RBC: 4.96 x10E6/uL (ref 3.77–5.28)
RDW: 12.8 % (ref 11.7–15.4)
RPR Ser Ql: NONREACTIVE
Rh Factor: POSITIVE
Rubella Antibodies, IGG: 1.36 {index} (ref >0.99)
WBC: 12 10*3/uL — ABNORMAL HIGH (ref 3.4–10.8)

## 2023-07-29 LAB — HCV INTERPRETATION

## 2023-07-29 LAB — HEMOGLOBIN A1C
Est. average glucose Bld gHb Est-mCnc: 114 mg/dL
Hgb A1c MFr Bld: 5.6 % (ref 4.8–5.6)

## 2023-07-30 LAB — GC/CHLAMYDIA PROBE AMP (~~LOC~~) NOT AT ARMC
Chlamydia: NEGATIVE
Comment: NEGATIVE
Comment: NORMAL
Neisseria Gonorrhea: NEGATIVE

## 2023-08-05 LAB — PANORAMA PRENATAL TEST FULL PANEL:PANORAMA TEST PLUS 5 ADDITIONAL MICRODELETIONS: FETAL FRACTION: 5.7

## 2023-08-05 LAB — HORIZON CUSTOM: REPORT SUMMARY: NEGATIVE

## 2023-08-10 ENCOUNTER — Encounter: Payer: Self-pay | Admitting: Obstetrics and Gynecology

## 2023-08-10 ENCOUNTER — Other Ambulatory Visit: Payer: Self-pay

## 2023-08-10 ENCOUNTER — Other Ambulatory Visit (HOSPITAL_COMMUNITY)
Admission: RE | Admit: 2023-08-10 | Discharge: 2023-08-10 | Disposition: A | Discharge status: Home / Self Care | Payer: Medicaid Other | Source: Ambulatory Visit | Attending: Obstetrics and Gynecology | Admitting: Obstetrics and Gynecology

## 2023-08-10 ENCOUNTER — Ambulatory Visit (INDEPENDENT_AMBULATORY_CARE_PROVIDER_SITE_OTHER): Payer: Medicaid Other | Admitting: Obstetrics and Gynecology

## 2023-08-10 VITALS — BP 121/81 | HR 77 | Wt 195.4 lb

## 2023-08-10 DIAGNOSIS — O0990 Supervision of high risk pregnancy, unspecified, unspecified trimester: Secondary | ICD-10-CM

## 2023-08-10 DIAGNOSIS — O99211 Obesity complicating pregnancy, first trimester: Secondary | ICD-10-CM | POA: Diagnosis not present

## 2023-08-10 DIAGNOSIS — O99210 Obesity complicating pregnancy, unspecified trimester: Secondary | ICD-10-CM

## 2023-08-10 DIAGNOSIS — Z8632 Personal history of gestational diabetes: Secondary | ICD-10-CM

## 2023-08-10 DIAGNOSIS — O09521 Supervision of elderly multigravida, first trimester: Secondary | ICD-10-CM | POA: Diagnosis not present

## 2023-08-10 DIAGNOSIS — O09529 Supervision of elderly multigravida, unspecified trimester: Secondary | ICD-10-CM | POA: Insufficient documentation

## 2023-08-10 DIAGNOSIS — Z6836 Body mass index (BMI) 36.0-36.9, adult: Secondary | ICD-10-CM | POA: Diagnosis not present

## 2023-08-10 DIAGNOSIS — Z1332 Encounter for screening for maternal depression: Secondary | ICD-10-CM | POA: Diagnosis not present

## 2023-08-10 DIAGNOSIS — Z3A13 13 weeks gestation of pregnancy: Secondary | ICD-10-CM | POA: Diagnosis not present

## 2023-08-10 DIAGNOSIS — Z8719 Personal history of other diseases of the digestive system: Secondary | ICD-10-CM

## 2023-08-10 DIAGNOSIS — O0991 Supervision of high risk pregnancy, unspecified, first trimester: Secondary | ICD-10-CM

## 2023-08-10 MED ORDER — ASPIRIN 81 MG PO TBEC: 81.0000 mg | DELAYED_RELEASE_TABLET | Freq: Every day | ORAL | 1 refills | Status: DC

## 2023-08-11 LAB — PROTEIN / CREATININE RATIO, URINE
Creatinine, Urine: 92.4 mg/dL
Protein, Ur: 9.5 mg/dL
Protein/Creat Ratio: 103 mg/g{creat} (ref 0–200)

## 2023-08-11 NOTE — Progress Notes (Signed)
New OB Note  08/10/2023   Clinic: [facility]  Chief Complaint: new OB  Transfer of Care Patient: no  History of Present Illness: Ms. Harron is a 37 y.o. G4P1021 at 13/5 weeks (EDC 7/2, based on Patient's last menstrual period was 05/06/2023.=8wk u/s)    Preg complicated by has History of gestational diabetes mellitus (GDM); Rash; BMI 36.0-36.9,adult; Supervision of high risk pregnancy, antepartum; AMA (advanced maternal age) multigravida 35+, first trimester; Obesity in pregnancy; and History of small bowel obstruction on their problem list.   No OB complaints or issues  ROS: A 12-point review of systems was performed and negative, except as stated in the above HPI.  OBGYN History: As per HPI. OB History  Gravida Para Term Preterm AB Living  4 1 1  0 2 1  SAB IAB Ectopic Multiple Live Births  2 0 0 0 1    # Outcome Date GA Lbr Len/2nd Weight Sex Type Anes PTL Lv  4 Current           3 SAB 2023          2 SAB 03/2020 [redacted]w[redacted]d         1 Term 03/30/14 [redacted]w[redacted]d 32:54 / 02:23 6 lb 10.9 oz (3.031 kg) F Vag-Spont EPI  LIV     Birth Comments: caput   Prior children are healthy, doing well, and without any problems or issues: yes   Past Medical History: Past Medical History:  Diagnosis Date   Chronic kidney disease    Complicated UTI (urinary tract infection) 02/21/2014   Gestational diabetes 2015   diet controlled   Pyelonephritis affecting pregnancy in third trimester 02/20/2014   IMO SNOMED Dx Update Oct 2024     Small bowel obstruction (HCC)    As infant with surgery; recent obstruction 2012   UTI (urinary tract infection) during pregnancy 09/28/2013   RX Keflex 09/28/13  01/16/14: EColi UTI      Past Surgical History: Past Surgical History:  Procedure Laterality Date   ABDOMINAL SURGERY     Surgery as infant    Family History:  Family History  Problem Relation Age of Onset   Alcohol abuse Mother    Miscarriages / [place] Mother     Diabetes Father    Alcohol abuse Father    Sickle cell trait Daughter    Cancer Maternal Aunt    Depression Maternal Aunt    Cancer Maternal Grandfather    Colon cancer Maternal Grandfather    Cancer Paternal Grandfather    Early death Paternal Grandfather    Stomach cancer Neg Hx    Rectal cancer Neg Hx    Esophageal cancer Neg Hx    Social History:  Social History   Socioeconomic History   Marital status: Single    Spouse name: Not on file   Number of children: 1   Years of education: Not on file   Highest education level: Some college, no degree  Occupational History   Not on file  Tobacco Use   Smoking status: Former    Current packs/day: 0.00    Average packs/day: 0.5 packs/day for 10.0 years (5.0 ttl pk-yrs)    Types: Cigarettes    Start date: 08/12/2003    Quit date: 08/11/2013    Years since quitting: 10.0   Smokeless tobacco: Never  Vaping Use   Vaping status: Never Used  Substance and Sexual Activity   Alcohol use: Not Currently   Drug use: No  Sexual activity: Yes  Other Topics Concern   Not on file  Social History Narrative   Not on file   Social Drivers of Health   Financial Resource Strain: Low Risk  (07/06/2023)   Overall Financial Resource Strain (CARDIA)    Difficulty of Paying Living Expenses: Not very hard  Food Insecurity: No Food Insecurity (07/06/2023)   Hunger Vital Sign    Worried About Running Out of Food in the Last Year: Never true    Ran Out of Food in the Last Year: Never true  Transportation Needs: Unmet Transportation Needs (07/06/2023)   PRAPARE - Administrator, Civil Service (Medical): Yes    Lack of Transportation (Non-Medical): No  Physical Activity: Insufficiently Active (07/06/2023)   Exercise Vital Sign    Days of Exercise per Week: 3 days    Minutes of Exercise per Session: 10 min  Stress: No Stress Concern Present (07/06/2023)   [employer] of Occupational Health - Occupational Stress Questionnaire     Feeling of Stress : Not at all  Social Connections: Moderately Isolated (07/06/2023)   Social Connection and Isolation Panel [NHANES]    Frequency of Communication with Friends and Family: Once a week    Frequency of Social Gatherings with Friends and Family: Three times a week    Attends Religious Services: Never    Active Member of Clubs or Organizations: No    Attends Engineer, structural: Not on file    Marital Status: Living with partner  Intimate Partner Violence: Not on file    Allergy: Allergies  Allergen Reactions   Penicillins Hives    Can take cephalosporins- no reaction   Current Outpatient Medications: Prenatal vitamin   Physical Exam:   BP 121/81   Pulse 77   Wt 195 lb 6.4 oz (88.6 kg)   LMP 05/06/2023   BMI 36.92 kg/m  Body mass index is 36.92 kg/m.   Vag. Bleeding: None. Fundal height: not applicable FHTs: 160s  General appearance: Well nourished, well developed female in no acute distress.  Neck:  Supple, normal appearance, and no thyromegaly  Cardiovascular: S1, S2 normal, no murmur, rub or gallop, regular rate and rhythm Respiratory:  Clear to auscultation bilateral. Normal respiratory effort Abdomen: positive bowel sounds and no masses, hernias; diffusely non tender to palpation, non distended Breasts: breasts appear normal, no suspicious masses, no skin or nipple changes or axillary nodes, and normal palpation. Neuro/Psych:  Normal mood and affect.  Skin:  Warm and dry.  Lymphatic:  No inguinal lymphadenopathy.   Pelvic exam: is not limited by body habitus EGBUS: within normal limits, Vagina: within normal limits and with no blood in the vault, Cervix: normal appearing cervix without discharge or lesions, closed/long/high, Uterus:  enlarged, c/w 14 week size, and Adnexa:  normal adnexa and no mass, fullness, tenderness  Laboratory: reviewed  Imaging:  Bedside u/s with SLIUP, +FM, normal AF and FHR in the 160s  Assessment:  patient doing well  Plan: 1. Supervision of high risk pregnancy, antepartum (Primary) Patient amenable to low dose ASA which was sent in for her. Offer afp next visit. Already scheduled for anatomy u/s.  - Cytology - PAP( [place]) - Protein / creatinine ratio, urine - Culture, OB Urine - Flu vaccine trivalent PF, 6mos and older(Flulaval,Afluria,Fluarix,Fluzone)  2. AMA (advanced maternal age) multigravida 35+, first trimester Panorama low risk  3. BMI 36.0-36.9,adult Weight stable. 25-30lbs total weight gain recommended  4. Obesity in pregnancy  5. History  of gestational diabetes mellitus (GDM) Early A1c neg  6. History of small bowel obstruction Last in 2012. No issues with last pregnancy. ED precautions given  Problem list reviewed and updated.  Follow up in 3 weeks.  The nature of [facility] with multiple MDs and other Advanced Practice Providers was explained to patient; also emphasized that residents, students are part of our team.  >50% of 35 min visit spent on counseling and coordination of care.  Return in about 3 weeks (around 08/31/2023) for in person, low risk ob, md or app.  Future Appointments  Date Time Provider Department Center  08/31/2023 10:55 AM Sundra Aland, MD [facility]  09/18/2023  2:15 PM [facility]URSE [facility]  09/18/2023  2:30 PM [facility] US4 [facility]  11/30/2023 10:20 AM Arnette Felts, FNP [facility] None    Cornelia Copa MD Attending Center for [facility])

## 2023-08-12 DIAGNOSIS — Z419 Encounter for procedure for purposes other than remedying health state, unspecified: Secondary | ICD-10-CM | POA: Diagnosis not present

## 2023-08-13 ENCOUNTER — Encounter: Payer: Self-pay | Admitting: *Deleted

## 2023-08-13 LAB — URINE CULTURE, OB REFLEX

## 2023-08-13 LAB — CULTURE, OB URINE

## 2023-08-17 LAB — CYTOLOGY - PAP
Comment: NEGATIVE
Diagnosis: NEGATIVE
High risk HPV: NEGATIVE

## 2023-08-28 ENCOUNTER — Encounter: Payer: Self-pay | Admitting: Obstetrics and Gynecology

## 2023-08-31 ENCOUNTER — Ambulatory Visit: Payer: Medicaid Other | Admitting: Family Medicine

## 2023-08-31 ENCOUNTER — Other Ambulatory Visit: Payer: Self-pay

## 2023-08-31 VITALS — BP 122/69 | HR 97 | Wt 197.4 lb

## 2023-08-31 DIAGNOSIS — O09522 Supervision of elderly multigravida, second trimester: Secondary | ICD-10-CM

## 2023-08-31 DIAGNOSIS — O99212 Obesity complicating pregnancy, second trimester: Secondary | ICD-10-CM

## 2023-08-31 DIAGNOSIS — Z8632 Personal history of gestational diabetes: Secondary | ICD-10-CM

## 2023-08-31 DIAGNOSIS — O09521 Supervision of elderly multigravida, first trimester: Secondary | ICD-10-CM

## 2023-08-31 DIAGNOSIS — O0990 Supervision of high risk pregnancy, unspecified, unspecified trimester: Secondary | ICD-10-CM

## 2023-08-31 DIAGNOSIS — Z8719 Personal history of other diseases of the digestive system: Secondary | ICD-10-CM | POA: Diagnosis not present

## 2023-08-31 DIAGNOSIS — N949 Unspecified condition associated with female genital organs and menstrual cycle: Secondary | ICD-10-CM

## 2023-08-31 DIAGNOSIS — O0992 Supervision of high risk pregnancy, unspecified, second trimester: Secondary | ICD-10-CM

## 2023-08-31 DIAGNOSIS — Z3A16 16 weeks gestation of pregnancy: Secondary | ICD-10-CM | POA: Diagnosis not present

## 2023-08-31 DIAGNOSIS — O99210 Obesity complicating pregnancy, unspecified trimester: Secondary | ICD-10-CM

## 2023-08-31 DIAGNOSIS — Z23 Encounter for immunization: Secondary | ICD-10-CM

## 2023-08-31 MED ORDER — PRENATAL PLUS 27-1 MG PO TABS: 1.0000 | ORAL_TABLET | Freq: Every day | ORAL | 12 refills | Status: AC

## 2023-08-31 NOTE — Progress Notes (Signed)
   PRENATAL VISIT NOTE  Subjective:  Suzanne Lane is a 38 y.o. G4P1021 at [redacted]w[redacted]d being seen today for ongoing prenatal care.  She is currently monitored for the following issues for this high-risk pregnancy and has History of gestational diabetes mellitus (GDM); BMI 36.0-36.9,adult; Supervision of high risk pregnancy, antepartum; AMA (advanced maternal age) multigravida 35+, first trimester; Obesity in pregnancy; and History of small bowel obstruction on their problem list.  Patient reports no complaints.  Contractions: Not present. Vag. Bleeding: None.   . Denies leaking of fluid.   The following portions of the patient's history were reviewed and updated as appropriate: allergies, current medications, past family history, past medical history, past social history, past surgical history and problem list.   Objective:   Vitals:   08/31/23 1100  BP: 122/69  Pulse: 97  Weight: 197 lb 6.4 oz (89.5 kg)    Fetal Status: Fetal Heart Rate (bpm): 146         General:  Alert, oriented and cooperative. Patient is in no acute distress.  Skin: Skin is warm and dry. No rash noted.   Cardiovascular: Normal heart rate noted  Respiratory: Normal respiratory effort, no problems with respiration noted  Abdomen: Soft, gravid, appropriate for gestational age.  Pain/Pressure: Absent     Pelvic: Cervical exam deferred        Extremities: Normal range of motion.  Edema: None  Mental Status: Normal mood and affect. Normal behavior. Normal judgment and thought content.   Assessment and Plan:  Pregnancy: [id number] at [redacted]w[redacted]d  Supervision of high risk pregnancy, antepartum  [redacted] weeks gestation of pregnancy Prenatal course reviewed BP, HR, FHR normal  Round ligament pain Rec Tylenol, stretching, heat Given stretches If worsens, pt instructed to reach out, can consider Rx for Flexeril  Flu vaccine need Flu shot given today  History of gestational diabetes mellitus (GDM) Discussed importance of  watching carb intake, eating protein heavy snacks A1c wnl on early evaluation  AMA (advanced maternal age) multigravida 35+, first trimester On ldASA  Obesity in pregnancy Watching TWG closely    Preterm labor symptoms and general obstetric precautions including but not limited to vaginal bleeding, contractions, leaking of fluid and fetal movement were reviewed in detail with the patient. Please refer to After Visit Summary for other counseling recommendations.   Return in about 4 weeks (around 09/28/2023) for Routine prenatal care, In person, MD or APP.  Future Appointments  Date Time Provider Department Center  09/18/2023  2:15 PM [facility]URSE [facility]  09/18/2023  2:30 PM [facility] US4 [facility]  11/30/2023 10:20 AM Arnette Felts, FNP [facility] None    Sundra Aland, MD

## 2023-08-31 NOTE — Patient Instructions (Signed)
 Safe Medications in Pregnancy   Acne: Benzoyl Peroxide Salicylic Acid  Backache/Headache: Tylenol: 2 regular strength every 4 hours OR              2 Extra strength every 6 hours  Colds/Coughs/Allergies: Benadryl (alcohol free) 25 mg every 6 hours as needed Breath right strips Claritin Cepacol throat lozenges Chloraseptic throat spray Cold-Eeze- up to three times per day Cough drops, alcohol free Flonase (by prescription only) Guaifenesin Mucinex Robitussin DM (plain only, alcohol free) Saline nasal spray/drops Sudafed (pseudoephedrine) & Actifed ** use only after [redacted] weeks gestation and if you do not have high blood pressure Tylenol Vicks Vaporub Zinc lozenges Zyrtec   Constipation: Colace Ducolax suppositories Fleet enema Glycerin suppositories Metamucil Milk of magnesia Miralax Senokot Smooth move tea  Diarrhea: Kaopectate Imodium A-D  *NO pepto Bismol  Hemorrhoids: Anusol Anusol HC Preparation H Tucks  Indigestion: Tums Maalox Mylanta Cimetidine (Tagamet HB)** preferred in pregnancy Famotidine (Pepcid) Ranitidine (Zantac)  Insomnia: Benadryl (alcohol free) 25mg  every 6 hours as needed Tylenol PM Unisom, no Gelcaps  Leg Cramps: Tums MagGel  Nausea/Vomiting:  Bonine Dramamine Emetrol Ginger extract Sea bands Meclizine   Nausea medication to take during pregnancy:  Unisom (doxylamine succinate 25 mg tablets) Take one tablet daily at bedtime. If symptoms are not adequately controlled, the dose can be increased to a maximum recommended dose of two tablets daily (1/2 tablet in the morning, 1/2 tablet mid-afternoon and one at bedtime). Vitamin B6 100mg  tablets. Take one tablet twice a day (up to 200 mg per day).  Skin Rashes: Aveeno products Benadryl cream or 25mg  every 6 hours as needed Calamine Lotion 1% cortisone cream  Yeast infection: Gyne-lotrimin 7 Monistat 7   **If taking multiple medications, please check labels to avoid  duplicating the same active ingredients **take medication as directed on the label ** Do not exceed 4000 mg of tylenol in 24 hours **Do not take medications that contain aspirin or ibuprofen

## 2023-09-02 LAB — AFP, SERUM, OPEN SPINA BIFIDA
AFP MoM: 2.29
AFP Value: 70 ng/mL
Gest. Age on Collection Date: 16.7 wk
Maternal Age At EDD: 38.8 a
OSBR Risk 1 IN: 435
Test Results:: NEGATIVE
Weight: 197 [lb_av]

## 2023-09-12 DIAGNOSIS — Z419 Encounter for procedure for purposes other than remedying health state, unspecified: Secondary | ICD-10-CM | POA: Diagnosis not present

## 2023-09-18 ENCOUNTER — Ambulatory Visit: Payer: Medicaid Other | Admitting: *Deleted

## 2023-09-18 ENCOUNTER — Ambulatory Visit (HOSPITAL_BASED_OUTPATIENT_CLINIC_OR_DEPARTMENT_OTHER): Payer: Medicaid Other | Admitting: Obstetrics and Gynecology

## 2023-09-18 ENCOUNTER — Ambulatory Visit: Payer: Medicaid Other | Attending: Obstetrics and Gynecology

## 2023-09-18 ENCOUNTER — Other Ambulatory Visit: Payer: Self-pay | Admitting: *Deleted

## 2023-09-18 VITALS — BP 116/66 | HR 87

## 2023-09-18 DIAGNOSIS — O99212 Obesity complicating pregnancy, second trimester: Secondary | ICD-10-CM

## 2023-09-18 DIAGNOSIS — Z3A19 19 weeks gestation of pregnancy: Secondary | ICD-10-CM | POA: Diagnosis not present

## 2023-09-18 DIAGNOSIS — O09522 Supervision of elderly multigravida, second trimester: Secondary | ICD-10-CM

## 2023-09-18 DIAGNOSIS — O09212 Supervision of pregnancy with history of pre-term labor, second trimester: Secondary | ICD-10-CM | POA: Diagnosis not present

## 2023-09-18 DIAGNOSIS — E669 Obesity, unspecified: Secondary | ICD-10-CM | POA: Diagnosis not present

## 2023-09-18 DIAGNOSIS — O09292 Supervision of pregnancy with other poor reproductive or obstetric history, second trimester: Secondary | ICD-10-CM

## 2023-09-18 DIAGNOSIS — E6689 Other obesity not elsewhere classified: Secondary | ICD-10-CM | POA: Diagnosis not present

## 2023-09-18 DIAGNOSIS — O0990 Supervision of high risk pregnancy, unspecified, unspecified trimester: Secondary | ICD-10-CM | POA: Insufficient documentation

## 2023-09-18 DIAGNOSIS — Z362 Encounter for other antenatal screening follow-up: Secondary | ICD-10-CM

## 2023-09-18 DIAGNOSIS — O0991 Supervision of high risk pregnancy, unspecified, first trimester: Secondary | ICD-10-CM | POA: Diagnosis not present

## 2023-09-18 NOTE — Progress Notes (Signed)
  Maternal-Fetal Medicine Consultation I had the pleasure of seeing Ms. Suzanne Lane [facility]. She is G4 P1021 at 19w 2d gestation and is here for fetal anatomy scan. She was accompanied by her husband. Advanced maternal age.  On cell-free fetal DNA screening, the risks of fetal aneuploidies are not increased.  MSAFP screening showed low risk for open neural tube defects.  Carrier screening showed that she does not have sickle cell trait. Her daughter has hemoglobin C trait.  Obstetric history is significant for a term vaginal delivery in 2015 of a female infant weighing 6 pounds 11 ounces at birth.  Ultrasound We performed a fetal anatomical survey.  Amniotic fluid is normal good fetal activity seen.  Fetal biometry is consistent with the previously established dates.  No markers of aneuploidies or obvious fetal structural defects are seen. Patient understands the limitations of ultrasound in detecting fetal anomalies. As maternal obesity imposes limitations on the resolution of images, fetal anomalies may be missed.  Advanced maternal age and a child with hemoglobin C trait. I discussed the significance and limitations of cell free fetal DNA screening that has a greater detection rate for Down syndrome.  It does not, however, detect all chromosomal anomalies.  Amniocentesis is an option.  I explained the procedure and possible complication of miscarriage (1 and 500 procedures).  Patient opted not to have amniocentesis. Daughter must of acquired hemoglobin C trait from her father (same father).  Patient's carrier screening ruled out hemoglobin C trait.  History of Gestational Diabetes Patient had gestational diabetes and a previous pregnancy.  She is a high risk for recurrence of gestational diabetes.  I discussed early screening.  Alternatively, screening may be performed at 24 to [redacted] weeks gestation.  Recommendations -Screen for gestational diabetes at 24  to [redacted] weeks gestation. -An appointment was made for her to return in 5 weeks for completion of fetal anatomy.   Thank you for consultation.  If you have any questions or concerns, please contact me the [facility].  Consultation including face-to-face (more than 50%) counseling 30 minutes.

## 2023-09-27 NOTE — Progress Notes (Unsigned)
   PRENATAL VISIT NOTE  Subjective:  Suzanne Lane is a 38 y.o. G4P1021 at [redacted]w[redacted]d being seen today for ongoing prenatal care.  She is currently monitored for the following issues for this {Blank single:19197::"high-risk","low-risk"} pregnancy and has History of gestational diabetes mellitus (GDM); BMI 36.0-36.9,adult; Supervision of high risk pregnancy, antepartum; AMA (advanced maternal age) multigravida 35+, first trimester; Obesity in pregnancy; and History of small bowel obstruction on their problem list.  Patient reports {sx:14538}.   .  .   . Denies leaking of fluid.   The following portions of the patient's history were reviewed and updated as appropriate: allergies, current medications, past family history, past medical history, past social history, past surgical history and problem list.   Objective:  There were no vitals filed for this visit.  Fetal Status:           General:  Alert, oriented and cooperative. Patient is in no acute distress.  Skin: Skin is warm and dry. No rash noted.   Cardiovascular: Normal heart rate noted  Respiratory: Normal respiratory effort, no problems with respiration noted  Abdomen: Soft, gravid, appropriate for gestational age.        Pelvic: {Blank single:19197::"Cervical exam performed in the presence of a chaperone","Cervical exam deferred"}        Extremities: Normal range of motion.     Mental Status: Normal mood and affect. Normal behavior. Normal judgment and thought content.   Assessment and Plan:  Pregnancy: G4P1021 at [redacted]w[redacted]d 1. Supervision of high risk pregnancy, antepartum (Primary)   2. History of gestational diabetes mellitus (GDM) A1C 5.6, early GTT per MFM 24-25 wks  Anticipatory guidance regarding GTT and labs next visit, discussed NPO status after midnight    3. AMA (advanced maternal age) multigravida 35+, first trimester Continue ASA?? Follow up 3/14 to complete anatomy   4. [redacted] weeks gestation of pregnancy   {Blank  single:19197::"Term","Preterm"} labor symptoms and general obstetric precautions including but not limited to vaginal bleeding, contractions, leaking of fluid and fetal movement were reviewed in detail with the patient. Please refer to After Visit Summary for other counseling recommendations.   No follow-ups on file.  Future Appointments  Date Time Provider Department Center  09/28/2023 10:55 AM Sue Lush, FNP [facility]  10/23/2023  2:15 PM [facility] NURSE [facility]  10/23/2023  2:30 PM [facility] US3 [facility]  11/30/2023 10:20 AM Arnette Felts, FNP [facility] None    Albertine Grates, FNP

## 2023-09-28 ENCOUNTER — Other Ambulatory Visit: Payer: Self-pay

## 2023-09-28 ENCOUNTER — Ambulatory Visit: Payer: Medicaid Other | Admitting: Obstetrics and Gynecology

## 2023-09-28 VITALS — BP 137/77 | HR 97 | Wt 200.6 lb

## 2023-09-28 DIAGNOSIS — O09521 Supervision of elderly multigravida, first trimester: Secondary | ICD-10-CM

## 2023-09-28 DIAGNOSIS — Z8632 Personal history of gestational diabetes: Secondary | ICD-10-CM | POA: Diagnosis not present

## 2023-09-28 DIAGNOSIS — O0990 Supervision of high risk pregnancy, unspecified, unspecified trimester: Secondary | ICD-10-CM

## 2023-09-28 DIAGNOSIS — K5900 Constipation, unspecified: Secondary | ICD-10-CM

## 2023-09-28 DIAGNOSIS — Z3A20 20 weeks gestation of pregnancy: Secondary | ICD-10-CM | POA: Diagnosis not present

## 2023-09-28 DIAGNOSIS — O0992 Supervision of high risk pregnancy, unspecified, second trimester: Secondary | ICD-10-CM | POA: Diagnosis not present

## 2023-09-28 DIAGNOSIS — O09522 Supervision of elderly multigravida, second trimester: Secondary | ICD-10-CM | POA: Diagnosis not present

## 2023-09-28 MED ORDER — DOCUSATE CALCIUM 240 MG PO CAPS: 240.0000 mg | ORAL_CAPSULE | Freq: Every day | ORAL | 0 refills | Status: DC

## 2023-09-28 NOTE — Progress Notes (Addendum)
 Pt reports every time that she has a bowel movement she bleeds, has been constipated, assumed it was initially coming from anus but iut's coming from Vagina.

## 2023-10-01 ENCOUNTER — Inpatient Hospital Stay (HOSPITAL_COMMUNITY): Payer: Medicaid Other

## 2023-10-01 ENCOUNTER — Inpatient Hospital Stay (HOSPITAL_COMMUNITY)
Admission: AD | Admit: 2023-10-01 | Discharge: 2023-10-01 | Disposition: A | Discharge status: Home / Self Care | Payer: Medicaid Other | Attending: Obstetrics & Gynecology | Admitting: Obstetrics & Gynecology

## 2023-10-01 ENCOUNTER — Encounter (HOSPITAL_COMMUNITY): Payer: Self-pay | Admitting: Obstetrics & Gynecology

## 2023-10-01 DIAGNOSIS — O98512 Other viral diseases complicating pregnancy, second trimester: Secondary | ICD-10-CM | POA: Diagnosis not present

## 2023-10-01 DIAGNOSIS — J988 Other specified respiratory disorders: Secondary | ICD-10-CM | POA: Diagnosis not present

## 2023-10-01 DIAGNOSIS — O09522 Supervision of elderly multigravida, second trimester: Secondary | ICD-10-CM | POA: Diagnosis not present

## 2023-10-01 DIAGNOSIS — U071 COVID-19: Secondary | ICD-10-CM | POA: Diagnosis not present

## 2023-10-01 DIAGNOSIS — Z3A21 21 weeks gestation of pregnancy: Secondary | ICD-10-CM | POA: Insufficient documentation

## 2023-10-01 DIAGNOSIS — O99512 Diseases of the respiratory system complicating pregnancy, second trimester: Secondary | ICD-10-CM | POA: Diagnosis not present

## 2023-10-01 DIAGNOSIS — R059 Cough, unspecified: Secondary | ICD-10-CM | POA: Diagnosis not present

## 2023-10-01 DIAGNOSIS — R918 Other nonspecific abnormal finding of lung field: Secondary | ICD-10-CM | POA: Diagnosis not present

## 2023-10-01 LAB — RESP PANEL BY RT-PCR (RSV, FLU A&B, COVID)  RVPGX2
Influenza A by PCR: NEGATIVE
Influenza B by PCR: NEGATIVE
Resp Syncytial Virus by PCR: NEGATIVE
SARS Coronavirus 2 by RT PCR: POSITIVE — AB

## 2023-10-01 LAB — GROUP A STREP BY PCR: Group A Strep by PCR: NOT DETECTED

## 2023-10-01 MED ORDER — ACETAMINOPHEN-CAFFEINE 500-65 MG PO TABS
2.0000 | ORAL_TABLET | Freq: Once | ORAL | Status: AC
  Administered 2023-10-01: 2 via ORAL
  Filled 2023-10-01: qty 2

## 2023-10-01 MED ORDER — NIRMATRELVIR/RITONAVIR (PAXLOVID)TABLET: 3.0000 | ORAL_TABLET | Freq: Two times a day (BID) | ORAL | 0 refills | Status: AC

## 2023-10-01 MED ORDER — CYCLOBENZAPRINE HCL 5 MG PO TABS
10.0000 mg | ORAL_TABLET | Freq: Three times a day (TID) | ORAL | Status: DC | PRN
  Administered 2023-10-01: 10 mg via ORAL
  Filled 2023-10-01: qty 2

## 2023-10-01 NOTE — Discharge Instructions (Signed)

## 2023-10-01 NOTE — MAU Note (Signed)
.  Suzanne Lane is a 38 y.o. at [redacted]w[redacted]d here in MAU reporting: has been sick for about a week with headache, sinus congestion, sore throat, productive cough with yellow sputum, and lateral chest pain with coughing. Daughter was sick last Thursday-Friday, but no one tested for anything. Reports cold sweats last night, but denies running a fever. Denies VB or LOF.   Took children's tylenol yesterday and got a slight amount of relief - has not taken any additional medications.   LMP: N/A Onset of complaint: week ago Pain score: headache 2/10; chest pain 4/10 Vitals:   10/01/23 1313  BP: 115/65  Pulse: 85  Resp: 16  Temp: 98 F (36.7 C)  SpO2: 99%     FHT: 128  Lab orders placed from triage: Resp Swab

## 2023-10-01 NOTE — MAU Provider Note (Signed)
 MAU Provider Note  Chief Complaint: Headache, Cough, Chest Pain, and Sore Throat   Event Date/Time   First Provider Initiated Contact with Patient 10/01/23 1320     SUBJECTIVE HPI: Suzanne Lane is a 38 y.o. G4P1021 at [redacted]w[redacted]d by early ultrasound who presents to maternity admissions reporting sick for a week with headache, sinus congestion, sore throat, productive cough (yellow sputum) and lateral chest pain with coughing. Associated cold sweats. Daughter was sick last week, untested.   Tried Children's tylenol without relief.   Receives [facility].  She is currently monitored for the following issues for this high-risk pregnancy and has History of gestational diabetes mellitus (GDM); BMI 36.0-36.9,adult; Supervision of high risk pregnancy, antepartum; AMA (advanced maternal age) multigravida 35+, first trimester; Obesity in pregnancy; and History of small bowel obstruction on their problem list.    HPI  Past Medical History:  Diagnosis Date   Chronic kidney disease    Complicated UTI (urinary tract infection) 02/21/2014   Gestational diabetes 2015   diet controlled   Pyelonephritis affecting pregnancy in third trimester 02/20/2014   IMO SNOMED Dx Update Oct 2024     Small bowel obstruction (HCC)    As infant with surgery; recent obstruction 2012   UTI (urinary tract infection) during pregnancy 09/28/2013   RX Keflex 09/28/13  01/16/14: EColi UTI     Past Surgical History:  Procedure Laterality Date   ABDOMINAL SURGERY     Surgery as infant   Social History   Socioeconomic History   Marital status: Single    Spouse name: Not on file   Number of children: 1   Years of education: Not on file   Highest education level: Some college, no degree  Occupational History   Not on file  Tobacco Use   Smoking status: Former    Current packs/day: 0.00    Average packs/day: 0.5 packs/day for 10.0 years (5.0 ttl pk-yrs)    Types: Cigarettes    Start date: 08/12/2003    Quit  date: 08/11/2013    Years since quitting: 10.1   Smokeless tobacco: Never  Vaping Use   Vaping status: Never Used  Substance and Sexual Activity   Alcohol use: Not Currently   Drug use: No   Sexual activity: Not Currently  Other Topics Concern   Not on file  Social History Narrative   Not on file   Social Drivers of Health   Financial Resource Strain: Low Risk  (07/06/2023)   Overall Financial Resource Strain (CARDIA)    Difficulty of Paying Living Expenses: Not very hard  Food Insecurity: No Food Insecurity (07/06/2023)   Hunger Vital Sign    Worried About Running Out of Food in the Last Year: Never true    Ran Out of Food in the Last Year: Never true  Transportation Needs: Unmet Transportation Needs (07/06/2023)   PRAPARE - Administrator, Civil Service (Medical): Yes    Lack of Transportation (Non-Medical): No  Physical Activity: Insufficiently Active (07/06/2023)   Exercise Vital Sign    Days of Exercise per Week: 3 days    Minutes of Exercise per Session: 10 min  Stress: No Stress Concern Present (07/06/2023)   [employer] of Occupational Health - Occupational Stress Questionnaire    Feeling of Stress : Not at all  Social Connections: Moderately Isolated (07/06/2023)   Social Connection and Isolation Panel [NHANES]    Frequency of Communication with Friends and Family: Once a week  Frequency of Social Gatherings with Friends and Family: Three times a week    Attends Religious Services: Never    Active Member of Clubs or Organizations: No    Attends Engineer, structural: Not on file    Marital Status: Living with partner  Intimate Partner Violence: Not on file   No current facility-administered medications on file prior to encounter.   Current Outpatient Medications on File Prior to Encounter  Medication Sig Dispense Refill   aspirin EC 81 MG tablet Take 1 tablet (81 mg total) by mouth daily. 60 tablet 1   prenatal vitamin w/FE, FA  (PRENATAL 1 + 1) 27-1 MG TABS tablet Take 1 tablet by mouth daily. 30 tablet 12   clobetasol ointment (TEMOVATE) 0.05 % Apply 1 Application topically 2 (two) times daily. 30 g 0   docusate calcium (SURFAK) 240 MG capsule Take 1 capsule (240 mg total) by mouth daily. 10 capsule 0   DOCUSATE SODIUM PO Take by mouth as needed.     Allergies  Allergen Reactions   Penicillins Hives    Can take cephalosporins- no reaction    ROS:  Pertinent positives/negatives listed above.  I have reviewed patient's Past Medical Hx, Surgical Hx, Family Hx, Social Hx, medications and allergies.   Physical Exam  Patient Vitals for the past 24 hrs:  BP Temp Temp src Pulse Resp SpO2 Height Weight  10/01/23 1313 115/65 98 F (36.7 C) Oral 85 16 99 % 5\' 2"  (1.575 m) 91.6 kg   Constitutional: Well-developed, well-nourished female in no acute distress  Cardiovascular: RRR, no RMG Respiratory: normal effort, CTAB GI: Abd soft, non-tender, gravid MS: Extremities nontender, no edema, normal ROM Neurologic: Alert and oriented x 4  GU: Neg CVAT.  FHT: 128  LAB RESULTS Results for orders placed or performed during the hospital encounter of 10/01/23 (from the past 24 hours)  Resp panel by RT-PCR (RSV, Flu A&B, Covid) Anterior Nasal Swab     Status: Abnormal   Collection Time: 10/01/23  1:16 PM   Specimen: Anterior Nasal Swab  Result Value Ref Range   SARS Coronavirus 2 by RT PCR POSITIVE (A) NEGATIVE   Influenza A by PCR NEGATIVE NEGATIVE   Influenza B by PCR NEGATIVE NEGATIVE   Resp Syncytial Virus by PCR NEGATIVE NEGATIVE  Group A Strep by PCR     Status: None   Collection Time: 10/01/23  1:47 PM   Specimen: Throat; Sterile Swab  Result Value Ref Range   Group A Strep by PCR NOT DETECTED NOT DETECTED    O/Positive/-- (12/17 1722)  IMAGING DG Chest 2 View Result Date: 10/01/2023 CLINICAL DATA:  Cough. EXAM: CHEST - 2 VIEW COMPARISON:  Chest radiograph dated 05/31/2018. FINDINGS: The heart size and  mediastinal contours are within normal limits. Slightly increased density at the medial right lung base. The left lung appears clear. No pleural effusion or pneumothorax. No acute osseous abnormality. IMPRESSION: Streaky opacities at the right lung base could represent infiltrate or atelectasis. Recommend short-term follow-up. Electronically Signed   By: Hart Robinsons M.D.   On: 10/01/2023 14:41   [place] MFM OB DETAIL +14 WK Result Date: 09/18/2023 ----------------------------------------------------------------------  OBSTETRICS REPORT                       (Signed Final 09/18/2023 04:54 pm) ---------------------------------------------------------------------- Patient Info  ID #:       [id number]  D.O.B.:  [birth year] (37 yrs)(F)  Name:       Suzanne Lane                 Visit Date: 09/18/2023 02:41 pm ---------------------------------------------------------------------- Performed By  Attending:        Noralee Space MD        Ref. Address:     [address]                                                             [place], [place]                                                             [address]  Performed By:     Marcellina Millin       [facility]  Referred By:      Spreckels Bing MD ---------------------------------------------------------------------- Orders  #  Description                           Code        Ordered By  1  [place] MFM OB DETAIL +14 WK               62130.86     Bing ----------------------------------------------------------------------  #  Order #                     Accession #                Episode #  1  [id number]                   [id number]                 [id number]  ---------------------------------------------------------------------- Indications  Advanced maternal age multigravida 35+,  W29.562  second trimester (37 yrs)  Obesity complicating pregnancy, second         O99.212  trimester (BMI 37)  Poor obstetric history: Previous gestational   O09.299  diabetes  Encounter for antenatal screening for          Z36.3  malformations  [redacted] weeks gestation of pregnancy                Z3A.19  LR NIPS - Female, Negative Horizon, Negative  AFP ---------------------------------------------------------------------- Vital Signs  BP:          116/66 ---------------------------------------------------------------------- Fetal Evaluation  Num Of Fetuses:         1  Fetal Heart Rate(bpm):  143  Cardiac Activity:       Observed  Presentation:           Variable  Placenta:               Posterior  P. Cord Insertion:      Not well visualized  Amniotic Fluid  AFI FV:      Within normal limits                              Largest Pocket(cm)                              5.64 ---------------------------------------------------------------------- Biometry  BPD:        45  mm     G. Age:  19w 4d         64  %    CI:        72.13   %    70 - 86                                                          FL/HC:      17.5   %    16.1 - 18.3  HC:      168.6  mm     G. Age:  19w 4d         53  %    HC/AC:      1.13        1.09 - 1.39  AC:      149.2  mm     G. Age:  20w 1d         74  %    FL/BPD:     65.6   %  FL:       29.5  mm     G. Age:  19w 1d         36  %    FL/AC:      19.8   %    20 - 24  CER:        20  mm     G. Age:  19w 2d         50  %  NFT:       5.1  mm  LV:        4.7  mm  CM:        5.1  mm  Est. FW:     305  gm    0  lb 11 oz      68  % ---------------------------------------------------------------------- OB History  Gravidity:    4         Term:   1        Prem:   0        SAB:   2  TOP:          0       Ectopic:  0        Living: 1  ---------------------------------------------------------------------- Gestational Age  LMP:           19w 2d        Date:  05/06/23                  EDD:   02/10/24  U/S Today:     19w 4d                                        EDD:   02/08/24  Best:          19w 2d     Det. By:  LMP  (05/06/23)          EDD:   02/10/24 ---------------------------------------------------------------------- Targeted Anatomy  Central Nervous System  Calvarium/Cranial V.:  Appears normal         Cereb./Vermis:          Appears normal  Cavum:                 Appears normal         Cisterna Magna:         Appears normal  Lateral Ventricles:    Appears normal         Midline Falx:           Appears normal  Choroid Plexus:        Appears normal  Spine  Cervical:              Appears normal         Sacral:                 Limited  Thoracic:              Limited                Shape/Curvature:        Appears normal  Lumbar:                Limited  Head/Neck  Lips:                  Appears normal         Profile:                Appears normal  Neck:                  Appears normal         Orbits/Eyes:            Appears normal  Nuchal Fold:           Appears normal         Mandible:               Appears normal  Nasal Bone:            Present  Maxilla:                Appears normal  Thorax  4 Chamber View:        Not well visualized    Interventr. Septum:     Not well visualized  Cardiac Rhythm:        Normal                 Cardiac Axis:           Normal  Cardiac Situs:         Appears normal         Diaphragm:              Appears normal  Rt Outflow Tract:      Appears normal         3 Vessel View:          Appears normal  Lt Outflow Tract:      Appears normal         3 V Trachea View:       Appears normal  Aortic Arch:           Not well visualized    IVC:                    Appears normal  Ductal Arch:           Appears normal         Crossing:               Not well visualized  SVC:                   Appears normal  Abdomen   Ventral Wall:          Appears normal         Lt Kidney:              Appears normal  Cord Insertion:        Appears normal         Rt Kidney:              Appears normal  Situs:                 Appears normal         Bladder:                Appears normal  Stomach:               Appears normal  Extremities  Lt Humerus:            Appears normal         Lt Femur:               Appears normal  Rt Humerus:            Appears normal         Rt Femur:               Appears normal  Lt Forearm:            Appears normal         Lt Lower Leg:           Appears normal  Rt Forearm:            Appears normal         Rt Lower Leg:           Appears normal  Lt  Hand:               Open hand nml          Lt Foot:                Nml heel/foot  Rt Hand:               Open hand nml          Rt Foot:                Nml heel/foot  Other  Umbilical Cord:        Normal 3-vessel        Genitalia:              Female-nml  Comment:     Technically difficult due to maternal habitus and fetal position. ---------------------------------------------------------------------- Cervix Uterus Adnexa  Cervix  Length:            4.3  cm.  Normal appearance by transabdominal scan  Uterus  No abnormality visualized.  Right Ovary  Not visualized.  Left Ovary  Not visualized.  Cul De Sac  No free fluid seen.  Adnexa  No adnexal mass visualized ---------------------------------------------------------------------- Impression  We performed a fetal anatomical survey.  Amniotic fluid is  normal good fetal activity seen.  Fetal biometry is consistent  with the previously established dates.  No markers of  aneuploidies or obvious fetal structural defects are seen.  Patient understands the limitations of ultrasound in detecting  fetal anomalies.  As maternal obesity imposes limitations on the resolution of  images, fetal anomalies may be missed.  xxxxxxxxxxxxxxxxxxxxxxxxxxxxxxxxxxxxxxxxxxxxxxxxxxxxxxxxx  xx  Maternal-Fetal Medicine Consultation  I had the  pleasure of seeing Ms. Suzanne Lane [facility]. She is G4 P1021 at 19w 2d  gestation and is here for fetal anatomy scan. She was  accompanied by her husband.  Advanced maternal age.  On cell-free fetal DNA screening,  the risks of fetal aneuploidies are not increased.  MSAFP  screening showed low risk for open neural tube defects.  Carrier screening showed that she does not have sickle cell  trait.  Her daughter has hemoglobin C trait.  Obstetric history is significant for a term vaginal delivery in  2015 of a female infant weighing 6 pounds 11 ounces at birth.  Advanced maternal age and a child with hemoglobin C trait.  I discussed the significance and limitations of cell free fetal  DNA screening that has a greater detection rate for Down  syndrome.  It does not, however, detect all chromosomal  anomalies.  Amniocentesis is an option.  I explained the  procedure and possible complication of miscarriage (1 and  500 procedures).  Patient opted not to have amniocentesis.  Daughter must of acquired hemoglobin C trait from her father  (same father).  Patient's carrier screening ruled out  hemoglobin C trait.  History of Gestational Diabetes  Patient had gestational diabetes and a previous pregnancy.  She is a high risk for recurrence of gestational diabetes.  I  discussed early screening.  Alternatively, screening may be  performed at 24 to [redacted] weeks gestation. ---------------------------------------------------------------------- Recommendations  -Screen for gestational diabetes at 51 to [redacted] weeks gestation.  -An appointment was made for her to return in 5 weeks for  completion of fetal anatomy. ----------------------------------------------------------------------                 Noralee Space, MD Electronically Signed Final  Report   09/18/2023 04:54 pm ----------------------------------------------------------------------    MAU Management/MDM: Orders Placed This Encounter   Procedures   Resp panel by RT-PCR (RSV, Flu A&B, Covid) Anterior Nasal Swab   Group A Strep by PCR   DG Chest 2 View   Discharge patient    Meds ordered this encounter  Medications   acetaminophen-caffeine (EXCEDRIN TENSION HEADACHE) 500-65 MG per tablet 2 tablet   cyclobenzaprine (FLEXERIL) tablet 10 mg   nirmatrelvir/ritonavir (PAXLOVID) 20 x 150 MG & 10 x 100MG  TABS    Sig: Take 3 tablets by mouth 2 (two) times daily for 5 days. Take nirmatrelvir (150 mg) two tablets twice daily for 5 days and ritonavir (100 mg) one tablet twice daily for 5 days.    Dispense:  30 tablet    Refill:  0     Available prenatal records reviewed.  CXR, Strep A swab, Respiratory swab.  Excedrin Tension  ASSESSMENT 1. [redacted] weeks gestation of pregnancy   2. COVID-19 affecting pregnancy in second trimester   Clinical picture consistent with COVID 19  - nirmatrelvir/ritonavir 20 x 150 MG & 10 x 100MG  Tabs - supportive care; safe medications list provided - masking discussed - work note provided, for return on 2/25  Streaky opacities at the right lung base could represent infiltrate or atelectasis.  - Recommend short-term follow-up per radiology - similar appearance to prior CXR from 2019 on my review   PLAN Discharge home with strict return precautions. Continue routine prenatal care as scheduled  Allergies as of 10/01/2023       Reactions   Penicillins Hives   Can take cephalosporins- no reaction        Medication List     TAKE these medications    aspirin EC 81 MG tablet Take 1 tablet (81 mg total) by mouth daily.   clobetasol ointment 0.05 % Commonly known as: TEMOVATE Apply 1 Application topically 2 (two) times daily.   docusate calcium 240 MG capsule Commonly known as: SURFAK Take 1 capsule (240 mg total) by mouth daily.   DOCUSATE SODIUM PO Take by mouth as needed.   nirmatrelvir/ritonavir 20 x 150 MG & 10 x 100MG  Tabs Commonly known as: PAXLOVID Take 3 tablets by  mouth 2 (two) times daily for 5 days. Take nirmatrelvir (150 mg) two tablets twice daily for 5 days and ritonavir (100 mg) one tablet twice daily for 5 days.   prenatal vitamin w/FE, FA 27-1 MG Tabs tablet Take 1 tablet by mouth daily.         Wyn Forster, MD FMOB Fellow, Faculty practice [facility], [facility]  10/01/2023  3:22 PM

## 2023-10-10 DIAGNOSIS — Z419 Encounter for procedure for purposes other than remedying health state, unspecified: Secondary | ICD-10-CM | POA: Diagnosis not present

## 2023-10-22 ENCOUNTER — Other Ambulatory Visit: Payer: Self-pay | Admitting: *Deleted

## 2023-10-22 ENCOUNTER — Ambulatory Visit: Attending: Obstetrics and Gynecology | Admitting: *Deleted

## 2023-10-22 ENCOUNTER — Other Ambulatory Visit: Payer: Self-pay

## 2023-10-22 ENCOUNTER — Ambulatory Visit (HOSPITAL_BASED_OUTPATIENT_CLINIC_OR_DEPARTMENT_OTHER)

## 2023-10-22 VITALS — BP 121/65 | HR 85

## 2023-10-22 DIAGNOSIS — O99212 Obesity complicating pregnancy, second trimester: Secondary | ICD-10-CM | POA: Diagnosis not present

## 2023-10-22 DIAGNOSIS — Z362 Encounter for other antenatal screening follow-up: Secondary | ICD-10-CM | POA: Diagnosis not present

## 2023-10-22 DIAGNOSIS — O09521 Supervision of elderly multigravida, first trimester: Secondary | ICD-10-CM

## 2023-10-22 DIAGNOSIS — Z363 Encounter for antenatal screening for malformations: Secondary | ICD-10-CM | POA: Diagnosis not present

## 2023-10-22 DIAGNOSIS — Z8719 Personal history of other diseases of the digestive system: Secondary | ICD-10-CM

## 2023-10-22 DIAGNOSIS — O09522 Supervision of elderly multigravida, second trimester: Secondary | ICD-10-CM | POA: Diagnosis not present

## 2023-10-22 DIAGNOSIS — Z8632 Personal history of gestational diabetes: Secondary | ICD-10-CM

## 2023-10-22 DIAGNOSIS — O09292 Supervision of pregnancy with other poor reproductive or obstetric history, second trimester: Secondary | ICD-10-CM | POA: Insufficient documentation

## 2023-10-22 DIAGNOSIS — Z3A24 24 weeks gestation of pregnancy: Secondary | ICD-10-CM | POA: Diagnosis not present

## 2023-10-22 DIAGNOSIS — E669 Obesity, unspecified: Secondary | ICD-10-CM | POA: Diagnosis not present

## 2023-10-22 DIAGNOSIS — O0990 Supervision of high risk pregnancy, unspecified, unspecified trimester: Secondary | ICD-10-CM

## 2023-10-22 DIAGNOSIS — O99210 Obesity complicating pregnancy, unspecified trimester: Secondary | ICD-10-CM

## 2023-10-23 ENCOUNTER — Ambulatory Visit: Payer: Medicaid Other

## 2023-10-28 ENCOUNTER — Ambulatory Visit: Payer: Self-pay | Admitting: Obstetrics & Gynecology

## 2023-10-28 ENCOUNTER — Other Ambulatory Visit: Payer: Self-pay

## 2023-10-28 ENCOUNTER — Other Ambulatory Visit: Payer: Medicaid Other

## 2023-10-28 ENCOUNTER — Encounter: Payer: Self-pay | Admitting: Obstetrics & Gynecology

## 2023-10-28 VITALS — BP 126/77 | HR 89 | Wt 204.1 lb

## 2023-10-28 DIAGNOSIS — O0992 Supervision of high risk pregnancy, unspecified, second trimester: Secondary | ICD-10-CM | POA: Diagnosis not present

## 2023-10-28 DIAGNOSIS — Z8632 Personal history of gestational diabetes: Secondary | ICD-10-CM | POA: Diagnosis not present

## 2023-10-28 DIAGNOSIS — Z3A24 24 weeks gestation of pregnancy: Secondary | ICD-10-CM

## 2023-10-28 DIAGNOSIS — O09522 Supervision of elderly multigravida, second trimester: Secondary | ICD-10-CM

## 2023-10-28 DIAGNOSIS — Z3A25 25 weeks gestation of pregnancy: Secondary | ICD-10-CM

## 2023-10-28 DIAGNOSIS — Z23 Encounter for immunization: Secondary | ICD-10-CM | POA: Diagnosis not present

## 2023-10-28 DIAGNOSIS — O0990 Supervision of high risk pregnancy, unspecified, unspecified trimester: Secondary | ICD-10-CM

## 2023-10-28 NOTE — Progress Notes (Signed)
 PRENATAL VISIT NOTE  Subjective:  Suzanne Lane is a 38 y.o. G4P1021 at [redacted]w[redacted]d being seen today for ongoing prenatal care.  She is currently monitored for the following issues for this high-risk pregnancy and has History of gestational diabetes mellitus (GDM); BMI 36.0-36.9,adult; Supervision of high risk pregnancy, antepartum; AMA (advanced maternal age) multigravida 35+; Obesity in pregnancy; and History of small bowel obstruction on their problem list.  Patient reports no complaints.  Contractions: Not present. Vag. Bleeding: None.  Movement: Present. Denies leaking of fluid.   The following portions of the patient's history were reviewed and updated as appropriate: allergies, current medications, past family history, past medical history, past social history, past surgical history and problem list.   Objective:   Vitals:   10/28/23 0820  BP: 126/77  Pulse: 89  Weight: 204 lb 1.6 oz (92.6 kg)    Fetal Status: Fetal Heart Rate (bpm): 151   Movement: Present     General:  Alert, oriented and cooperative. Patient is in no acute distress.  Skin: Skin is warm and dry. No rash noted.   Cardiovascular: Normal heart rate noted  Respiratory: Normal respiratory effort, no problems with respiration noted  Abdomen: Soft, gravid, appropriate for gestational age.  Pain/Pressure: Present     Pelvic: Cervical exam deferred        Extremities: Normal range of motion.  Edema: Trace  Mental Status: Normal mood and affect. Normal behavior. Normal judgment and thought content.   [place] M[facility] OB FOLLOW UP Result Date: 10/22/2023 ----------------------------------------------------------------------  OBSTETRICS REPORT                       (Signed Final 10/22/2023 10:39 am) ---------------------------------------------------------------------- Patient Info  ID #:       [id number]                          D.O.B.:  12/13/1985 (37 yrs)(F)  Name:       Suzanne Lane                 Visit Date: 10/22/2023 09:35 am  ---------------------------------------------------------------------- Performed By  Attending:        Ma Rings MD         Ref. Address:     [address]                                                             [place], [place]                                                             [address]  Performed By:     Tommie Raymond BS,       Location:         [facility], [facility]  Referred By:      [place] Bing MD ---------------------------------------------------------------------- Orders  #  Description                           Code        Ordered By  1  [place] MFM OB FOLLOW UP                   16109.60    Noralee Space ----------------------------------------------------------------------  #  Order #                     Accession #                Episode #  1  [id number]                   [id number]                 [id number] ---------------------------------------------------------------------- Indications  Advanced maternal age multigravida 20+,        O69.522  second trimester (37 yrs)  Obesity complicating pregnancy, second         O99.212  trimester (BMI 37)  [redacted] weeks gestation of pregnancy                Z3A.24  Poor obstetric history: Previous gestational   O09.299  diabetes  Encounter for antenatal screening for          Z36.3  malformations  LR NIPS - Female, Negative Horizon, Negative  AFP  Encounter for other antenatal screening        Z36.2  follow-up ---------------------------------------------------------------------- Fetal Evaluation  Num Of Fetuses:         1  Fetal Heart Rate(bpm):  121  Cardiac Activity:       Observed  Presentation:           Breech  Placenta:               Posterior  P. Cord Insertion:      Visualized  Amniotic Fluid  AFI FV:      Within normal limits                               Largest Pocket(cm)                              6.42 ---------------------------------------------------------------------- Biometry  BPD:      60.8  mm     G. Age:  24w 5d  66  %    CI:        74.96   %    70 - 86                                                          FL/HC:      19.3   %    18.7 - 20.9  HC:      222.8  mm     G. Age:  24w 2d         38  %    HC/AC:      1.15        1.05 - 1.21  AC:      193.6  mm     G. Age:  24w 0d         38  %    FL/BPD:     70.7   %    71 - 87  FL:         43  mm     G. Age:  24w 0d         35  %    FL/AC:      22.2   %    20 - 24  CER:        28  mm     G. Age:  24w 6d         79  %  LV:        4.2  mm  CM:        7.7  mm  Est. FW:     663  gm      1 lb 7 oz     40  % ---------------------------------------------------------------------- OB History  Gravidity:    4         Term:   1        Prem:   0        SAB:   2  TOP:          0       Ectopic:  0        Living: 1 ---------------------------------------------------------------------- Gestational Age  LMP:           24w 1d        Date:  05/06/23                  EDD:   02/10/24  U/S Today:     24w 2d                                        EDD:   02/09/24  Best:          24w 1d     Det. By:  LMP  (05/06/23)          EDD:   02/10/24 ---------------------------------------------------------------------- Targeted Anatomy  Central Nervous System  Calvarium/Cranial V.:  Appears normal         Cereb./Vermis:          Appears normal  Cavum:                 Appears normal         Cisterna  Magna:         Appears normal  Lateral Ventricles:    Appears normal         Midline Falx:           Appears normal  Choroid Plexus:        Appears normal  Spine  Cervical:              Appears normal         Sacral:                 Appears normal  Thoracic:              Appears normal         Shape/Curvature:        Appears normal  Lumbar:                Appears normal  Head/Neck  Lips:                  Appears normal          Profile:                Previously seen  Neck:                  Previously seen        Orbits/Eyes:            Previously seen  Nuchal Fold:           Previously seen        Mandible:               Previously seen  Nasal Bone:            Previously seen        Maxilla:                Previously seen  Thorax  4 Chamber View:        Appears normal         Interventr. Septum:     Not well visualized  Cardiac Rhythm:        Normal                 Cardiac Axis:           Normal  Cardiac Situs:         Appears normal         Diaphragm:              Appears normal  Rt Outflow Tract:      Appears normal         3 Vessel View:          Appears normal  Lt Outflow Tract:      Appears normal         3 V Trachea View:       Appears normal  Aortic Arch:           Appears normal         IVC:                    Appears normal  Ductal Arch:           Appears normal         Crossing:               Appears normal  SVC:  Appears normal  Abdomen  Ventral Wall:          Appears normal         Lt Kidney:              Appears normal  Cord Insertion:        Appears normal         Rt Kidney:              Appears normal  Situs:                 Appears normal         Bladder:                Appears normal  Stomach:               Appears normal  Extremities  Lt Humerus:            Previously seen        Lt Femur:               Previously seen  Rt Humerus:            Previously seen        Rt Femur:               Previously seen  Lt Forearm:            Previously seen        Lt Lower Leg:           Previously seen  Rt Forearm:            Previously seen        Rt Lower Leg:           Previously seen  Lt Hand:               Previously seen        Lt Foot:                Previously seen  Rt Hand:               Previously seen        Rt Foot:                Previously seen  Other  Umbilical Cord:        Normal 3-vessel        Genitalia:              Previously seen  Comment:     Technically difficult due to maternal habitus and  fetal position. ---------------------------------------------------------------------- Cervix Uterus Adnexa  Cervix  Length:            4.6  cm.  Normal appearance by transabdominal scan  Uterus  No abnormality visualized.  Right Ovary  Within normal limits.  Left Ovary  Within normal limits.  Cul De Sac  No free fluid seen.  Adnexa  No adnexal mass visualized ---------------------------------------------------------------------- Comments  This patient was seen for a follow up exam as the views of  the fetal anatomy were unable to be fully visualized during  her last exam.  Her pregnancy has been complicated by advanced maternal  age (38 years old) and maternal obesity with a BMI of 37.  She has a history of gestational diabetes in her prior  pregnancy.  She denies any problems since her last exam.  She was informed that the fetal growth and amniotic fluid  level appears  appropriate for her gestational age.  The views of the fetal anatomy were visualized today.  There  were no obvious anomalies noted.  The limitations of ultrasound in the detection of all anomalies  was discussed.  The patient will be screened for gestational diabetes next  week.  Due to obesity and advanced maternal age, a follow-up  growth scan was scheduled in 6 weeks. ----------------------------------------------------------------------                   Ma Rings, MD Electronically Signed Final Report   10/22/2023 10:39 am ----------------------------------------------------------------------   DG Chest 2 View Result Date: 10/01/2023 CLINICAL DATA:  Cough. EXAM: CHEST - 2 VIEW COMPARISON:  Chest radiograph dated 05/31/2018. FINDINGS: The heart size and mediastinal contours are within normal limits. Slightly increased density at the medial right lung base. The left lung appears clear. No pleural effusion or pneumothorax. No acute osseous abnormality. IMPRESSION: Streaky opacities at the right lung base could represent infiltrate or  atelectasis. Recommend short-term follow-up. Electronically Signed   By: Hart Robinsons M.D.   On: 10/01/2023 14:41    Assessment and Plan:  Pregnancy: G4P1021 at [redacted]w[redacted]d 1. Multigravida of advanced maternal age in second trimester (Primary) LR NIPS.  Next growth scan scheduled.  2. History of gestational diabetes mellitus (GDM) GTT being done today, will follow up results and manage accordingly.  3. [redacted] weeks gestation of pregnancy 4. Supervision of high risk pregnancy, antepartum GTT and lobs done today, will follow up results and manage accordingly. Tdap given today. Preterm labor symptoms and general obstetric precautions including but not limited to vaginal bleeding, contractions, leaking of fluid and fetal movement were reviewed in detail with the patient. Please refer to After Visit Summary for other counseling recommendations.   Return in about 3 weeks (around 11/18/2023) for OFFICE OB VISIT (MD only).  Future Appointments  Date Time Provider Department Center  11/30/2023 10:20 AM Arnette Felts, FNP [facility] None  12/03/2023  8:15 AM [facility] NURSE [facility]  12/03/2023  8:30 AM [facility] US4 [facility]    Jaynie Collins, MD

## 2023-10-28 NOTE — Patient Instructions (Signed)

## 2023-10-29 ENCOUNTER — Encounter: Payer: Self-pay | Admitting: Obstetrics & Gynecology

## 2023-10-29 ENCOUNTER — Other Ambulatory Visit: Payer: Self-pay | Admitting: Obstetrics & Gynecology

## 2023-10-29 ENCOUNTER — Encounter: Payer: Self-pay | Admitting: *Deleted

## 2023-10-29 DIAGNOSIS — O24419 Gestational diabetes mellitus in pregnancy, unspecified control: Secondary | ICD-10-CM | POA: Insufficient documentation

## 2023-10-29 LAB — HIV ANTIBODY (ROUTINE TESTING W REFLEX): HIV Screen 4th Generation wRfx: NONREACTIVE

## 2023-10-29 LAB — CBC
Hematocrit: 40.4 % (ref 34.0–46.6)
Hemoglobin: 13.2 g/dL (ref 11.1–15.9)
MCH: 28.3 pg (ref 26.6–33.0)
MCHC: 32.7 g/dL (ref 31.5–35.7)
MCV: 87 fL (ref 79–97)
Platelets: 307 10*3/uL (ref 150–450)
RBC: 4.66 x10E6/uL (ref 3.77–5.28)
RDW: 13.1 % (ref 11.7–15.4)
WBC: 11.5 10*3/uL — ABNORMAL HIGH (ref 3.4–10.8)

## 2023-10-29 LAB — GLUCOSE TOLERANCE, 2 HOURS W/ 1HR
Glucose, 1 hour: 170 mg/dL (ref 70–179)
Glucose, 2 hour: 112 mg/dL (ref 70–152)
Glucose, Fasting: 101 mg/dL — ABNORMAL HIGH (ref 70–91)

## 2023-10-29 LAB — RPR: RPR Ser Ql: NONREACTIVE

## 2023-10-29 MED ORDER — ACCU-CHEK GUIDE TEST VI STRP
1.0000 | ORAL_STRIP | Freq: Four times a day (QID) | 12 refills | Status: DC
Start: 2023-10-29 — End: 2024-01-06

## 2023-10-29 MED ORDER — ACCU-CHEK GUIDE W/DEVICE KIT: 1.0000 | PACK | Freq: Four times a day (QID) | 0 refills | Status: DC

## 2023-10-29 MED ORDER — ACCU-CHEK SOFTCLIX LANCETS MISC: 12 refills | Status: DC

## 2023-11-02 ENCOUNTER — Other Ambulatory Visit: Payer: Self-pay

## 2023-11-02 DIAGNOSIS — O24419 Gestational diabetes mellitus in pregnancy, unspecified control: Secondary | ICD-10-CM

## 2023-11-04 ENCOUNTER — Ambulatory Visit

## 2023-11-05 ENCOUNTER — Encounter: Attending: Obstetrics & Gynecology | Admitting: Dietician

## 2023-11-05 ENCOUNTER — Other Ambulatory Visit: Payer: Self-pay

## 2023-11-05 ENCOUNTER — Ambulatory Visit

## 2023-11-05 DIAGNOSIS — O24419 Gestational diabetes mellitus in pregnancy, unspecified control: Secondary | ICD-10-CM | POA: Diagnosis present

## 2023-11-05 DIAGNOSIS — Z3A26 26 weeks gestation of pregnancy: Secondary | ICD-10-CM | POA: Insufficient documentation

## 2023-11-05 DIAGNOSIS — Z713 Dietary counseling and surveillance: Secondary | ICD-10-CM | POA: Diagnosis not present

## 2023-11-05 NOTE — Progress Notes (Signed)
 Patient was seen for Gestational Diabetes on 11/05/2023  Start time 1523 and End time 1605   Estimated due date: 02/10/2024; [redacted]w[redacted]d  Clinical: Medications:  Current Outpatient Medications:    Accu-Chek Softclix Lancets lancets, Use as instructed, Disp: 100 each, Rfl: 12   aspirin EC 81 MG tablet, Take 1 tablet (81 mg total) by mouth daily., Disp: 60 tablet, Rfl: 1   Blood Glucose Monitoring Suppl (ACCU-CHEK GUIDE) w/Device KIT, 1 Device by Does not apply route 4 (four) times daily., Disp: 1 kit, Rfl: 0   clobetasol ointment (TEMOVATE) 0.05 %, Apply 1 Application topically 2 (two) times daily., Disp: 30 g, Rfl: 0   DOCUSATE SODIUM PO, Take by mouth as needed., Disp: , Rfl:    glucose blood (ACCU-CHEK GUIDE TEST) test strip, 1 each by Other route 4 (four) times daily. Use as instructed, Disp: 100 each, Rfl: 12   prenatal vitamin w/FE, FA (PRENATAL 1 + 1) 27-1 MG TABS tablet, Take 1 tablet by mouth daily., Disp: 30 tablet, Rfl: 12   docusate calcium (SURFAK) 240 MG capsule, Take 1 capsule (240 mg total) by mouth daily. (Patient not taking: Reported on 11/05/2023), Disp: 10 capsule, Rfl: 0  Medical History:  Current Outpatient Medications:    Accu-Chek Softclix Lancets lancets, Use as instructed, Disp: 100 each, Rfl: 12   aspirin EC 81 MG tablet, Take 1 tablet (81 mg total) by mouth daily., Disp: 60 tablet, Rfl: 1   Blood Glucose Monitoring Suppl (ACCU-CHEK GUIDE) w/Device KIT, 1 Device by Does not apply route 4 (four) times daily., Disp: 1 kit, Rfl: 0   clobetasol ointment (TEMOVATE) 0.05 %, Apply 1 Application topically 2 (two) times daily., Disp: 30 g, Rfl: 0   DOCUSATE SODIUM PO, Take by mouth as needed., Disp: , Rfl:    glucose blood (ACCU-CHEK GUIDE TEST) test strip, 1 each by Other route 4 (four) times daily. Use as instructed, Disp: 100 each, Rfl: 12   prenatal vitamin w/FE, FA (PRENATAL 1 + 1) 27-1 MG TABS tablet, Take 1 tablet by mouth daily., Disp: 30 tablet, Rfl: 12   docusate calcium  (SURFAK) 240 MG capsule, Take 1 capsule (240 mg total) by mouth daily. (Patient not taking: Reported on 11/05/2023), Disp: 10 capsule, Rfl: 0    Labs: OGTT fasting 101, 1 hour 170, 2 hour 112 on 10/28/2023 Lab Results  Component Value Date   HGBA1C 5.6 07/28/2023    Dietary and Lifestyle History: Pt present today alone with meter, lancing device strips and a sample bag of lancets. Pt reports taking a daily probiotic. Pt reports working part time most sitting. Pt reports she does the cooking and shopping and lives with significant other and 3 yo daughter. Pt reports previous GDM controlled with diet and exercise. Pt reports she uses nutrition labels to identify carbohydrate and protein grams. All Pt's questions were answered during this encounter.    Physical Activity: walks 5 days weekly averaging 5-6 K steps Stress: 4 out of 10/self care includes: lay down, watch tv, read, sleep Sleep: good  24 hr Recall:  First Meal:  egg salad with 2 honey wheat toast, 1 cup apple plum juice Snack:  cheese, peanuts, raisins Second meal: 6 breaded chicken wings, yogurt bar, water Snack:  salad with dressing  Third meal: 2 gordon's fish fillet, air fired zucchini Snack: 1% milk lactaid  Beverages:  water, flavored water, juice  NUTRITION INTERVENTION  Nutrition education (E-1) on the following topics:   Initial Follow-up  [x]  []   Definition of Gestational Diabetes [x]  []  Why dietary management is important in controlling blood glucose [x]  []  Effects each nutrient has on blood glucose levels [x]  []  Simple carbohydrates vs complex carbohydrates [x]  []  Fluid intake [x]  []  Creating a balanced meal plan [x]  []  Carbohydrate counting  [x]  []  When to check blood glucose levels [x]  []  Proper blood glucose monitoring techniques [x]  []  Effect of stress and stress reduction techniques  [x]  []  Exercise effect on blood glucose levels, appropriate exercise during pregnancy [x]  []  Importance of limiting  caffeine and abstaining from alcohol and smoking [x]  []  Medications used for blood sugar control during pregnancy [x]  []  Hypoglycemia and rule of 15 [x]  []  Postpartum self care  Patient has a meter prior to visit. Patient is instructed to begin testing pre breakfast and 2 hours after each meal. CBG: 93 mg/dL, reported as pre meal per Pt  Patient instructed to monitor glucose levels: QID FBS: 60 - <= 95 mg/dL; 2 hour: <= 322 mg/dL  Patient received handouts: Nutrition Diabetes and Pregnancy Carbohydrate Counting List Blood glucose log Snack ideas for diabetes during pregnancy  Patient will be seen for follow-up as needed.

## 2023-11-18 ENCOUNTER — Other Ambulatory Visit: Payer: Self-pay

## 2023-11-18 ENCOUNTER — Ambulatory Visit: Admitting: Obstetrics and Gynecology

## 2023-11-18 VITALS — BP 112/76 | HR 83 | Wt 205.7 lb

## 2023-11-18 DIAGNOSIS — O09523 Supervision of elderly multigravida, third trimester: Secondary | ICD-10-CM | POA: Diagnosis not present

## 2023-11-18 DIAGNOSIS — O99213 Obesity complicating pregnancy, third trimester: Secondary | ICD-10-CM | POA: Diagnosis not present

## 2023-11-18 DIAGNOSIS — O24410 Gestational diabetes mellitus in pregnancy, diet controlled: Secondary | ICD-10-CM

## 2023-11-18 DIAGNOSIS — O99210 Obesity complicating pregnancy, unspecified trimester: Secondary | ICD-10-CM

## 2023-11-18 DIAGNOSIS — Z3A28 28 weeks gestation of pregnancy: Secondary | ICD-10-CM

## 2023-11-18 DIAGNOSIS — O0993 Supervision of high risk pregnancy, unspecified, third trimester: Secondary | ICD-10-CM

## 2023-11-18 DIAGNOSIS — O0990 Supervision of high risk pregnancy, unspecified, unspecified trimester: Secondary | ICD-10-CM

## 2023-11-18 NOTE — Progress Notes (Signed)
   PRENATAL VISIT NOTE  Subjective:  Suzanne Lane is a 38 y.o. G4P1021 at [redacted]w[redacted]d being seen today for ongoing prenatal care.  She is currently monitored for the following issues for this high-risk pregnancy and has History of gestational diabetes mellitus (GDM); BMI 36.0-36.9,adult; Supervision of high risk pregnancy, antepartum; AMA (advanced maternal age) multigravida 35+; Obesity in pregnancy; History of small bowel obstruction; and Gestational diabetes mellitus, antepartum on their problem list.  Patient doing well with no acute concerns today. She reports no complaints.  Contractions: Not present. Vag. Bleeding: None.  Movement: Present. Denies leaking of fluid.   The following portions of the patient's history were reviewed and updated as appropriate: allergies, current medications, past family history, past medical history, past social history, past surgical history and problem list. Problem list updated.  Objective:   Vitals:   11/18/23 0834  BP: 112/76  Pulse: 83  Weight: 205 lb 11.2 oz (93.3 kg)    Fetal Status: Fetal Heart Rate (bpm): 132 Fundal Height: 28 cm Movement: Present     General:  Alert, oriented and cooperative. Patient is in no acute distress.  Skin: Skin is warm and dry. No rash noted.   Cardiovascular: Normal heart rate noted  Respiratory: Normal respiratory effort, no problems with respiration noted  Abdomen: Soft, gravid, appropriate for gestational age.  Pain/Pressure: Present     Pelvic: Cervical exam deferred        Extremities: Normal range of motion.  Edema: Trace  Mental Status:  Normal mood and affect. Normal behavior. Normal judgment and thought content.   Assessment and Plan:  Pregnancy: G4P1021 at [redacted]w[redacted]d  1. [redacted] weeks gestation of pregnancy (Primary)   2. Diet controlled gestational diabetes mellitus (GDM), antepartum FBS: 94-102 PPBS: 84-151 most in range, 2 outliers  Most fasting BS elevated, offered metformin now vs diet modification,  recheck in 1 week, if still elevated, start dinner metformin  3. Supervision of high risk pregnancy, antepartum Continue routine prenatal care  4. Obesity in pregnancy   5. Multigravida of advanced maternal age in third trimester   Preterm labor symptoms and general obstetric precautions including but not limited to vaginal bleeding, contractions, leaking of fluid and fetal movement were reviewed in detail with the patient.  Please refer to After Visit Summary for other counseling recommendations.   Return in about 1 week (around 11/25/2023) for [facility], in person.   Mariel Aloe, MD Faculty Attending [facility]

## 2023-11-21 DIAGNOSIS — Z419 Encounter for procedure for purposes other than remedying health state, unspecified: Secondary | ICD-10-CM | POA: Diagnosis not present

## 2023-11-30 ENCOUNTER — Encounter: Payer: Self-pay | Admitting: Nurse Practitioner

## 2023-11-30 ENCOUNTER — Ambulatory Visit (INDEPENDENT_AMBULATORY_CARE_PROVIDER_SITE_OTHER): Payer: Medicaid Other | Admitting: Nurse Practitioner

## 2023-11-30 VITALS — BP 120/64 | HR 82 | Temp 98.5°F | Ht 62.0 in | Wt 210.0 lb

## 2023-11-30 DIAGNOSIS — O24410 Gestational diabetes mellitus in pregnancy, diet controlled: Secondary | ICD-10-CM | POA: Diagnosis not present

## 2023-11-30 DIAGNOSIS — Z0000 Encounter for general adult medical examination without abnormal findings: Secondary | ICD-10-CM

## 2023-11-30 DIAGNOSIS — Z13220 Encounter for screening for lipoid disorders: Secondary | ICD-10-CM | POA: Diagnosis not present

## 2023-11-30 DIAGNOSIS — Z13228 Encounter for screening for other metabolic disorders: Secondary | ICD-10-CM

## 2023-11-30 DIAGNOSIS — J302 Other seasonal allergic rhinitis: Secondary | ICD-10-CM | POA: Diagnosis not present

## 2023-11-30 DIAGNOSIS — Z3A29 29 weeks gestation of pregnancy: Secondary | ICD-10-CM | POA: Diagnosis not present

## 2023-11-30 NOTE — Assessment & Plan Note (Signed)
 Behavior modifications discussed and diet history reviewed.   Pt will continue to exercise regularly and modify diet with low GI, plant based foods and decrease intake of processed foods.  Recommend intake of daily multivitamin, Vitamin D, and calcium.  Recommend monthly self breast exams for preventive screenings, as well as recommend immunizations that include influenza, TDAP

## 2023-11-30 NOTE — Assessment & Plan Note (Signed)
 Continue f/u with OB

## 2023-11-30 NOTE — Assessment & Plan Note (Signed)
 She is encouraged to confirm with her OB however claritin is safe to use with pregnancy

## 2023-11-30 NOTE — Progress Notes (Signed)
 [name], CMA,acting as a Neurosurgeon for Suzanne Epley, FNP.,have documented all relevant documentation on the behalf of Suzanne Epley, FNP,as directed by  Suzanne Epley, FNP while in the presence of Suzanne Epley, FNP.  Subjective:    Patient ID: Suzanne Lane , female    DOB: 1986/03/01 , 38 y.o.   MRN: [id number]  Chief Complaint  Patient presents with   Annual Exam    HPI  Patient presents today for HM, Patient reports compliance with medication. Patient denies any chest pain, SOB, or headaches. Patient has no concerns today. She is [redacted] weeks pregnant. Seen OB few weeks ago. She has 2 appts next week at her OB.     Past Medical History:  Diagnosis Date   Allergy    Penicillin   Chronic kidney disease    Complicated UTI (urinary tract infection) 02/21/2014   Gestational diabetes 2015   diet controlled   Pyelonephritis affecting pregnancy in third trimester 02/20/2014   IMO SNOMED Dx Update Oct 2024     Small bowel obstruction [facility])    As infant with surgery; recent obstruction 2012   UTI (urinary tract infection) during pregnancy 09/28/2013   RX Keflex  09/28/13  01/16/14: EColi UTI       Family History  Problem Relation Age of Onset   Alcohol abuse Mother    Miscarriages / [place] Mother    Diabetes Father    Alcohol abuse Father    High blood pressure Father    Depression Father    Sickle cell trait Daughter    Cancer Maternal Aunt    Depression Maternal Aunt    Cancer Maternal Grandfather    Colon cancer Maternal Grandfather    Cancer Paternal Grandfather    Early death Paternal Grandfather    ADD / ADHD Brother    Alcohol abuse Brother    Depression Brother    Stomach cancer Neg Hx    Rectal cancer Neg Hx    Esophageal cancer Neg Hx      Current Outpatient Medications:    Accu-Chek Softclix Lancets lancets, Use as instructed, Disp: 100 each, Rfl: 12   aspirin  EC 81 MG tablet, Take 1 tablet (81 mg total) by mouth daily., Disp: 60 tablet, Rfl: 1   Blood  Glucose Monitoring Suppl (ACCU-CHEK GUIDE) w/Device KIT, 1 Device by Does not apply route 4 (four) times daily., Disp: 1 kit, Rfl: 0   clobetasol  ointment (TEMOVATE ) 0.05 %, Apply 1 Application topically 2 (two) times daily., Disp: 30 g, Rfl: 0   DOCUSATE SODIUM  PO, Take by mouth as needed., Disp: , Rfl:    glucose blood (ACCU-CHEK GUIDE TEST) test strip, 1 each by Other route 4 (four) times daily. Use as instructed, Disp: 100 each, Rfl: 12   prenatal vitamin w/FE, FA (PRENATAL 1 + 1) 27-1 MG TABS tablet, Take 1 tablet by mouth daily., Disp: 30 tablet, Rfl: 12   Allergies  Allergen Reactions   Penicillins Hives    Can take cephalosporins- no reaction      The patient states she uses  currently [redacted] weeks pregnant .  Patient's last menstrual period was 05/06/2023.  Negative for: breast discharge, breast lump(s), breast pain and breast self exam. Associated symptoms include abnormal vaginal bleeding. Pertinent negatives include abnormal bleeding (hematology), anxiety, decreased libido, depression, difficulty falling sleep, dyspareunia, history of infertility, nocturia, sexual dysfunction, sleep disturbances, urinary incontinence, urinary urgency, vaginal discharge and vaginal itching. Diet regular.The patient states her exercise level is minimal.  The patient's tobacco use is:  Social History   Tobacco Use  Smoking Status Former   Current packs/day: 0.00   Average packs/day: 0.5 packs/day for 15.0 years (7.5 ttl pk-yrs)   Types: Cigarettes   Start date: 08/12/2003   Quit date: 09/10/2022   Years since quitting: 1.2  Smokeless Tobacco Never  Tobacco Comments   Smoked 4-5 a day on and off for about 15 years but recently quit.   She has been exposed to passive smoke. The patient's alcohol use is:  Social History   Substance and Sexual Activity  Alcohol Use Not Currently   Alcohol/week: 3.0 standard drinks of alcohol   Types: 2 Glasses of wine, 1 Cans of beer per week    Review of  Systems  Constitutional: Negative.   HENT: Negative.    Eyes: Negative.   Respiratory: Negative.    Cardiovascular: Negative.   Gastrointestinal: Negative.   Endocrine: Negative.   Genitourinary: Negative.   Musculoskeletal: Negative.   Skin: Negative.   Allergic/Immunologic: Negative.   Neurological: Negative.   Hematological: Negative.   Psychiatric/Behavioral: Negative.       Today's Vitals   11/30/23 1024  BP: 120/64  Pulse: 82  Temp: 98.5 F (36.9 C)  TempSrc: Oral  Weight: 210 lb (95.3 kg)  Height: 5\' 2"  (1.575 m)  PainSc: 0-No pain   Body mass index is 38.41 kg/m.  Wt Readings from Last 3 Encounters:  11/30/23 210 lb (95.3 kg)  11/18/23 205 lb 11.2 oz (93.3 kg)  10/28/23 204 lb 1.6 oz (92.6 kg)     Objective:  Physical Exam Vitals and nursing note reviewed.  Constitutional:      General: She is not in acute distress.    Appearance: Normal appearance. She is well-developed. She is obese.  HENT:     Head: Normocephalic and atraumatic.     Right Ear: Hearing, tympanic membrane, ear canal and external ear normal. There is no impacted cerumen.     Left Ear: Hearing, tympanic membrane, ear canal and external ear normal. There is no impacted cerumen.     Nose: Nose normal.     Mouth/Throat:     Mouth: Mucous membranes are moist.  Eyes:     General: Lids are normal.     Extraocular Movements: Extraocular movements intact.     Conjunctiva/sclera: Conjunctivae normal.     Pupils: Pupils are equal, round, and reactive to light.     Funduscopic exam:    Right eye: No papilledema.        Left eye: No papilledema.  Neck:     Thyroid: No thyroid mass.     Vascular: No carotid bruit.  Cardiovascular:     Rate and Rhythm: Normal rate and regular rhythm.     Pulses: Normal pulses.     Heart sounds: Normal heart sounds. No murmur heard. Pulmonary:     Effort: Pulmonary effort is normal. No respiratory distress.     Breath sounds: Normal breath sounds. No  wheezing.  Chest:     Chest wall: No mass.  Breasts:    Tanner Score is 5.     Right: Normal. No mass or tenderness.     Left: Normal. No mass or tenderness.  Abdominal:     General: Abdomen is flat. Bowel sounds are normal. There is no distension.     Palpations: Abdomen is soft.     Tenderness: There is no abdominal tenderness.     Comments: rounded  Genitourinary:    Rectum: Guaiac result negative.  Musculoskeletal:        General: No swelling or tenderness. Normal range of motion.     Cervical back: Full passive range of motion without pain, normal range of motion and neck supple.     Right lower leg: No edema.     Left lower leg: No edema.  Lymphadenopathy:     Upper Body:     Right upper body: No supraclavicular, axillary or pectoral adenopathy.     Left upper body: No supraclavicular, axillary or pectoral adenopathy.  Skin:    General: Skin is warm and dry.     Capillary Refill: Capillary refill takes less than 2 seconds.  Neurological:     General: No focal deficit present.     Mental Status: She is alert and oriented to person, place, and time.     Cranial Nerves: No cranial nerve deficit.     Sensory: No sensory deficit.  Psychiatric:        Mood and Affect: Mood normal.        Behavior: Behavior normal.        Thought Content: Thought content normal.        Judgment: Judgment normal.     Assessment And Plan:     Encounter for annual health examination Assessment & Plan: Behavior modifications discussed and diet history reviewed.   Pt will continue to exercise regularly and modify diet with low GI, plant based foods and decrease intake of processed foods.  Recommend intake of daily multivitamin, Vitamin D, and calcium .  Recommend monthly self breast exams for preventive screenings, as well as recommend immunizations that include influenza, TDAP    Encounter for screening for metabolic disorder -     Hemoglobin A1c  Encounter for screening for lipid  disorder -     Lipid panel  [redacted] weeks gestation of pregnancy Assessment & Plan: Continue f/u with OB  Orders: -     CBC with Differential/Platelet  Diet controlled gestational diabetes mellitus (GDM), antepartum Assessment & Plan: Continue f/u with OB. Will check A1c today as well.   Orders: -     CMP14+EGFR -     Hemoglobin A1c  Seasonal allergies Assessment & Plan: She is encouraged to confirm with her OB however claritin is safe to use with pregnancy    Return for 1 year physical; 6 month f/u . Patient was given opportunity to ask questions. Patient verbalized understanding of the plan and was able to repeat key elements of the plan. All questions were answered to their satisfaction.   Suzanne Epley, FNP  I, Suzanne Epley, FNP, have reviewed all documentation for this visit. The documentation on 11/30/23 for the exam, diagnosis, procedures, and orders are all accurate and complete.

## 2023-11-30 NOTE — Patient Instructions (Signed)
 Health Maintenance  Topic Date Due   Flu Shot  03/11/2024   Pap with HPV screening  08/09/2028   DTaP/Tdap/Td vaccine (3 - Td or Tdap) 10/27/2033   Hepatitis C Screening  Completed   HIV Screening  Completed   HPV Vaccine  Aged Out   Meningitis B Vaccine  Aged Out   COVID-19 Vaccine  Discontinued

## 2023-11-30 NOTE — Assessment & Plan Note (Signed)
 Continue f/u with OB. Will check A1c today as well.

## 2023-12-01 ENCOUNTER — Encounter: Payer: Self-pay | Admitting: Nurse Practitioner

## 2023-12-01 LAB — CBC WITH DIFFERENTIAL/PLATELET
Basophils Absolute: 0 10*3/uL (ref 0.0–0.2)
Basos: 0 %
EOS (ABSOLUTE): 0.2 10*3/uL (ref 0.0–0.4)
Eos: 2 %
Hematocrit: 39.3 % (ref 34.0–46.6)
Hemoglobin: 12.6 g/dL (ref 11.1–15.9)
Immature Grans (Abs): 0.1 10*3/uL (ref 0.0–0.1)
Immature Granulocytes: 1 %
Lymphocytes Absolute: 1.8 10*3/uL (ref 0.7–3.1)
Lymphs: 16 %
MCH: 27.5 pg (ref 26.6–33.0)
MCHC: 32.1 g/dL (ref 31.5–35.7)
MCV: 86 fL (ref 79–97)
Monocytes Absolute: 0.5 10*3/uL (ref 0.1–0.9)
Monocytes: 5 %
Neutrophils Absolute: 8.5 10*3/uL — ABNORMAL HIGH (ref 1.4–7.0)
Neutrophils: 76 %
Platelets: 312 10*3/uL (ref 150–450)
RBC: 4.58 x10E6/uL (ref 3.77–5.28)
RDW: 12.6 % (ref 11.7–15.4)
WBC: 11.1 10*3/uL — ABNORMAL HIGH (ref 3.4–10.8)

## 2023-12-01 LAB — CMP14+EGFR
ALT: 10 IU/L (ref 0–32)
AST: 13 IU/L (ref 0–40)
Albumin: 3.3 g/dL — ABNORMAL LOW (ref 3.9–4.9)
Alkaline Phosphatase: 158 IU/L — ABNORMAL HIGH (ref 44–121)
BUN/Creatinine Ratio: 17 (ref 9–23)
BUN: 9 mg/dL (ref 6–20)
Bilirubin Total: 0.3 mg/dL (ref 0.0–1.2)
CO2: 20 mmol/L (ref 20–29)
Calcium: 9.4 mg/dL (ref 8.7–10.2)
Chloride: 104 mmol/L (ref 96–106)
Creatinine, Ser: 0.52 mg/dL — ABNORMAL LOW (ref 0.57–1.00)
Globulin, Total: 2.8 g/dL (ref 1.5–4.5)
Glucose: 109 mg/dL — ABNORMAL HIGH (ref 70–99)
Potassium: 4.5 mmol/L (ref 3.5–5.2)
Sodium: 137 mmol/L (ref 134–144)
Total Protein: 6.1 g/dL (ref 6.0–8.5)
eGFR: 122 mL/min/{1.73_m2} (ref >59)

## 2023-12-01 LAB — LIPID PANEL
Chol/HDL Ratio: 4.9 ratio — ABNORMAL HIGH (ref 0.0–4.4)
Cholesterol, Total: 242 mg/dL — ABNORMAL HIGH (ref 100–199)
HDL: 49 mg/dL (ref >39)
LDL Chol Calc (NIH): 151 mg/dL — ABNORMAL HIGH (ref 0–99)
Triglycerides: 233 mg/dL — ABNORMAL HIGH (ref 0–149)
VLDL Cholesterol Cal: 42 mg/dL — ABNORMAL HIGH (ref 5–40)

## 2023-12-01 LAB — HEMOGLOBIN A1C
Est. average glucose Bld gHb Est-mCnc: 114 mg/dL
Hgb A1c MFr Bld: 5.6 % (ref 4.8–5.6)

## 2023-12-02 ENCOUNTER — Other Ambulatory Visit: Payer: Self-pay

## 2023-12-02 ENCOUNTER — Ambulatory Visit: Admitting: Obstetrics and Gynecology

## 2023-12-02 VITALS — BP 113/68 | HR 87 | Wt 206.6 lb

## 2023-12-02 DIAGNOSIS — Z3A30 30 weeks gestation of pregnancy: Secondary | ICD-10-CM | POA: Diagnosis not present

## 2023-12-02 DIAGNOSIS — O0990 Supervision of high risk pregnancy, unspecified, unspecified trimester: Secondary | ICD-10-CM

## 2023-12-02 DIAGNOSIS — O99213 Obesity complicating pregnancy, third trimester: Secondary | ICD-10-CM | POA: Diagnosis not present

## 2023-12-02 DIAGNOSIS — O99210 Obesity complicating pregnancy, unspecified trimester: Secondary | ICD-10-CM

## 2023-12-02 DIAGNOSIS — O24410 Gestational diabetes mellitus in pregnancy, diet controlled: Secondary | ICD-10-CM

## 2023-12-02 DIAGNOSIS — Z8719 Personal history of other diseases of the digestive system: Secondary | ICD-10-CM

## 2023-12-02 DIAGNOSIS — O09523 Supervision of elderly multigravida, third trimester: Secondary | ICD-10-CM

## 2023-12-02 DIAGNOSIS — O0993 Supervision of high risk pregnancy, unspecified, third trimester: Secondary | ICD-10-CM | POA: Diagnosis not present

## 2023-12-02 NOTE — Progress Notes (Signed)
   PRENATAL VISIT NOTE  Subjective:  Suzanne Lane is a 38 y.o. G4P1021 at [redacted]w[redacted]d being seen today for ongoing prenatal care.  She is currently monitored for the following issues for this high-risk pregnancy and has History of gestational diabetes mellitus (GDM); BMI 36.0-36.9,adult; Supervision of high risk pregnancy, antepartum; AMA (advanced maternal age) multigravida 35+; Obesity in pregnancy; History of small bowel obstruction; Gestational diabetes mellitus, antepartum; Encounter for annual health examination; [redacted] weeks gestation of pregnancy; and Seasonal allergies on their problem list.  Patient doing well with no acute concerns today. She reports no complaints.   . Vag. Bleeding: None.  Movement: Present. Denies leaking of fluid.   The following portions of the patient's history were reviewed and updated as appropriate: allergies, current medications, past family history, past medical history, past social history, past surgical history and problem list. Problem list updated.  Objective:   Vitals:   12/02/23 0911  BP: 113/68  Pulse: 87  Weight: 206 lb 9.6 oz (93.7 kg)    Fetal Status: Fetal Heart Rate (bpm): 121   Movement: Present     General:  Alert, oriented and cooperative. Patient is in no acute distress.  Skin: Skin is warm and dry. No rash noted.   Cardiovascular: Normal heart rate noted  Respiratory: Normal respiratory effort, no problems with respiration noted  Abdomen: Soft, gravid, appropriate for gestational age.  Pain/Pressure: Present     Pelvic: Cervical exam deferred        Extremities: Normal range of motion.  Edema: Trace  Mental Status:  Normal mood and affect. Normal behavior. Normal judgment and thought content.   Assessment and Plan:  Pregnancy: G4P1021 at [redacted]w[redacted]d  1. [redacted] weeks gestation of pregnancy (Primary)   2. Diet controlled gestational diabetes mellitus (GDM), antepartum FBS: 88-100 PPBS: 83-140, most in range Growth scan on 12/03/23  3.  Supervision of high risk pregnancy, antepartum Continue routine prenatal care  4. Obesity in pregnancy   5. History of small bowel obstruction Pt had surgery as an infant. Large horizontal scar noted on abdomen  6. Multigravida of advanced maternal age in third trimester   Preterm labor symptoms and general obstetric precautions including but not limited to vaginal bleeding, contractions, leaking of fluid and fetal movement were reviewed in detail with the patient.  Please refer to After Visit Summary for other counseling recommendations.   Return in about 2 weeks (around 12/16/2023).   Avie Boeck, MD Faculty Attending Center for [facility]

## 2023-12-03 ENCOUNTER — Other Ambulatory Visit: Payer: Self-pay

## 2023-12-03 ENCOUNTER — Ambulatory Visit

## 2023-12-03 ENCOUNTER — Ambulatory Visit: Attending: Obstetrics

## 2023-12-03 ENCOUNTER — Ambulatory Visit: Admitting: Maternal & Fetal Medicine

## 2023-12-03 VITALS — BP 118/65 | HR 84

## 2023-12-03 DIAGNOSIS — Z3A30 30 weeks gestation of pregnancy: Secondary | ICD-10-CM

## 2023-12-03 DIAGNOSIS — O09523 Supervision of elderly multigravida, third trimester: Secondary | ICD-10-CM

## 2023-12-03 DIAGNOSIS — O0990 Supervision of high risk pregnancy, unspecified, unspecified trimester: Secondary | ICD-10-CM

## 2023-12-03 DIAGNOSIS — O24410 Gestational diabetes mellitus in pregnancy, diet controlled: Secondary | ICD-10-CM

## 2023-12-03 DIAGNOSIS — O99213 Obesity complicating pregnancy, third trimester: Secondary | ICD-10-CM

## 2023-12-03 DIAGNOSIS — Z8632 Personal history of gestational diabetes: Secondary | ICD-10-CM | POA: Diagnosis present

## 2023-12-03 DIAGNOSIS — O99210 Obesity complicating pregnancy, unspecified trimester: Secondary | ICD-10-CM

## 2023-12-03 DIAGNOSIS — O09521 Supervision of elderly multigravida, first trimester: Secondary | ICD-10-CM | POA: Insufficient documentation

## 2023-12-03 DIAGNOSIS — E669 Obesity, unspecified: Secondary | ICD-10-CM | POA: Diagnosis not present

## 2023-12-03 DIAGNOSIS — Z8719 Personal history of other diseases of the digestive system: Secondary | ICD-10-CM | POA: Insufficient documentation

## 2023-12-04 NOTE — Progress Notes (Signed)
 After review, MFM consult with provider is not indicated for today  Kristi Petties, MD 12/04/2023 11:19 AM  [facility]

## 2023-12-15 ENCOUNTER — Ambulatory Visit (INDEPENDENT_AMBULATORY_CARE_PROVIDER_SITE_OTHER): Admitting: Certified Nurse Midwife

## 2023-12-15 ENCOUNTER — Other Ambulatory Visit (HOSPITAL_COMMUNITY)
Admission: RE | Admit: 2023-12-15 | Discharge: 2023-12-15 | Disposition: A | Discharge status: Home / Self Care | Source: Ambulatory Visit | Attending: Certified Nurse Midwife | Admitting: Certified Nurse Midwife

## 2023-12-15 ENCOUNTER — Other Ambulatory Visit: Payer: Self-pay

## 2023-12-15 VITALS — BP 113/72 | HR 83

## 2023-12-15 DIAGNOSIS — Z3A31 31 weeks gestation of pregnancy: Secondary | ICD-10-CM | POA: Diagnosis not present

## 2023-12-15 DIAGNOSIS — O26893 Other specified pregnancy related conditions, third trimester: Secondary | ICD-10-CM | POA: Diagnosis not present

## 2023-12-15 DIAGNOSIS — O09523 Supervision of elderly multigravida, third trimester: Secondary | ICD-10-CM | POA: Diagnosis not present

## 2023-12-15 DIAGNOSIS — R82998 Other abnormal findings in urine: Secondary | ICD-10-CM | POA: Diagnosis not present

## 2023-12-15 DIAGNOSIS — O0993 Supervision of high risk pregnancy, unspecified, third trimester: Secondary | ICD-10-CM

## 2023-12-15 DIAGNOSIS — N898 Other specified noninflammatory disorders of vagina: Secondary | ICD-10-CM | POA: Insufficient documentation

## 2023-12-15 DIAGNOSIS — O24410 Gestational diabetes mellitus in pregnancy, diet controlled: Secondary | ICD-10-CM

## 2023-12-15 LAB — POCT URINALYSIS DIP (DEVICE)
Bilirubin Urine: NEGATIVE
Glucose, UA: 500 mg/dL — AB
Ketones, ur: NEGATIVE mg/dL
Nitrite: NEGATIVE
Protein, ur: 30 mg/dL — AB
Specific Gravity, Urine: 1.025 (ref 1.005–1.030)
Urobilinogen, UA: 0.2 mg/dL (ref 0.0–1.0)
pH: 7 (ref 5.0–8.0)

## 2023-12-15 MED ORDER — NITROFURANTOIN MONOHYD MACRO 100 MG PO CAPS: 100.0000 mg | ORAL_CAPSULE | Freq: Two times a day (BID) | ORAL | 0 refills | Status: DC

## 2023-12-15 NOTE — Progress Notes (Signed)
 Vagina swollen, for a couple of weeks now.lot of pressure when she urinates.

## 2023-12-15 NOTE — Progress Notes (Signed)
   PRENATAL VISIT NOTE  Subjective:  Suzanne Lane is a 38 y.o. G4P1021 at [redacted]w[redacted]d being seen today for ongoing prenatal care.  She is currently monitored for the following issues for this high-risk pregnancy and has History of gestational diabetes mellitus (GDM); BMI 36.0-36.9,adult; Supervision of high risk pregnancy, antepartum; AMA (advanced maternal age) multigravida 35+; Obesity in pregnancy; History of small bowel obstruction; Gestational diabetes mellitus, antepartum; Encounter for annual health examination; [redacted] weeks gestation of pregnancy; and Seasonal allergies on their problem list.  Patient reports backache after standing for long periods of time. Also has complaints of vaginal swelling and soreness. Worsened with urination. .   Suzanne Lane. Bleeding: None.  Movement: Present. Denies leaking of fluid.   The following portions of the patient's history were reviewed and updated as appropriate: allergies, current medications, past family history, past medical history, past social history, past surgical history and problem list.   Objective:   Vitals:   12/15/23 1449  BP: 113/72  Pulse: 83    Fetal Status: Fetal Heart Rate (bpm): 133 Fundal Height: 32 cm Movement: Present     General:  Alert, oriented and cooperative. Patient is in no acute distress.  Skin: Skin is warm and dry. No rash noted.   Cardiovascular: Normal heart rate noted  Respiratory: Normal respiratory effort, no problems with respiration noted  Abdomen: Soft, gravid, appropriate for gestational age.  Pain/Pressure: Present     Pelvic: Cervical exam deferred        Extremities: Normal range of motion.     Mental Status: Normal mood and affect. Normal behavior. Normal judgment and thought content.   Assessment and Plan:  Pregnancy: G4P1021 at [redacted]w[redacted]d 1. Supervision of high risk pregnancy in third trimester (Primary) - Patient overall doing well.  - Reports vigorous and frequent fetal movement.  - Culture, OB  Urine  2. Multigravida of advanced maternal age in third trimester 3. [redacted] weeks gestation of pregnancy - Transverse presentation via leopold's today in office.   4. GDM, class A1 - CBG 75% in normal range with 6 outliers. Patient reports hibachi and late night snacks - Recommended double veggies and decrease rice intake with hibachi and also encouraged to restrict meals after 10pm. Pt verbalized understanding   5. Vaginal discharge during pregnancy in third trimester  - Cervicovaginal ancillary only( [place])  6. Urine leukocytes - Concern for UTI versus yeast infection.  - Culture, OB Urine  Preterm labor symptoms and general obstetric precautions including but not limited to vaginal bleeding, contractions, leaking of fluid and fetal movement were reviewed in detail with the patient. Please refer to After Visit Summary for other counseling recommendations.   Return in about 2 weeks (around 12/29/2023) for HROB.  Future Appointments  Date Time Provider Department Center  12/31/2023  8:00 AM [facility] PROVIDER 1 [facility]  12/31/2023  8:30 AM [facility] US6 [facility]  01/28/2024  8:00 AM [facility] PROVIDER 1 [facility]  01/28/2024  8:30 AM [facility] US4 [facility]  06/01/2024 10:40 AM [name], FNP [facility] None  11/30/2024 10:20 AM [name], FNP [facility] None    [name] (Maurie Southern) Marlys Singh, MSN, CNM  Center for [facility]  12/15/2023 3:07 PM

## 2023-12-17 ENCOUNTER — Encounter: Payer: Self-pay | Admitting: Certified Nurse Midwife

## 2023-12-17 LAB — CERVICOVAGINAL ANCILLARY ONLY
Bacterial Vaginitis (gardnerella): POSITIVE — AB
Candida Glabrata: NEGATIVE
Candida Vaginitis: POSITIVE — AB
Chlamydia: NEGATIVE
Comment: NEGATIVE
Comment: NEGATIVE
Comment: NEGATIVE
Comment: NEGATIVE
Comment: NEGATIVE
Comment: NORMAL
Neisseria Gonorrhea: NEGATIVE
Trichomonas: NEGATIVE

## 2023-12-17 LAB — URINE CULTURE, OB REFLEX

## 2023-12-17 LAB — CULTURE, OB URINE

## 2023-12-17 MED ORDER — TERCONAZOLE 0.4 % VA CREA: 1.0000 | TOPICAL_CREAM | Freq: Every day | VAGINAL | 0 refills | Status: DC

## 2023-12-17 NOTE — Addendum Note (Signed)
 Addended by: Deyton Ellenbecker M on: 12/17/2023 02:18 PM   Modules accepted: Orders

## 2023-12-20 ENCOUNTER — Other Ambulatory Visit: Payer: Self-pay | Admitting: Obstetrics and Gynecology

## 2023-12-21 DIAGNOSIS — Z419 Encounter for procedure for purposes other than remedying health state, unspecified: Secondary | ICD-10-CM | POA: Diagnosis not present

## 2023-12-21 DIAGNOSIS — Z3483 Encounter for supervision of other normal pregnancy, third trimester: Secondary | ICD-10-CM | POA: Diagnosis not present

## 2023-12-21 DIAGNOSIS — Z3482 Encounter for supervision of other normal pregnancy, second trimester: Secondary | ICD-10-CM | POA: Diagnosis not present

## 2023-12-29 ENCOUNTER — Ambulatory Visit: Admitting: Certified Nurse Midwife

## 2023-12-29 ENCOUNTER — Encounter: Admitting: Certified Nurse Midwife

## 2023-12-29 ENCOUNTER — Other Ambulatory Visit: Payer: Self-pay

## 2023-12-29 VITALS — BP 114/76 | HR 96 | Wt 207.8 lb

## 2023-12-29 DIAGNOSIS — O24410 Gestational diabetes mellitus in pregnancy, diet controlled: Secondary | ICD-10-CM | POA: Diagnosis not present

## 2023-12-29 DIAGNOSIS — O09523 Supervision of elderly multigravida, third trimester: Secondary | ICD-10-CM

## 2023-12-29 DIAGNOSIS — O0993 Supervision of high risk pregnancy, unspecified, third trimester: Secondary | ICD-10-CM | POA: Diagnosis not present

## 2023-12-29 DIAGNOSIS — M25473 Effusion, unspecified ankle: Secondary | ICD-10-CM

## 2023-12-29 DIAGNOSIS — Z3A33 33 weeks gestation of pregnancy: Secondary | ICD-10-CM | POA: Diagnosis not present

## 2023-12-31 ENCOUNTER — Ambulatory Visit (HOSPITAL_BASED_OUTPATIENT_CLINIC_OR_DEPARTMENT_OTHER)

## 2023-12-31 ENCOUNTER — Ambulatory Visit: Attending: Maternal & Fetal Medicine | Admitting: Obstetrics

## 2023-12-31 VITALS — BP 116/69 | HR 80

## 2023-12-31 DIAGNOSIS — O24410 Gestational diabetes mellitus in pregnancy, diet controlled: Secondary | ICD-10-CM

## 2023-12-31 DIAGNOSIS — E669 Obesity, unspecified: Secondary | ICD-10-CM

## 2023-12-31 DIAGNOSIS — O0990 Supervision of high risk pregnancy, unspecified, unspecified trimester: Secondary | ICD-10-CM

## 2023-12-31 DIAGNOSIS — O99213 Obesity complicating pregnancy, third trimester: Secondary | ICD-10-CM | POA: Diagnosis not present

## 2023-12-31 DIAGNOSIS — Z3A34 34 weeks gestation of pregnancy: Secondary | ICD-10-CM | POA: Diagnosis not present

## 2023-12-31 DIAGNOSIS — O99210 Obesity complicating pregnancy, unspecified trimester: Secondary | ICD-10-CM

## 2023-12-31 DIAGNOSIS — Z362 Encounter for other antenatal screening follow-up: Secondary | ICD-10-CM | POA: Insufficient documentation

## 2023-12-31 DIAGNOSIS — O09523 Supervision of elderly multigravida, third trimester: Secondary | ICD-10-CM | POA: Diagnosis not present

## 2023-12-31 NOTE — Progress Notes (Signed)
   PRENATAL VISIT NOTE  Subjective:  Suzanne Lane is a 38 y.o. G4P1021 at [redacted]w[redacted]d being seen today for ongoing prenatal care.  She is currently monitored for the following issues for this high-risk pregnancy and has History of gestational diabetes mellitus (GDM); BMI 36.0-36.9,adult; Supervision of high risk pregnancy, antepartum; AMA (advanced maternal age) multigravida 35+; Obesity in pregnancy; History of small bowel obstruction; Gestational diabetes mellitus, antepartum; Encounter for annual health examination; [redacted] weeks gestation of pregnancy; and Seasonal allergies on their problem list.  Patient reports mild swelling at night. .  Contractions: Irritability. Vag. Bleeding: None.  Movement: Present. Denies leaking of fluid.   The following portions of the patient's history were reviewed and updated as appropriate: allergies, current medications, past family history, past medical history, past social history, past surgical history and problem list.   Objective:    Vitals:   12/29/23 1322  BP: 114/76  Pulse: 96  Weight: 207 lb 12.8 oz (94.3 kg)    Fetal Status:  Fetal Heart Rate (bpm): 136 Fundal Height: 34 cm Movement: Present    General: Alert, oriented and cooperative. Patient is in no acute distress.  Skin: Skin is warm and dry. No rash noted.   Cardiovascular: Normal heart rate noted  Respiratory: Normal respiratory effort, no problems with respiration noted  Abdomen: Soft, gravid, appropriate for gestational age.  Pain/Pressure: Present     Pelvic: Cervical exam deferred        Extremities: Normal range of motion.  Edema: Mild pitting, slight indentation  Mental Status: Normal mood and affect. Normal behavior. Normal judgment and thought content.   Assessment and Plan:  Pregnancy: G4P1021 at [redacted]w[redacted]d 1. Supervision of high risk pregnancy in third trimester (Primary) - Patient doing well.  - Reports vigorous and frequent fetal movement   2. [redacted] weeks gestation of pregnancy -  Fundal height appropriate for gestational age   72. Multigravida of advanced maternal age in third trimester - Tdap given today.   4. Diet controlled gestational diabetes mellitus (GDM) in third trimester - 6 outliers noted on glucose log today, but mostly within range.  - Growth US  scheduled for later this week.   5. Ankle swelling, unspecified laterality - Recommendation for Compression socks and elevation at night.   Preterm labor symptoms and general obstetric precautions including but not limited to vaginal bleeding, contractions, leaking of fluid and fetal movement were reviewed in detail with the patient. Please refer to After Visit Summary for other counseling recommendations.   Return in about 2 weeks (around 01/12/2024) for HROB.  Future Appointments  Date Time Provider Department Center  01/11/2024 11:15 AM Ferdie Housekeeper, MD [facility]  01/18/2024  1:15 PM Zelma Hidden, FNP [facility]  01/27/2024  4:15 PM Darrow End, MD [facility]  01/28/2024  8:00 AM [facility] PROVIDER 1 [facility]  01/28/2024  8:30 AM [facility] US4 [facility]  02/04/2024  2:35 PM Tresia Fruit, MD [facility]  06/01/2024 10:40 AM [name], FNP [facility] None  11/30/2024 10:20 AM [name], FNP [facility] None    Mohmed Farver Maurie Southern) Marlys Singh, MSN, CNM  Center for [facility]  12/31/2023 10:31 PM

## 2023-12-31 NOTE — Progress Notes (Signed)
 MFM Consult Note  Suzanne Lane is currently at 34 weeks and 1 day.  She has been followed due to diet-controlled gestational diabetes, maternal obesity with a BMI of 41, and advanced maternal age (38 years old).    She denies any problems since her last exam and reports that her fingerstick values have mostly been within normal limits.  On today's exam, the overall EFW of 5 pounds 2 ounces measures at the 39th percentile for her gestational age.    There was normal amniotic fluid noted with a total AFI of 22.7 cm.    The fetus was in the breech presentation today.    Fetal movements and fetal breathing movements were noted throughout today's ultrasound exam.    Due to gestational diabetes, delivery is recommended at around 39 weeks.  She will return in 4 weeks for another growth scan.    Weekly fetal testing is recommended should she require insulin or metformin for treatment of gestational diabetes.    The patient stated that all of her questions were answered today.  A total of 20 minutes was spent counseling and coordinating the care for this patient.  Greater than 50% of the time was spent in direct face-to-face contact.

## 2024-01-02 ENCOUNTER — Inpatient Hospital Stay (HOSPITAL_COMMUNITY)
Admission: AD | Admit: 2024-01-02 | Discharge: 2024-01-06 | DRG: 786 | Disposition: A | Discharge status: Home / Self Care | Attending: Obstetrics and Gynecology | Admitting: Obstetrics and Gynecology

## 2024-01-02 ENCOUNTER — Encounter (HOSPITAL_COMMUNITY): Payer: Self-pay | Admitting: Obstetrics and Gynecology

## 2024-01-02 ENCOUNTER — Other Ambulatory Visit: Payer: Self-pay

## 2024-01-02 DIAGNOSIS — Z7982 Long term (current) use of aspirin: Secondary | ICD-10-CM

## 2024-01-02 DIAGNOSIS — O24420 Gestational diabetes mellitus in childbirth, diet controlled: Secondary | ICD-10-CM | POA: Diagnosis present

## 2024-01-02 DIAGNOSIS — E66813 Obesity, class 3: Secondary | ICD-10-CM | POA: Diagnosis present

## 2024-01-02 DIAGNOSIS — O321XX0 Maternal care for breech presentation, not applicable or unspecified: Secondary | ICD-10-CM | POA: Diagnosis present

## 2024-01-02 DIAGNOSIS — Z98891 History of uterine scar from previous surgery: Principal | ICD-10-CM

## 2024-01-02 DIAGNOSIS — Z833 Family history of diabetes mellitus: Secondary | ICD-10-CM

## 2024-01-02 DIAGNOSIS — O4290 Premature rupture of membranes, unspecified as to length of time between rupture and onset of labor, unspecified weeks of gestation: Secondary | ICD-10-CM | POA: Diagnosis present

## 2024-01-02 DIAGNOSIS — O99214 Obesity complicating childbirth: Secondary | ICD-10-CM | POA: Diagnosis present

## 2024-01-02 DIAGNOSIS — Z880 Allergy status to penicillin: Secondary | ICD-10-CM

## 2024-01-02 DIAGNOSIS — O42913 Preterm premature rupture of membranes, unspecified as to length of time between rupture and onset of labor, third trimester: Principal | ICD-10-CM | POA: Diagnosis present

## 2024-01-02 DIAGNOSIS — Z3A34 34 weeks gestation of pregnancy: Secondary | ICD-10-CM

## 2024-01-02 DIAGNOSIS — Z87891 Personal history of nicotine dependence: Secondary | ICD-10-CM

## 2024-01-02 LAB — COMPREHENSIVE METABOLIC PANEL WITH GFR
ALT: 14 U/L (ref 0–44)
AST: 16 U/L (ref 15–41)
Albumin: 2.4 g/dL — ABNORMAL LOW (ref 3.5–5.0)
Alkaline Phosphatase: 347 U/L — ABNORMAL HIGH (ref 38–126)
Anion gap: 11 (ref 5–15)
BUN: 11 mg/dL (ref 6–20)
CO2: 19 mmol/L — ABNORMAL LOW (ref 22–32)
Calcium: 8.8 mg/dL — ABNORMAL LOW (ref 8.9–10.3)
Chloride: 106 mmol/L (ref 98–111)
Creatinine, Ser: 0.62 mg/dL (ref 0.44–1.00)
GFR, Estimated: >60 mL/min (ref >60)
Glucose, Bld: 95 mg/dL (ref 70–99)
Potassium: 3.7 mmol/L (ref 3.5–5.1)
Sodium: 136 mmol/L (ref 135–145)
Total Bilirubin: 0.7 mg/dL (ref 0.0–1.2)
Total Protein: 6.2 g/dL — ABNORMAL LOW (ref 6.5–8.1)

## 2024-01-02 LAB — RUPTURE OF MEMBRANE (ROM)PLUS: Rom Plus: POSITIVE

## 2024-01-02 LAB — CBC
HCT: 39.3 % (ref 36.0–46.0)
Hemoglobin: 12.8 g/dL (ref 12.0–15.0)
MCH: 26.8 pg (ref 26.0–34.0)
MCHC: 32.6 g/dL (ref 30.0–36.0)
MCV: 82.4 fL (ref 80.0–100.0)
Platelets: 289 10*3/uL (ref 150–400)
RBC: 4.77 MIL/uL (ref 3.87–5.11)
RDW: 13.8 % (ref 11.5–15.5)
WBC: 10.9 10*3/uL — ABNORMAL HIGH (ref 4.0–10.5)
nRBC: 0 % (ref 0.0–0.2)

## 2024-01-02 LAB — POCT FERN TEST: POCT Fern Test: POSITIVE

## 2024-01-02 MED ORDER — LACTATED RINGERS IV SOLN: INTRAVENOUS | Status: DC

## 2024-01-02 NOTE — MAU Note (Addendum)
 Suzanne Lane is a 38 y.o. at [redacted]w[redacted]d here in MAU reporting: a gush of fluid around 1930 while sitting on the couch and again about 10 minutes later. States that fluid was clear and milky. Has continued to trickle after the initial gush. Reports cramping that started after gush, but reports this has been ongoing intermittently for the last couple of weeks. Recently treated for UTI and finished ATB about a week ago, followed by yeast treatment completed this past Wednesday. Denies VB. Endorses good FM.  Onset of complaint: Today around 1930 Pain score: 2-3/10 lower abdomen There were no vitals filed for this visit.   FHT: 135  Lab orders placed from triage: urine and fern slide

## 2024-01-02 NOTE — H&P (Signed)
 LABOR ADMISSION HISTORY AND PHYSICAL  Suzanne Lane is a 38 y.o. female [phone] with IUP at [redacted]w[redacted]d by 8 week ultrasound presenting for leaking fluid.  Pt noted a gush of fluid a1930 and another gush about 10 minutes later.. She reports +FMs, no VB, no blurry vision, headaches or peripheral edema, and RUQ pain.  She plans on breast and bottle feeding, but notes breast feeding did not work well previously. She request nexplanon  for birth control.  Prenatal care complicated by AMA, and diet controlled GDM.  Dating: By 8w 2d --->  Estimated Date of Delivery: 02/10/24   Prenatal History/Complications:  Past Medical History: Past Medical History:  Diagnosis Date   Allergy    Penicillin   Chronic kidney disease    Complicated UTI (urinary tract infection) 02/21/2014   Gestational diabetes 2015   diet controlled   Pyelonephritis affecting pregnancy in third trimester 02/20/2014   IMO SNOMED Dx Update Oct 2024     Small bowel obstruction [facility])    As infant with surgery; recent obstruction 2012   UTI (urinary tract infection) during pregnancy 09/28/2013   RX Keflex  09/28/13  01/16/14: EColi UTI      Past Surgical History: Past Surgical History:  Procedure Laterality Date   ABDOMINAL SURGERY     Surgery as infant   SMALL INTESTINE SURGERY     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    Obstetrical History: OB History     Gravida  4   Para  1   Term  1   Preterm  0   AB  2   Living  1      SAB  2   IAB  0   Ectopic  0   Multiple  0   Live Births  1           Social History: Social History   Socioeconomic History   Marital status: Single    Spouse name: Not on file   Number of children: 1   Years of education: Not on file   Highest education level: Some college, no degree  Occupational History   Not on file  Tobacco Use   Smoking status: Former    Current packs/day: 0.00    Average packs/day: 0.5  packs/day for 15.0 years (7.5 ttl pk-yrs)    Types: Cigarettes    Start date: 08/12/2003    Quit date: 09/10/2022    Years since quitting: 1.3   Smokeless tobacco: Never   Tobacco comments:    Smoked 4-5 a day on and off for about 15 years but recently quit.  Vaping Use   Vaping status: Never Used  Substance and Sexual Activity   Alcohol use: Not Currently    Alcohol/week: 3.0 standard drinks of alcohol    Types: 2 Glasses of wine, 1 Cans of beer per week   Drug use: No   Sexual activity: Yes    Birth control/protection: None  Other Topics Concern   Not on file  Social History Narrative   Not on file   Social Drivers of Health   Financial Resource Strain: Low Risk  (11/26/2023)   Overall Financial Resource Strain (CARDIA)    Difficulty of Paying Living Expenses: Not hard at all  Food Insecurity: No Food Insecurity (11/26/2023)   Hunger Vital Sign    Worried About Running Out of Food in the Last Year: Never true    Ran Out of Food  in the Last Year: Never true  Transportation Needs: No Transportation Needs (11/26/2023)   PRAPARE - Administrator, Civil Service (Medical): No    Lack of Transportation (Non-Medical): No  Physical Activity: Insufficiently Active (11/26/2023)   Exercise Vital Sign    Days of Exercise per Week: 2 days    Minutes of Exercise per Session: 20 min  Stress: No Stress Concern Present (11/26/2023)   [employer] of Occupational Health - Occupational Stress Questionnaire    Feeling of Stress : Only a little  Social Connections: Unknown (11/26/2023)   Social Connection and Isolation Panel [NHANES]    Frequency of Communication with Friends and Family: Once a week    Frequency of Social Gatherings with Friends and Family: Three times a week    Attends Religious Services: Patient declined    Active Member of Clubs or Organizations: No    Attends Engineer, structural: Not on file    Marital Status: Living with partner    Family  History: Family History  Problem Relation Age of Onset   Alcohol abuse Mother    Miscarriages / [place] Mother    Diabetes Father    Alcohol abuse Father    High blood pressure Father    Depression Father    ADD / ADHD Brother    Alcohol abuse Brother    Depression Brother    Sickle cell trait Daughter    Cancer Maternal Aunt    Depression Maternal Aunt    Cancer Maternal Grandfather    Colon cancer Maternal Grandfather    Cancer Paternal Grandfather    Early death Paternal Grandfather    Stomach cancer Neg Hx    Rectal cancer Neg Hx    Esophageal cancer Neg Hx     Allergies: Allergies  Allergen Reactions   Penicillins Hives    Can take cephalosporins- no reaction    Medications Prior to Admission  Medication Sig Dispense Refill Last Dose/Taking   Accu-Chek Softclix Lancets lancets Use as instructed 100 each 12 01/02/2024   aspirin  EC 81 MG tablet Take 1 tablet (81 mg total) by mouth daily. 60 tablet 1 01/02/2024 Morning   Blood Glucose Monitoring Suppl (ACCU-CHEK GUIDE) w/Device KIT 1 Device by Does not apply route 4 (four) times daily. 1 kit 0 01/02/2024   DOCUSATE SODIUM  PO Take by mouth as needed.   Past Week   glucose blood (ACCU-CHEK GUIDE TEST) test strip 1 each by Other route 4 (four) times daily. Use as instructed 100 each 12 01/02/2024   nitrofurantoin , macrocrystal-monohydrate, (MACROBID ) 100 MG capsule Take 1 capsule (100 mg total) by mouth 2 (two) times daily. 14 capsule 0 Past Week   prenatal vitamin w/FE, FA (PRENATAL 1 + 1) 27-1 MG TABS tablet Take 1 tablet by mouth daily. 30 tablet 12 01/02/2024   terconazole  (TERAZOL 7 ) 0.4 % vaginal cream Place 1 applicator vaginally at bedtime. 45 g 0 Past Week   clobetasol  ointment (TEMOVATE ) 0.05 % Apply 1 Application topically 2 (two) times daily. (Patient not taking: Reported on 12/29/2023) 30 g 0      Review of Systems   All systems reviewed and negative except as stated in HPI  Blood pressure 128/75, pulse 74,  temperature 98.6 F (37 C), temperature source Oral, resp. rate 16, height 5\' 2"  (1.575 m), weight 94.8 kg, last menstrual period 05/06/2023, SpO2 97%. General appearance: alert, cooperative, and no distress Lungs: clear to auscultation bilaterally Heart: regular rate and rhythm Abdomen:  soft, non-tender; bowel sounds normal Pelvic: visually 1-2 Extremities: Homans sign is negative, no sign of DVT DTR's n/a Presentation: cephalic, MD viewed bedside ultrasound and position was confirmed Fetal monitoring Baseline: 120s bpm, Variability: moderate, Accelerations: present , category 1 strip, and Decelerations: Absent Uterine activity Frequency: Every 2-3 minutes     Prenatal labs: ABO, Rh: O/Positive/-- (12/17 1722) Antibody: Negative (12/17 1722) Rubella: 1.36 (12/17 1722) RPR: Non Reactive (03/19 0836)  HBsAg: Negative (12/17 1722)  HIV: Non Reactive (03/19 0836)  GBS:   unknown Failed 2 hour GTT Genetic screening : normal/low risk Anatomy US  : normal  Prenatal Transfer Tool  Maternal Diabetes: Yes:  Diabetes Type:  Diet controlled Genetic Screening: Normal Maternal Ultrasounds/Referrals: Normal Fetal Ultrasounds or other Referrals:  None Maternal Substance Abuse:  No Significant Maternal Medications:  None Significant Maternal Lab Results: None  Results for orders placed or performed during the hospital encounter of 01/02/24 (from the past 24 hours)  Fern Test   Collection Time: 01/02/24  9:45 PM  Result Value Ref Range   POCT Fern Test Positive = ruptured amniotic membanes   Rupture of Membrane (ROM) Plus   Collection Time: 01/02/24  9:53 PM  Result Value Ref Range   Rom Plus POSITIVE     Patient Active Problem List   Diagnosis Date Noted   Encounter for annual health examination 11/30/2023   [redacted] weeks gestation of pregnancy 11/30/2023   Seasonal allergies 11/30/2023   Gestational diabetes mellitus, antepartum 10/29/2023   AMA (advanced maternal age) multigravida  35+ 08/10/2023   Obesity in pregnancy 08/10/2023   History of small bowel obstruction 08/10/2023   Supervision of high risk pregnancy, antepartum 07/28/2023   BMI 36.0-36.9,adult 07/07/2023   History of gestational diabetes mellitus (GDM) 02/02/2014    Assessment: Angelisa Winthrop is a 38 y.o. G4P1021 at [redacted]w[redacted]d here for verified PROM  #Labor:expectant management 2-3 hours then augment with pitocin  if needed #Pain: IV pain medication and epidural prn #FWB: Category 1 strip noted #ID:  GBS pending #MOF: breast/bottle #MOC:nexplanon  #Circ:  Patient does desire  Abigail Abler 01/02/2024, 11:02 PM

## 2024-01-02 NOTE — MAU Provider Note (Incomplete)
 Event Date/Time   First Provider Initiated Contact with Patient 01/02/24 2141       S: Ms. Airlie Blumenberg is a 38 y.o. [id number] at [redacted]w[redacted]d  who presents to MAU today complaining of leaking of fluid since 1930. She reports she was sitting down and felt a gush and when she stood up she noted more.  She states she had just got out the shower.  However, it was so much she had to change her underwear and saturated through a pantyliner. She denies vaginal bleeding. She denies contractions, but reports some cramping that has been ongoing. She reports normal fetal movement.    O: BP 128/75 (BP Location: Right Arm)   Pulse 74   Temp 98.6 F (37 C) (Oral)   Resp 16   Ht 5\' 2"  (1.575 m)   Wt 94.8 kg   LMP 05/06/2023   SpO2 97%   BMI 38.23 kg/m  GENERAL: Well-developed, well-nourished female in no acute distress.  HEAD: Normocephalic, atraumatic.  CHEST: Normal effort of breathing, regular heart rate ABDOMEN: Soft, nontender, gravid PELVIC: Normal external female genitalia. Vagina is pink and rugated. Cervix with normal contour, no lesions. Normal discharge.  *** pooling. Fern Collected  Cervical exam:      Fetal Monitoring: FHT: Toco:   No results found for this or any previous visit (from the past 24 hours).   A: SIUP at [redacted]w[redacted]d  {Blank single:19197::"Membranes intact","SROM"}  P: {Blank single:19197}  Loetta Ringer, CNM 01/02/2024 9:41 PM

## 2024-01-03 ENCOUNTER — Inpatient Hospital Stay (HOSPITAL_COMMUNITY): Admitting: Anesthesiology

## 2024-01-03 ENCOUNTER — Other Ambulatory Visit: Payer: Self-pay

## 2024-01-03 ENCOUNTER — Encounter (HOSPITAL_COMMUNITY): Payer: Self-pay | Admitting: Obstetrics and Gynecology

## 2024-01-03 ENCOUNTER — Encounter (HOSPITAL_COMMUNITY)
Admission: AD | Disposition: A | Discharge status: Home / Self Care | Payer: Self-pay | Source: Home / Self Care | Attending: Family Medicine

## 2024-01-03 DIAGNOSIS — O321XX0 Maternal care for breech presentation, not applicable or unspecified: Secondary | ICD-10-CM | POA: Diagnosis not present

## 2024-01-03 DIAGNOSIS — E66813 Obesity, class 3: Secondary | ICD-10-CM | POA: Diagnosis not present

## 2024-01-03 DIAGNOSIS — Z98891 History of uterine scar from previous surgery: Principal | ICD-10-CM

## 2024-01-03 DIAGNOSIS — Z880 Allergy status to penicillin: Secondary | ICD-10-CM | POA: Diagnosis not present

## 2024-01-03 DIAGNOSIS — O4290 Premature rupture of membranes, unspecified as to length of time between rupture and onset of labor, unspecified weeks of gestation: Secondary | ICD-10-CM | POA: Diagnosis present

## 2024-01-03 DIAGNOSIS — Z87891 Personal history of nicotine dependence: Secondary | ICD-10-CM | POA: Diagnosis not present

## 2024-01-03 DIAGNOSIS — O09523 Supervision of elderly multigravida, third trimester: Secondary | ICD-10-CM | POA: Diagnosis not present

## 2024-01-03 DIAGNOSIS — O99214 Obesity complicating childbirth: Secondary | ICD-10-CM | POA: Diagnosis not present

## 2024-01-03 DIAGNOSIS — O26893 Other specified pregnancy related conditions, third trimester: Secondary | ICD-10-CM | POA: Diagnosis not present

## 2024-01-03 DIAGNOSIS — O24420 Gestational diabetes mellitus in childbirth, diet controlled: Secondary | ICD-10-CM | POA: Diagnosis not present

## 2024-01-03 DIAGNOSIS — Z7982 Long term (current) use of aspirin: Secondary | ICD-10-CM | POA: Diagnosis not present

## 2024-01-03 DIAGNOSIS — Z3A34 34 weeks gestation of pregnancy: Secondary | ICD-10-CM

## 2024-01-03 DIAGNOSIS — O42013 Preterm premature rupture of membranes, onset of labor within 24 hours of rupture, third trimester: Secondary | ICD-10-CM | POA: Diagnosis not present

## 2024-01-03 DIAGNOSIS — Z833 Family history of diabetes mellitus: Secondary | ICD-10-CM | POA: Diagnosis not present

## 2024-01-03 DIAGNOSIS — O42913 Preterm premature rupture of membranes, unspecified as to length of time between rupture and onset of labor, third trimester: Secondary | ICD-10-CM | POA: Diagnosis not present

## 2024-01-03 LAB — TYPE AND SCREEN
ABO/RH(D): O POS
Antibody Screen: NEGATIVE

## 2024-01-03 LAB — RPR: RPR Ser Ql: NONREACTIVE

## 2024-01-03 LAB — GLUCOSE, CAPILLARY
Glucose-Capillary: 139 mg/dL — ABNORMAL HIGH (ref 70–99)
Glucose-Capillary: 88 mg/dL (ref 70–99)
Glucose-Capillary: 84 mg/dL (ref 70–99)
Glucose-Capillary: 76 mg/dL (ref 70–99)
Glucose-Capillary: 83 mg/dL (ref 70–99)
Glucose-Capillary: 154 mg/dL — ABNORMAL HIGH (ref 70–99)

## 2024-01-03 LAB — GROUP B STREP BY PCR: Group B strep by PCR: POSITIVE — AB

## 2024-01-03 LAB — CBC
HCT: 38.7 % (ref 36.0–46.0)
Hemoglobin: 12.7 g/dL (ref 12.0–15.0)
MCH: 27 pg (ref 26.0–34.0)
MCHC: 32.8 g/dL (ref 30.0–36.0)
MCV: 82.3 fL (ref 80.0–100.0)
Platelets: 275 10*3/uL (ref 150–400)
RBC: 4.7 MIL/uL (ref 3.87–5.11)
RDW: 14 % (ref 11.5–15.5)
WBC: 9.3 10*3/uL (ref 4.0–10.5)
nRBC: 0 % (ref 0.0–0.2)

## 2024-01-03 SURGERY — Surgical Case
Anesthesia: Spinal | Site: Abdomen

## 2024-01-03 MED ORDER — DIPHENHYDRAMINE HCL 25 MG PO CAPS: 25.0000 mg | ORAL_CAPSULE | ORAL | Status: DC | PRN

## 2024-01-03 MED ORDER — DROPERIDOL 2.5 MG/ML IJ SOLN: 0.6250 mg | Freq: Once | INTRAMUSCULAR | Status: DC | PRN

## 2024-01-03 MED ORDER — DIPHENHYDRAMINE HCL 50 MG/ML IJ SOLN: 12.5000 mg | INTRAMUSCULAR | Status: DC | PRN

## 2024-01-03 MED ORDER — KETOROLAC TROMETHAMINE 30 MG/ML IJ SOLN
30.0000 mg | Freq: Four times a day (QID) | INTRAMUSCULAR | Status: AC
  Administered 2024-01-03 – 2024-01-04 (×4): 30 mg via INTRAVENOUS
  Filled 2024-01-03 (×4): qty 1

## 2024-01-03 MED ORDER — PHENYLEPHRINE 80 MCG/ML (10ML) SYRINGE FOR IV PUSH (FOR BLOOD PRESSURE SUPPORT)
PREFILLED_SYRINGE | INTRAVENOUS | Status: AC
  Filled 2024-01-03: qty 10

## 2024-01-03 MED ORDER — SOD CITRATE-CITRIC ACID 500-334 MG/5ML PO SOLN
30.0000 mL | ORAL | Status: DC | PRN
  Filled 2024-01-03: qty 30

## 2024-01-03 MED ORDER — SODIUM CHLORIDE 0.9% FLUSH
3.0000 mL | Freq: Two times a day (BID) | INTRAVENOUS | Status: DC
  Administered 2024-01-04 – 2024-01-05 (×3): 10 mL via INTRAVENOUS

## 2024-01-03 MED ORDER — SODIUM CHLORIDE 0.9 % IV SOLN: 500.0000 mg | INTRAVENOUS | Status: DC

## 2024-01-03 MED ORDER — ACETAMINOPHEN 500 MG PO TABS
1000.0000 mg | ORAL_TABLET | Freq: Four times a day (QID) | ORAL | Status: DC
  Administered 2024-01-03 – 2024-01-06 (×11): 1000 mg via ORAL
  Filled 2024-01-03 (×11): qty 2

## 2024-01-03 MED ORDER — OXYTOCIN-SODIUM CHLORIDE 30-0.9 UT/500ML-% IV SOLN: 2.5000 [IU]/h | INTRAVENOUS | Status: AC

## 2024-01-03 MED ORDER — OXYCODONE HCL 5 MG PO TABS
5.0000 mg | ORAL_TABLET | Freq: Four times a day (QID) | ORAL | Status: DC | PRN
  Administered 2024-01-05: 10 mg via ORAL
  Administered 2024-01-05 – 2024-01-06 (×4): 5 mg via ORAL
  Filled 2024-01-03 (×2): qty 1
  Filled 2024-01-03: qty 2
  Filled 2024-01-03 (×4): qty 1
  Filled 2024-01-03 (×2): qty 2

## 2024-01-03 MED ORDER — SODIUM CHLORIDE 0.9% FLUSH: 3.0000 mL | INTRAVENOUS | Status: DC | PRN

## 2024-01-03 MED ORDER — NALOXONE HCL 0.4 MG/ML IJ SOLN: 0.4000 mg | INTRAMUSCULAR | Status: DC | PRN

## 2024-01-03 MED ORDER — HYDROMORPHONE HCL 1 MG/ML IJ SOLN: 0.2500 mg | INTRAMUSCULAR | Status: DC | PRN

## 2024-01-03 MED ORDER — GABAPENTIN 300 MG PO CAPS
300.0000 mg | ORAL_CAPSULE | Freq: Three times a day (TID) | ORAL | Status: DC
  Administered 2024-01-03 – 2024-01-06 (×8): 300 mg via ORAL
  Filled 2024-01-03 (×9): qty 1

## 2024-01-03 MED ORDER — SIMETHICONE 80 MG PO CHEW: 80.0000 mg | CHEWABLE_TABLET | ORAL | Status: DC | PRN

## 2024-01-03 MED ORDER — PRENATAL MULTIVITAMIN CH
1.0000 | ORAL_TABLET | Freq: Every day | ORAL | Status: DC
  Administered 2024-01-04 – 2024-01-06 (×3): 1 via ORAL
  Filled 2024-01-03 (×4): qty 1

## 2024-01-03 MED ORDER — BUPIVACAINE IN DEXTROSE 0.75-8.25 % IT SOLN
INTRATHECAL | Status: DC | PRN
  Administered 2024-01-03: 1.6 mL via INTRATHECAL

## 2024-01-03 MED ORDER — LACTATED RINGERS IV SOLN: 500.0000 mL | INTRAVENOUS | Status: DC | PRN

## 2024-01-03 MED ORDER — MEPERIDINE HCL 25 MG/ML IJ SOLN: 6.2500 mg | INTRAMUSCULAR | Status: DC | PRN

## 2024-01-03 MED ORDER — LACTATED RINGERS IV BOLUS
500.0000 mL | Freq: Once | INTRAVENOUS | Status: AC
  Administered 2024-01-03: 500 mL via INTRAVENOUS

## 2024-01-03 MED ORDER — CEFAZOLIN SODIUM-DEXTROSE 2-3 GM-%(50ML) IV SOLR
INTRAVENOUS | Status: DC | PRN
  Administered 2024-01-03: 2 g via INTRAVENOUS

## 2024-01-03 MED ORDER — GABAPENTIN 100 MG PO CAPS: 200.0000 mg | ORAL_CAPSULE | Freq: Every day | ORAL | Status: DC

## 2024-01-03 MED ORDER — MENTHOL 3 MG MT LOZG: 1.0000 | LOZENGE | OROMUCOSAL | Status: DC | PRN

## 2024-01-03 MED ORDER — DIBUCAINE (PERIANAL) 1 % EX OINT: 1.0000 | TOPICAL_OINTMENT | CUTANEOUS | Status: DC | PRN

## 2024-01-03 MED ORDER — SODIUM CHLORIDE 0.9 % IR SOLN
Status: DC | PRN
  Administered 2024-01-03: 1

## 2024-01-03 MED ORDER — FENTANYL CITRATE (PF) 100 MCG/2ML IJ SOLN
INTRAMUSCULAR | Status: DC | PRN
  Administered 2024-01-03: 15 ug via INTRATHECAL

## 2024-01-03 MED ORDER — ONDANSETRON HCL 4 MG/2ML IJ SOLN
INTRAMUSCULAR | Status: AC
  Filled 2024-01-03: qty 2

## 2024-01-03 MED ORDER — LIDOCAINE HCL (PF) 1 % IJ SOLN: 30.0000 mL | INTRAMUSCULAR | Status: DC | PRN

## 2024-01-03 MED ORDER — NALOXONE HCL 4 MG/10ML IJ SOLN: 1.0000 ug/kg/h | INTRAVENOUS | Status: DC | PRN

## 2024-01-03 MED ORDER — EPHEDRINE 5 MG/ML INJ
INTRAVENOUS | Status: AC
Start: 2024-01-03 — End: ?
  Filled 2024-01-03: qty 5

## 2024-01-03 MED ORDER — FENTANYL CITRATE (PF) 100 MCG/2ML IJ SOLN
INTRAMUSCULAR | Status: AC
  Filled 2024-01-03: qty 2

## 2024-01-03 MED ORDER — DEXAMETHASONE SODIUM PHOSPHATE 10 MG/ML IJ SOLN
INTRAMUSCULAR | Status: DC | PRN
  Administered 2024-01-03: 10 mg via INTRAVENOUS

## 2024-01-03 MED ORDER — PHENYLEPHRINE 80 MCG/ML (10ML) SYRINGE FOR IV PUSH (FOR BLOOD PRESSURE SUPPORT)
PREFILLED_SYRINGE | INTRAVENOUS | Status: DC | PRN
  Administered 2024-01-03: 160 ug via INTRAVENOUS

## 2024-01-03 MED ORDER — CEFAZOLIN SODIUM-DEXTROSE 2-4 GM/100ML-% IV SOLN
2.0000 g | Freq: Once | INTRAVENOUS | Status: AC
  Administered 2024-01-03: 2 g via INTRAVENOUS
  Filled 2024-01-03: qty 100

## 2024-01-03 MED ORDER — OXYTOCIN-SODIUM CHLORIDE 30-0.9 UT/500ML-% IV SOLN
INTRAVENOUS | Status: DC | PRN
  Administered 2024-01-03: 300 mL via INTRAVENOUS

## 2024-01-03 MED ORDER — SCOPOLAMINE 1 MG/3DAYS TD PT72
1.0000 | MEDICATED_PATCH | Freq: Once | TRANSDERMAL | Status: AC
  Administered 2024-01-03: 1.5 mg via TRANSDERMAL

## 2024-01-03 MED ORDER — ACETAMINOPHEN 325 MG PO TABS: 650.0000 mg | ORAL_TABLET | ORAL | Status: DC | PRN

## 2024-01-03 MED ORDER — MORPHINE SULFATE (PF) 0.5 MG/ML IJ SOLN
INTRAMUSCULAR | Status: DC | PRN
Start: 2024-01-03 — End: 2024-01-03
  Administered 2024-01-03: 150 ug via INTRATHECAL

## 2024-01-03 MED ORDER — STERILE WATER FOR IRRIGATION IR SOLN
Status: DC | PRN
  Administered 2024-01-03: 1000 mL

## 2024-01-03 MED ORDER — CEFAZOLIN SODIUM-DEXTROSE 2-4 GM/100ML-% IV SOLN: 2.0000 g | INTRAVENOUS | Status: DC

## 2024-01-03 MED ORDER — OXYCODONE-ACETAMINOPHEN 5-325 MG PO TABS: 1.0000 | ORAL_TABLET | ORAL | Status: DC | PRN

## 2024-01-03 MED ORDER — SOD CITRATE-CITRIC ACID 500-334 MG/5ML PO SOLN
30.0000 mL | ORAL | Status: AC
  Administered 2024-01-03: 30 mL via ORAL

## 2024-01-03 MED ORDER — CEFAZOLIN SODIUM-DEXTROSE 1-4 GM/50ML-% IV SOLN
1.0000 g | Freq: Three times a day (TID) | INTRAVENOUS | Status: DC
  Administered 2024-01-03: 1 g via INTRAVENOUS
  Filled 2024-01-03: qty 50

## 2024-01-03 MED ORDER — SCOPOLAMINE 1 MG/3DAYS TD PT72
MEDICATED_PATCH | TRANSDERMAL | Status: AC
  Filled 2024-01-03: qty 1

## 2024-01-03 MED ORDER — DEXAMETHASONE SODIUM PHOSPHATE 10 MG/ML IJ SOLN
INTRAMUSCULAR | Status: AC
  Filled 2024-01-03: qty 1

## 2024-01-03 MED ORDER — ONDANSETRON HCL 4 MG/2ML IJ SOLN: 4.0000 mg | Freq: Three times a day (TID) | INTRAMUSCULAR | Status: DC | PRN

## 2024-01-03 MED ORDER — FENTANYL CITRATE (PF) 100 MCG/2ML IJ SOLN
50.0000 ug | INTRAMUSCULAR | Status: DC | PRN
Start: 2024-01-03 — End: 2024-01-03

## 2024-01-03 MED ORDER — OXYTOCIN-SODIUM CHLORIDE 30-0.9 UT/500ML-% IV SOLN: 2.5000 [IU]/h | INTRAVENOUS | Status: DC

## 2024-01-03 MED ORDER — OXYTOCIN BOLUS FROM INFUSION: 333.0000 mL | Freq: Once | INTRAVENOUS | Status: DC

## 2024-01-03 MED ORDER — ONDANSETRON HCL 4 MG/2ML IJ SOLN
INTRAMUSCULAR | Status: DC | PRN
  Administered 2024-01-03: 4 mg via INTRAVENOUS

## 2024-01-03 MED ORDER — COCONUT OIL OIL
1.0000 | TOPICAL_OIL | Status: DC | PRN
  Administered 2024-01-04: 1 via TOPICAL

## 2024-01-03 MED ORDER — MAGNESIUM HYDROXIDE 400 MG/5ML PO SUSP
30.0000 mL | ORAL | Status: DC | PRN
  Administered 2024-01-05: 30 mL via ORAL
  Filled 2024-01-03: qty 30

## 2024-01-03 MED ORDER — DIPHENHYDRAMINE HCL 25 MG PO CAPS: 25.0000 mg | ORAL_CAPSULE | Freq: Four times a day (QID) | ORAL | Status: DC | PRN

## 2024-01-03 MED ORDER — SIMETHICONE 80 MG PO CHEW
80.0000 mg | CHEWABLE_TABLET | Freq: Three times a day (TID) | ORAL | Status: DC
  Administered 2024-01-04 – 2024-01-06 (×8): 80 mg via ORAL
  Filled 2024-01-03 (×9): qty 1

## 2024-01-03 MED ORDER — SODIUM CHLORIDE 0.9 % IV SOLN
INTRAVENOUS | Status: DC | PRN
  Administered 2024-01-03: 500 mg via INTRAVENOUS

## 2024-01-03 MED ORDER — SENNOSIDES-DOCUSATE SODIUM 8.6-50 MG PO TABS
2.0000 | ORAL_TABLET | Freq: Every day | ORAL | Status: DC
  Administered 2024-01-04 – 2024-01-06 (×3): 2 via ORAL
  Filled 2024-01-03 (×3): qty 2

## 2024-01-03 MED ORDER — WITCH HAZEL-GLYCERIN EX PADS: 1.0000 | MEDICATED_PAD | CUTANEOUS | Status: DC | PRN

## 2024-01-03 MED ORDER — LACTATED RINGERS IV SOLN: INTRAVENOUS | Status: DC | PRN

## 2024-01-03 MED ORDER — PHENYLEPHRINE HCL-NACL 20-0.9 MG/250ML-% IV SOLN
INTRAVENOUS | Status: DC | PRN
  Administered 2024-01-03: 40 ug/min via INTRAVENOUS

## 2024-01-03 MED ORDER — IBUPROFEN 600 MG PO TABS
600.0000 mg | ORAL_TABLET | Freq: Four times a day (QID) | ORAL | Status: DC
  Administered 2024-01-04 – 2024-01-06 (×8): 600 mg via ORAL
  Filled 2024-01-03 (×8): qty 1

## 2024-01-03 MED ORDER — ACETAMINOPHEN 10 MG/ML IV SOLN
INTRAVENOUS | Status: DC | PRN
  Administered 2024-01-03: 1000 mg via INTRAVENOUS

## 2024-01-03 MED ORDER — MORPHINE SULFATE (PF) 0.5 MG/ML IJ SOLN
INTRAMUSCULAR | Status: AC
  Filled 2024-01-03: qty 10

## 2024-01-03 MED ORDER — ONDANSETRON HCL 4 MG/2ML IJ SOLN: 4.0000 mg | Freq: Four times a day (QID) | INTRAMUSCULAR | Status: DC | PRN

## 2024-01-03 MED ORDER — ENOXAPARIN SODIUM 60 MG/0.6ML IJ SOSY
50.0000 mg | PREFILLED_SYRINGE | INTRAMUSCULAR | Status: DC
  Administered 2024-01-04 – 2024-01-06 (×3): 50 mg via SUBCUTANEOUS
  Filled 2024-01-03 (×3): qty 0.6

## 2024-01-03 SURGICAL SUPPLY — 32 items
BENZOIN TINCTURE PRP APPL 2/3 (GAUZE/BANDAGES/DRESSINGS) ×1 IMPLANT
CHLORAPREP W/TINT 26 (MISCELLANEOUS) ×2 IMPLANT
CLAMP UMBILICAL CORD (MISCELLANEOUS) ×1 IMPLANT
CLOTH BEACON ORANGE TIMEOUT ST (SAFETY) ×1 IMPLANT
DRSG OPSITE POSTOP 4X10 (GAUZE/BANDAGES/DRESSINGS) ×1 IMPLANT
ELECTRODE REM PT RTRN 9FT ADLT (ELECTROSURGICAL) ×1 IMPLANT
EXTRACTOR VACUUM M CUP 4 TUBE (SUCTIONS) IMPLANT
GAUZE PAD ABD 7.5X8 STRL (GAUZE/BANDAGES/DRESSINGS) IMPLANT
GAUZE SPONGE 4X4 12PLY STRL LF (GAUZE/BANDAGES/DRESSINGS) IMPLANT
GLOVE BIOGEL PI IND STRL 7.0 (GLOVE) ×3 IMPLANT
GLOVE ECLIPSE 7.0 STRL STRAW (GLOVE) ×1 IMPLANT
GOWN STRL REUS W/TWL LRG LVL3 (GOWN DISPOSABLE) ×2 IMPLANT
KIT ABG SYR 3ML LUER SLIP (SYRINGE) ×1 IMPLANT
MAT PREVALON FULL STRYKER (MISCELLANEOUS) IMPLANT
NDL HYPO 25X5/8 SAFETYGLIDE (NEEDLE) ×1 IMPLANT
NEEDLE HYPO 22GX1.5 SAFETY (NEEDLE) ×1 IMPLANT
NEEDLE HYPO 25X5/8 SAFETYGLIDE (NEEDLE) ×1 IMPLANT
NS IRRIG 1000ML POUR BTL (IV SOLUTION) ×1 IMPLANT
PACK C SECTION WH (CUSTOM PROCEDURE TRAY) ×1 IMPLANT
PAD ABD 7.5X8 STRL (GAUZE/BANDAGES/DRESSINGS) ×1 IMPLANT
PAD OB MATERNITY 4.3X12.25 (PERSONAL CARE ITEMS) ×1 IMPLANT
RTRCTR C-SECT PINK 25CM LRG (MISCELLANEOUS) ×1 IMPLANT
STRIP CLOSURE SKIN 1/2X4 (GAUZE/BANDAGES/DRESSINGS) ×1 IMPLANT
SUT MNCRL 0 VIOLET CTX 36 (SUTURE) ×2 IMPLANT
SUT PLAIN ABS 2-0 CT1 27XMFL (SUTURE) IMPLANT
SUT VIC AB 0 CTX36XBRD ANBCTRL (SUTURE) ×1 IMPLANT
SUT VIC AB 4-0 KS 27 (SUTURE) ×1 IMPLANT
SYR 30ML LL (SYRINGE) ×1 IMPLANT
TAPE CLOTH SURG 4X10 WHT LF (GAUZE/BANDAGES/DRESSINGS) IMPLANT
TOWEL OR 17X24 6PK STRL BLUE (TOWEL DISPOSABLE) ×1 IMPLANT
TRAY FOLEY W/BAG SLVR 14FR LF (SET/KITS/TRAYS/PACK) ×1 IMPLANT
WATER STERILE IRR 1000ML POUR (IV SOLUTION) ×1 IMPLANT

## 2024-01-03 NOTE — Progress Notes (Signed)
 Patient ID: Suzanne Lane, female   DOB: August 07, 1986, 38 y.o.   MRN: [id number] Patient was counseled regarding elective vaginal breech delivery versus external cephalic version versus primary C-section.  Risks and benefits of all of the above were discussed in detail with patient.  She was given time to consider this with her partner and she is elected for primary C-section.  Risks of surgery including bleeding infection injury to surrounding organs and impact on future pregnancies were all reviewed with her.  She is quite concerned about potential fetal risks with vaginal delivery and does not want to attempt external cephalic version.  We will move her to the OR as soon as able.

## 2024-01-03 NOTE — Discharge Summary (Signed)
 Postpartum Discharge Summary  Date of Service updated***     Patient Name: Suzanne Lane DOB: 1985/11/16 MRN: [id number]  Date of admission: 01/02/2024 Delivery date:01/03/2024 Delivering provider: Ferdie Housekeeper Date of discharge: 01/06/2024  Admitting diagnosis: PROM (premature rupture of membranes) [O42.90] S/P cesarean section [Z98.891] Intrauterine pregnancy: [redacted]w[redacted]d     Secondary diagnosis:  Principal Problem:   S/P cesarean section Active Problems:   PROM (premature rupture of membranes)  Additional problems: ***    Discharge diagnosis: Preterm Pregnancy Delivered and GDM A1                                              Post partum procedures:{Postpartum procedures:23558} Augmentation: N/A Complications: {OB Labor/Delivery Complications:20784}  Hospital course: Onset of Labor With Unplanned C/S   38 y.o. yo [id number] at [redacted]w[redacted]d was admitted for PPROM at [redacted]w[redacted]d on 01/02/2024. Baby in transverse (back up) breech presentation -- pt declined ECV, desired primary LTCS. The patient went for cesarean section due to Malpresentation. Delivery details as follows: Membrane Rupture Time/Date: 7:30 PM,01/02/2024  Delivery Method:C-Section, Low Transverse Operative Delivery:N/A Details of operation can be found in separate operative note. Patient had a postpartum course complicated by***.  She is ambulating,tolerating a regular diet, passing flatus, and urinating well.  Patient is discharged home in stable condition 01/06/24.  Newborn Data: Birth date:01/03/2024 Birth time:12:55 PM Gender:Female Living status:Living Apgars: ,  Weight:2360 g  Magnesium Sulfate received: {Mag received:30440022} BMZ received: No Rhophylac:N/A MMR:N/A T-DaP:Given prenatally Flu: Yes RSV Vaccine received: No Transfusion:{Transfusion received:30440034}  Immunizations received: Immunization History  Administered Date(s) Administered   Influenza, Seasonal, Injecte, Preservative Fre 08/31/2023   MMR  04/01/2014   PPD Test 07/13/2020   Tdap 01/18/2014, 10/28/2023    Physical exam  Vitals:   01/05/24 1610 01/05/24 1926 01/05/24 1927 01/06/24 0019  BP: 126/72  121/69 105/62  Pulse: 77  76 77  Resp: 16  19 18   Temp: (!) 97.3 F (36.3 C) 98.3 F (36.8 C)  97.8 F (36.6 C)  TempSrc: Axillary Oral  Oral  SpO2: 100%   100%  Weight:      Height:       General: {Exam; general:21111117} Lochia: {Desc; appropriate/inappropriate:30686::"appropriate"} Uterine Fundus: {Desc; firm/soft:30687} Incision: {Exam; incision:21111123} DVT Evaluation: {Exam; dvt:2111122} Labs: Lab Results  Component Value Date   WBC 15.8 (H) 01/04/2024   HGB 12.2 01/04/2024   HCT 38.5 01/04/2024   MCV 84.6 01/04/2024   PLT 304 01/04/2024      Latest Ref Rng & Units 01/04/2024    4:22 AM  CMP  Creatinine 0.44 - 1.00 mg/dL 6.57    Edinburgh Score:    01/03/2024    7:29 PM  Edinburgh Postnatal Depression Scale Screening Tool  I have been able to laugh and see the funny side of things. 0  I have looked forward with enjoyment to things. 0  I have blamed myself unnecessarily when things went wrong. 2  I have been anxious or worried for no good reason. 2  I have felt scared or panicky for no good reason. 1  Things have been getting on top of me. 1  I have been so unhappy that I have had difficulty sleeping. 1  I have felt sad or miserable. 1  I have been so unhappy that I have been crying. 1  The thought of harming  myself has occurred to me. 0  Edinburgh Postnatal Depression Scale Total 9   No data recorded  After visit meds:  Allergies as of 01/06/2024       Reactions   Penicillins Hives   Can take cephalosporins- no reaction        Medication List     STOP taking these medications    Accu-Chek Guide Test test strip Generic drug: glucose blood   Accu-Chek Guide w/Device Kit   Accu-Chek Softclix Lancets lancets   aspirin  EC 81 MG tablet   DOCUSATE SODIUM  PO   nitrofurantoin   (macrocrystal-monohydrate) 100 MG capsule Commonly known as: MACROBID        TAKE these medications    clobetasol  ointment 0.05 % Commonly known as: TEMOVATE  Apply 1 Application topically 2 (two) times daily.   gabapentin 300 MG capsule Commonly known as: NEURONTIN Take 1 capsule (300 mg total) by mouth 2 (two) times daily for 10 days.   ibuprofen  600 MG tablet Commonly known as: ADVIL  Take 1 tablet (600 mg total) by mouth every 6 (six) hours as needed.   oxyCODONE -acetaminophen  5-325 MG tablet Commonly known as: PERCOCET/ROXICET Take 1-2 tablets by mouth every 6 (six) hours as needed.   polyethylene glycol 17 g packet Commonly known as: MiraLax Take 17 g by mouth every other day for 7 doses.   prenatal vitamin w/FE, FA 27-1 MG Tabs tablet Take 1 tablet by mouth daily.   simethicone  80 MG chewable tablet Commonly known as: MYLICON Chew 1 tablet (80 mg total) by mouth as needed for flatulence.   terconazole  0.4 % vaginal cream Commonly known as: TERAZOL 7  Place 1 applicator vaginally at bedtime.         Discharge home in stable condition Infant Feeding: Breast Infant Disposition:NICU Discharge instruction: per After Visit Summary and Postpartum booklet. Activity: Advance as tolerated. Pelvic rest for 6 weeks.  Diet: routine diet Future Appointments: Future Appointments  Date Time Provider Department Center  01/12/2024  3:00 PM [facility] NURSE [facility]  02/18/2024  8:50 AM [facility] LAB [facility]  02/18/2024 10:35 AM Tari Fare, CNM [facility]  06/01/2024 10:40 AM [name], FNP [facility] None  11/30/2024 10:20 AM [name], FNP [facility] None   Follow up Visit: Message sent to [facility] 5/25  Please schedule this patient for a In person postpartum visit in 6 weeks with the following provider: Any provider. Additional Postpartum F/U:2 hour GTT and Incision check 1 week  High risk pregnancy complicated by: GDM Delivery mode:  C-Section, Low  Transverse Anticipated Birth Control:  Nexplanon    01/06/2024 Raynell Caller, MD

## 2024-01-03 NOTE — Op Note (Signed)
 Suzanne Lane PROCEDURE DATE: 01/03/24   PREOPERATIVE DIAGNOSES: Intrauterine pregnancy at [redacted]w[redacted]d weeks gestation; PPROM, breech presentation (declined ECV)  POSTOPERATIVE DIAGNOSES: The same, viable infant delivered  PROCEDURE: PrimaryLow Transverse Cesarean Section  SURGEON:  Dr. Gini Lain Cresenzo  ASSISTANT:  Melanie Spires, MD An experienced assistant was required given the standard of surgical care given the complexity of the case.  This assistant was needed for exposure, dissection, suctioning, retraction, instrument exchange, assisting with delivery with administration of fundal pressure, and for overall help during the procedure.  ANESTHESIOLOGY TEAM: Anesthesiologist: Gorman Laughter, MD CRNA: Anita Kerry, CRNA  INDICATIONS: Suzanne Lane is a 39 y.o. [phone] at [redacted]w[redacted]d here for cesarean section secondary to the indications listed under preoperative diagnoses; please see preoperative note for further details.  The risks of surgery were discussed with the patient including but were not limited to: bleeding which may require transfusion or reoperation; infection which may require antibiotics; injury to bowel, bladder, ureters or other surrounding organs; injury to the fetus; need for additional procedures including hysterectomy in the event of a life-threatening hemorrhage; formation of adhesions; placental abnormalities wth subsequent pregnancies; incisional problems; thromboembolic phenomenon and other postoperative/anesthesia complications.  The patient concurred with the proposed plan, giving informed written consent for the procedure.    FINDINGS:  Viable female infant in transverse complete breech presentation.  Apgars 8 and 9.  Amniotic fluid: clear.  Intact placenta, three vessel cord.  Normal uterus, fallopian tubes and ovaries bilaterally.  ANESTHESIA: spinal INTRAVENOUS FLUIDS: 1500 ml   ESTIMATED BLOOD LOSS: 427 ml URINE OUTPUT:  75 ml SPECIMENS: Placenta sent to L&D  and arterial cord gas collected COMPLICATIONS: None immediate  PROCEDURE IN DETAIL:  The patient preoperatively received intravenous antibiotics and had sequential compression devices applied to her lower extremities.  She was then taken to the operating room where spinal anesthesia was found to be adequate. She was then placed in a dorsal supine position with a leftward tilt, and prepped and draped in a sterile manner.  A foley catheter was  placed into her bladder and attached to constant gravity.  After an adequate timeout was performed, a Pfannenstiel skin incision was made with scalpel and carried through to the underlying layer of fascia. The fascia was incised in the midline, and this incision was extended bluntly. The rectus muscles were separated in the midline and the peritoneum was entered bluntly.   The Alexis self-retaining retractor was introduced into the abdominal cavity.  Attention was turned to the lower uterine segment where a low transverse hysterotomy was made with a scalpel and extended bluntly in caudad and cephalad directions.  The infant was successfully delivered from breech presentation, baby was slow to cry and with poor tone so the cord was clamped and cut immediately, and the infant was handed over to the awaiting neonatology team. Uterine massage was then administered, and the placenta delivered intact with a three-vessel cord. The uterus was then cleared of clots and debris.  The hysterotomy was closed with 0-Monocryl in a running fashion. Figure-of-eight 0 Vicryl serosal stitches were placed to help with hemostasis.    The pelvis was cleared of all clot and debris. Hemostasis was confirmed on all surfaces.  The retractor was removed.  The peritoneum was closed with a 2-0 Vicryl running stitch. The fascia was then closed using 0 Vicryl in a running fashion.  The subcutaneous layer was irrigated, any areas of bleeding were cauterized with the bovie,  was reapproximated with  2-0  plain gut in a running fashion, was found to be hemostatic. The skin was closed with a 4-0 Vicryl subcuticular stitch. The patient tolerated the procedure well. Sponge, instrument and needle counts were correct x 3.  She was taken to the recovery room in stable condition.   Melanie Spires, MD FMOB Fellow, Faculty practice [facility], Center for [facility] 01/03/24  1:42 PM

## 2024-01-03 NOTE — Anesthesia Preprocedure Evaluation (Signed)
 Anesthesia Evaluation    Reviewed: Allergy & Precautions, H&P , Patient's Chart, lab work & pertinent test results, Unable to perform ROS - Chart review only  Airway Mallampati: III  TM Distance: >3 FB Neck ROM: Full    Dental no notable dental hx. (+) Teeth Intact   Pulmonary former smoker   Pulmonary exam normal breath sounds clear to auscultation       Cardiovascular negative cardio ROS Normal cardiovascular exam Rhythm:Regular Rate:Normal     Neuro/Psych negative neurological ROS  negative psych ROS   GI/Hepatic negative GI ROS, Neg liver ROS,,,  Endo/Other  diabetes, Gestational  Class 3 obesityObesity  Renal/GU Renal diseasePyelonephritis     Musculoskeletal negative musculoskeletal ROS (+)    Abdominal  (+) + obese  Peds  Hematology negative hematology ROS (+)   Anesthesia Other Findings   Reproductive/Obstetrics (+) Pregnancy                             Anesthesia Physical Anesthesia Plan  ASA: 2  Anesthesia Plan: Spinal   Post-op Pain Management: Ofirmev  IV (intra-op)* and Toradol  IV (intra-op)*   Induction:   PONV Risk Score and Plan: 4 or greater and Ondansetron , Dexamethasone and Treatment may vary due to age or medical condition  Airway Management Planned: Natural Airway  Additional Equipment:   Intra-op Plan:   Post-operative Plan:   Informed Consent: I have reviewed the patients History and Physical, chart, labs and discussed the procedure including the risks, benefits and alternatives for the proposed anesthesia with the patient or authorized representative who has indicated his/her understanding and acceptance.       Plan Discussed with: Anesthesiologist  Anesthesia Plan Comments:         Anesthesia Quick Evaluation

## 2024-01-03 NOTE — Progress Notes (Signed)
 S: Ms. Suzanne Lane is a 38 y.o. [phone] at [redacted]w[redacted]d  who presents to [facility] today complaining of leaking of fluid since 1930. She denies vaginal bleeding. She endorses contractions. She reports normal fetal movement.    O: BP 107/67 (BP Location: Left Arm)   Pulse 72   Temp 97.8 F (36.6 C) (Oral)   Resp 16   Ht 5\' 2"  (1.575 m)   Wt 94.8 kg   LMP 05/06/2023   SpO2 95%   BMI 38.23 kg/m  GENERAL: Well-developed, well-nourished female in no acute distress.  HEAD: Normocephalic, atraumatic.  CHEST: Normal effort of breathing, regular heart rate ABDOMEN: Soft, nontender, gravid PELVIC: Normal external female genitalia. Vagina is pink and rugated. Cervix with normal contour, no lesions. Appears to be ~1-2cm. Normal discharge.  Negative pooling, but ? fluid noted with valsalva maneuver. Fern and Rom+ Collected.  Cervical exam:  Deferred   Patient informed that the ultrasound is considered a limited OB ultrasound and is not intended to be a complete ultrasound exam.  Patient also informed that the ultrasound is not being completed with the intent of assessing for fetal or placental anomalies or any pelvic abnormalities.  Explained that the purpose of today's ultrasound is to assess for  presentation.  Patient acknowledges the purpose of the exam and the limitations of the study.  Cephalic presentation noted  Fetal Monitoring: FHT: 125 bpm, Mod Var, -Decels, +Accels Toco: Q3-26min   A: SIUP at [redacted]w[redacted]d  Cat I FT pPROM  P: -Fern returns positive, but limited findings. -ROM plus sent and returns positive.  -BSUS performed and patient informed fetus cephalic. Reports she was told this by tech on 5/22, but MD reported breech presentation shortly after.  -Dr. Anniece Base at bedside and confirms cephalic presentation.  -Reviews recommendation for augmentation and subsequent delivery.  -Nurse informed of POC.  Loetta Ringer, CNM

## 2024-01-03 NOTE — Plan of Care (Signed)
  Problem: Education: Goal: Knowledge of General Education information will improve Description: Including pain rating scale, medication(s)/side effects and non-pharmacologic comfort measures Outcome: Progressing   Problem: Health Behavior/Discharge Planning: Goal: Ability to manage health-related needs will improve Outcome: Progressing   Problem: Clinical Measurements: Goal: Ability to maintain clinical measurements within normal limits will improve Outcome: Progressing Goal: Will remain free from infection Outcome: Progressing Goal: Diagnostic test results will improve Outcome: Progressing Goal: Respiratory complications will improve Outcome: Progressing Goal: Cardiovascular complication will be avoided Outcome: Progressing   Problem: Activity: Goal: Risk for activity intolerance will decrease Outcome: Progressing   Problem: Nutrition: Goal: Adequate nutrition will be maintained Outcome: Progressing   Problem: Coping: Goal: Level of anxiety will decrease Outcome: Progressing   Problem: Elimination: Goal: Will not experience complications related to bowel motility Outcome: Progressing Goal: Will not experience complications related to urinary retention Outcome: Progressing   Problem: Pain Managment: Goal: General experience of comfort will improve and/or be controlled Outcome: Progressing   Problem: Safety: Goal: Ability to remain free from injury will improve Outcome: Progressing   Problem: Skin Integrity: Goal: Risk for impaired skin integrity will decrease Outcome: Progressing   Problem: Pain Management: Goal: Relief or control of pain from uterine contractions will improve Outcome: Progressing   Problem: Education: Goal: Knowledge of the prescribed therapeutic regimen will improve Outcome: Progressing Goal: Understanding of sexual limitations or changes related to disease process or condition will improve Outcome: Progressing Goal: Individualized  Educational Video(s) Outcome: Progressing   Problem: Self-Concept: Goal: Communication of feelings regarding changes in body function or appearance will improve Outcome: Progressing   Problem: Skin Integrity: Goal: Demonstration of wound healing without infection will improve Outcome: Progressing   Problem: Education: Goal: Knowledge of condition will improve Outcome: Progressing Goal: Individualized Educational Video(s) Outcome: Progressing Goal: Individualized Newborn Educational Video(s) Outcome: Progressing   Problem: Activity: Goal: Will verbalize the importance of balancing activity with adequate rest periods Outcome: Progressing Goal: Ability to tolerate increased activity will improve Outcome: Progressing   Problem: Coping: Goal: Ability to identify and utilize available resources and services will improve Outcome: Progressing   Problem: Life Cycle: Goal: Chance of risk for complications during the postpartum period will decrease Outcome: Progressing   Problem: Role Relationship: Goal: Ability to demonstrate positive interaction with newborn will improve Outcome: Progressing   Problem: Skin Integrity: Goal: Demonstration of wound healing without infection will improve Outcome: Progressing   Problem: Education: Goal: Knowledge of condition will improve Outcome: Progressing Goal: Individualized Educational Video(s) Outcome: Progressing Goal: Individualized Newborn Educational Video(s) Outcome: Progressing   Problem: Activity: Goal: Will verbalize the importance of balancing activity with adequate rest periods Outcome: Progressing Goal: Ability to tolerate increased activity will improve Outcome: Progressing   Problem: Coping: Goal: Ability to identify and utilize available resources and services will improve Outcome: Progressing   Problem: Life Cycle: Goal: Chance of risk for complications during the postpartum period will decrease Outcome:  Progressing

## 2024-01-03 NOTE — Progress Notes (Signed)
 Patient ID: Suzanne Lane, female   DOB: Aug 21, 1985, 38 y.o.   MRN: [id number] Late Entry for 0230  Pt informed that the ultrasound is considered a limited OB ultrasound and is not intended to be a complete ultrasound exam.  Patient also informed that the ultrasound is not being completed with the intent of assessing for fetal or placental anomalies or any pelvic abnormalities.  Explained that the purpose of today's ultrasound is to assess for presentation    Patient acknowledges the purpose of the exam and the limitations of the study.    Bedside US  done to confirm my finding of breech presentation on cervical exam  Dilation: 1.5 Effacement (%): Thick Station: Ballotable Exam by:: Holmes Lusher CNM  US  does confirm breech presentation with head just to right of umbilicus.   Consuted Dr Racheal Buddle who will arrange for C/S when patient has been NPO for 8 hours.

## 2024-01-03 NOTE — Progress Notes (Signed)
 MD informed that small parts were felt during cervical exam.  Breech presentation confirmed by midwife with bedside scan as well as by myself,  Fetal head is in the right upper quadrant and the fetus is otherwise breech,  Pt did eat at 0130.   Will proceed with scheduled cesarean section in  AM.  Orders.  Pt is aware that she can potentially attempt a breech delivery with th oncoming provider, but this will need to be discussed.   Avie Boeck, MD

## 2024-01-03 NOTE — OR Nursing (Signed)
 Monitoring Temperature and humidity during case to make sure within range OR temperature at 1300 was 67.7 degrees. Will continue to monitor temperature and humidity

## 2024-01-03 NOTE — Anesthesia Procedure Notes (Addendum)
 Spinal  Patient location during procedure: OR Start time: 01/03/2024 12:27 PM End time: 01/03/2024 12:32 PM Reason for block: surgical anesthesia Staffing Performed: anesthesiologist  Anesthesiologist: Gorman Laughter, MD Performed by: Gorman Laughter, MD Authorized by: Gorman Laughter, MD   Preanesthetic Checklist Completed: patient identified, IV checked, site marked, risks and benefits discussed, surgical consent, monitors and equipment checked, pre-op evaluation and timeout performed Spinal Block Patient position: sitting Prep: DuraPrep and site prepped and draped Patient monitoring: heart rate, continuous pulse ox and blood pressure Approach: midline Location: L3-4 Injection technique: single-shot Needle Needle type: Spinocan  Needle gauge: 25 G Needle length: 9 cm Additional Notes Expiration date of kit checked and confirmed. Patient tolerated procedure well, without complications.

## 2024-01-03 NOTE — Transfer of Care (Signed)
 Immediate Anesthesia Transfer of Care Note  Patient: Suzanne Lane  Procedure(s) Performed: CESAREAN DELIVERY (Abdomen)  Patient Location: PACU  Anesthesia Type:Spinal  Level of Consciousness: awake, alert , and oriented  Airway & Oxygen Therapy: Patient Spontanous Breathing  Post-op Assessment: Report given to RN and Post -op Vital signs reviewed and stable  Post vital signs: Reviewed and stable  Last Vitals:  Vitals Value Taken Time  BP 100/74 01/03/24 1400  Temp    Pulse 66 01/03/24 1400  Resp 18 01/03/24 1400  SpO2 95 % 01/03/24 1400  Vitals shown include unfiled device data.  Last Pain:  Vitals:   01/03/24 1200  TempSrc: Oral  PainSc:          Complications: No notable events documented.

## 2024-01-03 NOTE — Anesthesia Postprocedure Evaluation (Signed)
 Anesthesia Post Note  Patient: Suzanne Lane  Procedure(s) Performed: CESAREAN DELIVERY (Abdomen)     Patient location during evaluation: PACU Anesthesia Type: Spinal Level of consciousness: awake and alert Pain management: pain level controlled Vital Signs Assessment: post-procedure vital signs reviewed and stable Respiratory status: spontaneous breathing Cardiovascular status: stable Anesthetic complications: no   No notable events documented.  Last Vitals:  Vitals:   01/03/24 1500 01/03/24 1524  BP: 98/71 (!) 102/51  Pulse: 68 66  Resp: 17 16  Temp: (!) 36.2 C 36.6 C  SpO2: 94% 94%    Last Pain:  Vitals:   01/03/24 1524  TempSrc: Oral  PainSc:                  Gorman Laughter

## 2024-01-04 ENCOUNTER — Encounter (HOSPITAL_COMMUNITY): Payer: Self-pay | Admitting: Family Medicine

## 2024-01-04 LAB — CBC
HCT: 38.5 % (ref 36.0–46.0)
Hemoglobin: 12.2 g/dL (ref 12.0–15.0)
MCH: 26.8 pg (ref 26.0–34.0)
MCHC: 31.7 g/dL (ref 30.0–36.0)
MCV: 84.6 fL (ref 80.0–100.0)
Platelets: 304 10*3/uL (ref 150–400)
RBC: 4.55 MIL/uL (ref 3.87–5.11)
RDW: 14.1 % (ref 11.5–15.5)
WBC: 15.8 10*3/uL — ABNORMAL HIGH (ref 4.0–10.5)
nRBC: 0 % (ref 0.0–0.2)

## 2024-01-04 LAB — CREATININE, SERUM
Creatinine, Ser: 0.75 mg/dL (ref 0.44–1.00)
GFR, Estimated: >60 mL/min (ref >60)

## 2024-01-04 LAB — GLUCOSE, CAPILLARY: Glucose-Capillary: 134 mg/dL — ABNORMAL HIGH (ref 70–99)

## 2024-01-04 MED ORDER — ETONOGESTREL 68 MG ~~LOC~~ IMPL
68.0000 mg | DRUG_IMPLANT | Freq: Once | SUBCUTANEOUS | Status: DC
  Filled 2024-01-04: qty 1

## 2024-01-04 MED ORDER — LIDOCAINE HCL 1 % IJ SOLN: 0.0000 mL | Freq: Once | INTRAMUSCULAR | Status: DC | PRN

## 2024-01-04 NOTE — Plan of Care (Signed)
  Problem: Education: Goal: Knowledge of General Education information will improve Description: Including pain rating scale, medication(s)/side effects and non-pharmacologic comfort measures Outcome: Progressing   Problem: Health Behavior/Discharge Planning: Goal: Ability to manage health-related needs will improve Outcome: Progressing   Problem: Clinical Measurements: Goal: Ability to maintain clinical measurements within normal limits will improve Outcome: Progressing Goal: Will remain free from infection Outcome: Progressing Goal: Diagnostic test results will improve Outcome: Progressing Goal: Respiratory complications will improve Outcome: Progressing Goal: Cardiovascular complication will be avoided Outcome: Progressing   Problem: Nutrition: Goal: Adequate nutrition will be maintained Outcome: Progressing   Problem: Coping: Goal: Level of anxiety will decrease Outcome: Progressing   Problem: Elimination: Goal: Will not experience complications related to bowel motility Outcome: Progressing Goal: Will not experience complications related to urinary retention Outcome: Progressing   Problem: Pain Managment: Goal: General experience of comfort will improve and/or be controlled Outcome: Progressing   Problem: Safety: Goal: Ability to remain free from injury will improve Outcome: Progressing   Problem: Skin Integrity: Goal: Risk for impaired skin integrity will decrease Outcome: Progressing   Problem: Pain Management: Goal: Relief or control of pain from uterine contractions will improve Outcome: Progressing

## 2024-01-04 NOTE — Progress Notes (Signed)
 CSW received and acknowledges consult for EDPS of 9.  Consult screened out due to 9 on EDPS does not warrant a CSW consult.  MOB whom scores are greater than 9/yes to question 10 on [place] Postpartum Depression Screen warrants a CSW consult.   Enos Fling, Theresia Majors Clinical Social Worker [phone]

## 2024-01-04 NOTE — Lactation Note (Signed)
 This note was copied from a baby's chart.  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 Her plan is to try baby's first PO feeding at the breast; she's also opened to do bottles. Her RN has already set her up with a DEBP but she hasn't had a chance to start pumping yet, she plans to do it today once she gets back to her room. Suzanne Lane was a premature baby herself back in 1986 and she got part of her bowels resected due to NEC. Assisted with breast massage, hand expression and provided a pumping band in size "L" for hands on pumping. Reviewed pumping schedule, pumping log, lactogenesis II, benefits of premature milk and anticipatory guidelines.  OBJECTIVE Infant data: Mother's Current Feeding Choice: Breast Milk and Donor Milk  O2 Device: Room Air  Infant feeding assessment IDFTS - Readiness: 3 IDFTS - Quality: 2   Maternal data: [id number] C-Section, Low Transverse Hand Expression Comments: no colostrum noted yet Significant Breast History:: moderate breast changes during the pregnancy Current breast feeding challenges:: NICU admission Previous breastfeeding challenges?: Low milk supply; Lack of support; Exclusive pump and bottle fed Does the patient have breastfeeding experience prior to this delivery?: Yes How long did the patient breastfeed?: 8 weeks Pumping frequency: Pump was set up at 6 hours post-partum but pumping has not been initiated yet due to mom not feeling well Flange  Size: 24 Hands-free pumping top sizes: Large Martina Sledge) Risk factor for low/delayed milk supply:: GDMA1, BMI = 37, AMA, < 6 lbs, infant separation  Pump: Personal, DEBP (DEBP from insurance [facility]))  ASSESSMENT Infant: Feeding Status: Scheduled 8-11-2-5 Feeding method: Tube/Gavage (Bolus) Nipple Type: Dr. Leticia Raven Preemie  Maternal: R nipple is inverted L nipple is fully everted  INTERVENTIONS/PLAN Interventions: Interventions: Breast feeding basics reviewed; Breast massage; Hand express; Coconut oil; DEBP; Education; [employer] brochure; CDC Guidelines for Breast Pump Cleaning; NICU Pumping Log Tools: Pump; Flanges; Hands-free pumping top; Coconut oil Pump Education: Setup, frequency, and cleaning; Milk Storage  Plan: STS around care times Massage and Hand express both breast prior/after pumping; coconut oil prior pumping Pump every 3 hours for 15 minutes, ideally 8 pumping sessions/24 hours  FOB present. All questions and concerns answered, family to contact [facility] services PRN.  Consult Status: NICU follow-up NICU Follow-up type: New admission follow up   Gray Maugeri S Clementine Cutting 01/04/2024, 12:06 PM

## 2024-01-04 NOTE — Progress Notes (Signed)
 Subjective: Postpartum Day 1: Cesarean Delivery Patient reports tolerating PO.    Objective: Vital signs in last 24 hours: Temp:  [97 F (36.1 C)-98.4 F (36.9 C)] 97.7 F (36.5 C) (05/25 2338) Pulse Rate:  [54-78] 60 (05/26 0150) Resp:  [12-19] 19 (05/26 0421) BP: (89-116)/(51-84) 96/63 (05/26 0147) SpO2:  [93 %-98 %] 98 % (05/26 0420)  Physical Exam:  General: alert, cooperative, and appears stated age Lochia: appropriate Uterine Fundus: firm Incision: healing well DVT Evaluation: No evidence of DVT seen on physical exam.  Recent Labs    01/03/24 1123 01/04/24 0422  HGB 12.7 12.2  HCT 38.7 38.5    Assessment/Plan: Status post Cesarean section. Doing well postoperatively.  Continue current care. DC foley Inpt. Nexplanon  - orders placed Peds has said to maybe hold on circ.  Granville Layer, MD 01/04/2024, 7:23 AM

## 2024-01-05 NOTE — Progress Notes (Signed)
 POSTPARTUM PROGRESS NOTE  POD #2  Subjective:  Suzanne Lane is a 38 y.o. [id number] s/p pLTCS at [redacted]w[redacted]d.  She reports she doing well. No acute events overnight. She reports she is doing well but in a fair amount of pain. She denies any problems with ambulating, voiding or po intake. Denies nausea or vomiting. Pain is moderately controlled.  Lochia is appropriate.  Objective: Blood pressure 118/66, pulse 75, temperature 97.9 F (36.6 C), temperature source Oral, resp. rate 17, height 5\' 2"  (1.575 m), weight 94.8 kg, last menstrual period 05/06/2023, SpO2 99%, unknown if currently breastfeeding.  Physical Exam:  General: alert, cooperative and no distress Chest: no respiratory distress Heart:regular rate, distal pulses intact Abdomen: soft, nontender,  Uterine Fundus: firm, appropriately tender DVT Evaluation: No calf swelling or tenderness Extremities: trace edema Skin: warm, dry; incision clean/dry/intact w/ pressure dressing in place  Recent Labs    01/03/24 1123 01/04/24 0422  HGB 12.7 12.2  HCT 38.7 38.5    Assessment/Plan: Suzanne Lane is a 38 y.o. [id number] s/p pLTCS at [redacted]w[redacted]d for breech presentation.  POD#2 - Doing welll; pain moderately controlled. H/H appropriate  Routine postpartum care  OOB, ambulated  Lovenox for VTE prophylaxis  Contraception: planning nexplanon  Feeding: breast  Dispo: Plan for discharge tomorrow .   LOS: 2 days   Janna Melter, MD Attending Family Medicine Physician, [facility] for [facility], [facility]   01/05/2024, 11:29 AM

## 2024-01-05 NOTE — Lactation Note (Signed)
 This note was copied from a baby's chart.  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 Gillum reported she hasn't initiated pumping yet because she was having some pain; she's planning on start pumping today though. She's expecting to be discharged tomorrow. Reviewed discharge education, pump settings and the importance of consistent pumping for the onset of lactogenesis II and the prevention of engorgement. Parents inquired about galactagogues, revised them and let them know that nipple stimulation is the # 1 guaranteed measurement to ensure a good supply.   OBJECTIVE Infant data: Mother's Current Feeding Choice: Breast Milk and Donor Milk  O2 Device: Room Air  Infant feeding assessment IDFTS - Readiness: 3 IDFTS - Quality: 3   Maternal data: [id number] C-Section, Low Transverse Hand Expression Comments: no colostrum noted yet Significant Breast History:: moderate breast changes during the pregnancy Current breast feeding challenges:: NICU admission Previous breastfeeding challenges?: Low milk supply; Lack of support; Exclusive pump and bottle fed Does the patient have breastfeeding experience prior to this delivery?: Yes How long did the patient breastfeed?: 8 weeks Pumping frequency: Has not initiated pumping yet but voiced she will today on 01/05/2024 Pumped volume: 0 mL Flange Size: 24 Hands-free pumping top sizes: Large Martina Sledge) Risk factor for low/delayed milk supply:: GDMA1, BMI = 37, AMA, < 6 lbs, infant separation  Pump: Personal, DEBP (DEBP from insurance [facility]))  ASSESSMENT Infant: Feeding Status: Scheduled 8-11-2-5 Feeding  method: Tube/Gavage (Bolus) Nipple Type: Dr. Leticia Raven Preemie  Maternal: R nipple is inverted L nipple is fully everted  INTERVENTIONS/PLAN Interventions: Interventions: Breast feeding basics reviewed; DEBP; Education Discharge Education: Engorgement and breast care Tools: Pump; Flanges; Hands-free pumping top; Coconut oil Pump Education: Setup, frequency, and cleaning; Milk Storage  Plan: STS around care times Pump on initiate mode every 3 hours for 15 minutes, ideally 8 pumping sessions/24 hours Switch to maintain once expressing +20 ml of EBM combined Bring all pump pieces to baby's room after her discharge   FOB and big sister present. All questions and concerns answered, family to contact [facility] services PRN.   Consult Status: NICU follow-up NICU Follow-up type: Maternal D/C visit; Verify absence of engorgement; Verify onset of copious milk   Zeidy Tayag S Chellie Vanlue 01/05/2024, 5:12 PM

## 2024-01-05 NOTE — Progress Notes (Signed)

## 2024-01-06 ENCOUNTER — Encounter: Payer: Self-pay | Admitting: Obstetrics and Gynecology

## 2024-01-06 ENCOUNTER — Other Ambulatory Visit (HOSPITAL_COMMUNITY): Payer: Self-pay

## 2024-01-06 ENCOUNTER — Encounter (HOSPITAL_COMMUNITY): Payer: Self-pay | Admitting: Family Medicine

## 2024-01-06 MED ORDER — GABAPENTIN 300 MG PO CAPS
300.0000 mg | ORAL_CAPSULE | Freq: Two times a day (BID) | ORAL | 0 refills | Status: AC
  Filled 2024-01-06: qty 20, 10d supply, fill #0

## 2024-01-06 MED ORDER — POLYETHYLENE GLYCOL 3350 17 GM/SCOOP PO POWD
17.0000 g | ORAL | 0 refills | Status: AC
  Filled 2024-01-06: qty 238, 28d supply, fill #0

## 2024-01-06 MED ORDER — SIMETHICONE 80 MG PO CHEW
80.0000 mg | CHEWABLE_TABLET | ORAL | 0 refills | Status: AC | PRN
  Filled 2024-01-06: qty 30, 8d supply, fill #0

## 2024-01-06 MED ORDER — IBUPROFEN 600 MG PO TABS
600.0000 mg | ORAL_TABLET | Freq: Four times a day (QID) | ORAL | 0 refills | Status: AC | PRN
  Filled 2024-01-06: qty 30, 8d supply, fill #0

## 2024-01-06 MED ORDER — OXYCODONE-ACETAMINOPHEN 5-325 MG PO TABS
1.0000 | ORAL_TABLET | Freq: Four times a day (QID) | ORAL | 0 refills | Status: AC | PRN
  Filled 2024-01-06: qty 30, 4d supply, fill #0

## 2024-01-08 ENCOUNTER — Inpatient Hospital Stay (HOSPITAL_COMMUNITY)
Admission: EM | Admit: 2024-01-08 | Discharge: 2024-01-08 | Disposition: A | Discharge status: Home / Self Care | Attending: Obstetrics and Gynecology | Admitting: Obstetrics and Gynecology

## 2024-01-08 ENCOUNTER — Inpatient Hospital Stay (HOSPITAL_COMMUNITY)

## 2024-01-08 DIAGNOSIS — Z87448 Personal history of other diseases of urinary system: Secondary | ICD-10-CM | POA: Insufficient documentation

## 2024-01-08 DIAGNOSIS — Z8719 Personal history of other diseases of the digestive system: Secondary | ICD-10-CM | POA: Insufficient documentation

## 2024-01-08 DIAGNOSIS — R140 Abdominal distension (gaseous): Secondary | ICD-10-CM | POA: Diagnosis not present

## 2024-01-08 DIAGNOSIS — R109 Unspecified abdominal pain: Secondary | ICD-10-CM | POA: Diagnosis present

## 2024-01-08 DIAGNOSIS — I498 Other specified cardiac arrhythmias: Secondary | ICD-10-CM | POA: Insufficient documentation

## 2024-01-08 DIAGNOSIS — K5901 Slow transit constipation: Secondary | ICD-10-CM

## 2024-01-08 DIAGNOSIS — K56609 Unspecified intestinal obstruction, unspecified as to partial versus complete obstruction: Secondary | ICD-10-CM | POA: Diagnosis not present

## 2024-01-08 DIAGNOSIS — O9963 Diseases of the digestive system complicating the puerperium: Secondary | ICD-10-CM | POA: Diagnosis not present

## 2024-01-08 LAB — URINALYSIS, ROUTINE W REFLEX MICROSCOPIC
Bilirubin Urine: NEGATIVE
Glucose, UA: NEGATIVE mg/dL
Ketones, ur: NEGATIVE mg/dL
Nitrite: NEGATIVE
Protein, ur: NEGATIVE mg/dL
RBC / HPF: >50 RBC/hpf (ref 0–5)
Specific Gravity, Urine: 1.004 — ABNORMAL LOW (ref 1.005–1.030)
pH: 6 (ref 5.0–8.0)

## 2024-01-08 LAB — CBC
HCT: 36.3 % (ref 36.0–46.0)
Hemoglobin: 11.7 g/dL — ABNORMAL LOW (ref 12.0–15.0)
MCH: 27 pg (ref 26.0–34.0)
MCHC: 32.2 g/dL (ref 30.0–36.0)
MCV: 83.8 fL (ref 80.0–100.0)
Platelets: 320 10*3/uL (ref 150–400)
RBC: 4.33 MIL/uL (ref 3.87–5.11)
RDW: 14.5 % (ref 11.5–15.5)
WBC: 9.7 10*3/uL (ref 4.0–10.5)
nRBC: 0 % (ref 0.0–0.2)

## 2024-01-08 LAB — COMPREHENSIVE METABOLIC PANEL WITH GFR
ALT: 41 U/L (ref 0–44)
AST: 33 U/L (ref 15–41)
Albumin: 2.4 g/dL — ABNORMAL LOW (ref 3.5–5.0)
Alkaline Phosphatase: 162 U/L — ABNORMAL HIGH (ref 38–126)
Anion gap: 9 (ref 5–15)
BUN: 13 mg/dL (ref 6–20)
CO2: 26 mmol/L (ref 22–32)
Calcium: 8.6 mg/dL — ABNORMAL LOW (ref 8.9–10.3)
Chloride: 102 mmol/L (ref 98–111)
Creatinine, Ser: 0.71 mg/dL (ref 0.44–1.00)
GFR, Estimated: >60 mL/min (ref >60)
Glucose, Bld: 95 mg/dL (ref 70–99)
Potassium: 4.2 mmol/L (ref 3.5–5.1)
Sodium: 137 mmol/L (ref 135–145)
Total Bilirubin: 0.3 mg/dL (ref 0.0–1.2)
Total Protein: 5.9 g/dL — ABNORMAL LOW (ref 6.5–8.1)

## 2024-01-08 LAB — LIPASE, BLOOD: Lipase: 40 U/L (ref 11–51)

## 2024-01-08 MED ORDER — ACETAMINOPHEN 500 MG PO TABS
1000.0000 mg | ORAL_TABLET | Freq: Once | ORAL | Status: AC
  Administered 2024-01-08: 1000 mg via ORAL
  Filled 2024-01-08: qty 2

## 2024-01-08 MED ORDER — LIDOCAINE VISCOUS HCL 2 % MT SOLN
15.0000 mL | Freq: Once | OROMUCOSAL | Status: AC
  Administered 2024-01-08: 15 mL via ORAL
  Filled 2024-01-08: qty 15

## 2024-01-08 MED ORDER — ALUM & MAG HYDROXIDE-SIMETH 200-200-20 MG/5ML PO SUSP
30.0000 mL | Freq: Once | ORAL | Status: AC
  Administered 2024-01-08: 30 mL via ORAL
  Filled 2024-01-08: qty 30

## 2024-01-08 NOTE — MAU Note (Signed)
 Suzanne Lane is a 38 y.o. at Unknown here in MAU reporting: PP c-section 01/03/24. Pt reprots a back pain that wraps all the way around to her abd. Feels like it could be a sharp gas pain but unable to relieve it . Last BM Tuesday. Has been taking Murelax yesterday and today.  LMP: PP Onset of complaint: today Pain score: 7-10 Vitals:   01/08/24 1552 01/08/24 1610  BP: 130/86 114/80  Pulse: 61 66  Resp: 20   Temp: 98 F (36.7 C)   SpO2: 100%      FHT: n/a  Lab orders placed from triage:

## 2024-01-08 NOTE — MAU Provider Note (Signed)
 Chief Complaint:  Post-op Problem and Abdominal Pain   HPI   None     Suzanne Lane is a 38 y.o. [id number] postpartum female on POD5 after CS who presents to maternity admissions reporting back and abdominal pain. She reports a abdominal pain that wraps around to her back. Pain started in the abdomen and later spread to the back at around 1400 today. She states that it feels like a "gas pain" but she has been unable to relieve it. She endorses nausea at the time pain started while she was eating, denies fever/chills. Her last bowel movement was on 01/05/2024. She took Miralax yesterday and today. She does have a history of small bowel obstruction. She is concerned because she also has a history of kidney issues. She has been taking only ibuprofen  for post-cesarean pain, most recent dose around 0800 today. She has not take any of the Percocet that was prescribed.   Pregnancy Course: Receives care at [facility]. Prenatal records reviewed. Pregnancy complicated by gDM, AMA, BMI 36, PROM.  Past Medical History:  Diagnosis Date   Allergy    Penicillin   Chronic kidney disease    Complicated UTI (urinary tract infection) 02/21/2014   Gestational diabetes 2015   diet controlled   History of gestational diabetes mellitus (GDM) 02/02/2014   Early A1c neg 06/2023     Pyelonephritis affecting pregnancy in third trimester 02/20/2014   IMO SNOMED Dx Update Oct 2024     Small bowel obstruction [facility])    As infant with surgery; recent obstruction 2012   UTI (urinary tract infection) during pregnancy 09/28/2013   RX Keflex  09/28/13  01/16/14: EColi UTI     OB History  Gravida Para Term Preterm AB Living  4 2 1 1 2 2   SAB IAB Ectopic Multiple Live Births  2 0 0 0 2    # Outcome Date GA Lbr Len/2nd Weight Sex Type Anes PTL Lv  4 Preterm 01/03/24 [redacted]w[redacted]d  2360 g M CS-LTranv Spinal  LIV  3 SAB 2023          2 SAB 03/2020 [redacted]w[redacted]d         1 Term 03/30/14 [redacted]w[redacted]d 32:54 / 02:23 3031 g F Vag-Spont EPI  LIV      Birth Comments: caput   Past Surgical History:  Procedure Laterality Date   ABDOMINAL SURGERY     Surgery as infant   CESAREAN SECTION N/A 01/03/2024   Procedure: CESAREAN DELIVERY;  Surgeon: Granville Layer, MD;  Location: [facility] LD ORS;  Service: Obstetrics;  Laterality: N/A;   SMALL INTESTINE SURGERY     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   Family History  Problem Relation Age of Onset   Alcohol abuse Mother    Miscarriages / [place] Mother    Diabetes Father    Alcohol abuse Father    High blood pressure Father    Depression Father    ADD / ADHD Brother    Alcohol abuse Brother    Depression Brother    Sickle cell trait Daughter    Cancer Maternal Aunt    Depression Maternal Aunt    Cancer Maternal Grandfather    Colon cancer Maternal Grandfather    Cancer Paternal Grandfather    Early death Paternal Grandfather    Stomach cancer Neg Hx    Rectal cancer Neg Hx    Esophageal cancer Neg Hx    Social History  Tobacco Use   Smoking status: Former    Current packs/day: 0.00    Average packs/day: 0.5 packs/day for 15.0 years (7.5 ttl pk-yrs)    Types: Cigarettes    Start date: 08/12/2003    Quit date: 09/10/2022    Years since quitting: 1.3   Smokeless tobacco: Never   Tobacco comments:    Smoked 4-5 a day on and off for about 15 years but recently quit.  Vaping Use   Vaping status: Never Used  Substance Use Topics   Alcohol use: Not Currently    Alcohol/week: 3.0 standard drinks of alcohol    Types: 2 Glasses of wine, 1 Cans of beer per week   Drug use: No   Allergies  Allergen Reactions   Penicillins Hives    Can take cephalosporins- no reaction   Medications Prior to Admission  Medication Sig Dispense Refill Last Dose/Taking   ibuprofen  (ADVIL ) 600 MG tablet Take 1 tablet (600 mg total) by mouth every 6 (six) hours as needed. 30 tablet 0 01/08/2024 at  8:00 AM   clobetasol  ointment (TEMOVATE ) 0.05 %  Apply 1 Application topically 2 (two) times daily. (Patient not taking: Reported on 12/29/2023) 30 g 0    gabapentin  (NEURONTIN ) 300 MG capsule Take 1 capsule (300 mg total) by mouth 2 (two) times daily for 10 days. 20 capsule 0    oxyCODONE -acetaminophen  (PERCOCET/ROXICET) 5-325 MG tablet Take 1-2 tablets by mouth every 6 (six) hours as needed. 30 tablet 0    polyethylene glycol powder (GLYCOLAX /MIRALAX ) 17 GM/SCOOP powder Take 17 g by mouth every other day for 7 doses. 238 g 0    prenatal vitamin w/FE, FA (PRENATAL 1 + 1) 27-1 MG TABS tablet Take 1 tablet by mouth daily. 30 tablet 12    simethicone  (MYLICON) 80 MG chewable tablet Chew 1 tablet (80 mg total) by mouth as needed for flatulence. 30 tablet 0    terconazole  (TERAZOL 7 ) 0.4 % vaginal cream Place 1 applicator vaginally at bedtime. 45 g 0     I have reviewed patient's Past Medical Hx, Surgical Hx, Family Hx, Social Hx, medications and allergies.   ROS  Pertinent items noted in HPI and remainder of comprehensive ROS otherwise negative.   PHYSICAL EXAM  Patient Vitals for the past 24 hrs:  BP Temp Pulse Resp SpO2  01/08/24 1610 114/80 -- 66 -- --  01/08/24 1552 130/86 98 F (36.7 C) 61 20 100 %    Constitutional: Well-developed, well-nourished female in no acute distress.  HEENT: atraumatic, normocephalic. Neck has normal ROM. EOM intact. Cardiovascular: normal rate & rhythm, warm and well-perfused Respiratory: normal effort, no problems with respiration noted GI: TTP throughout, particularly in RLQ and periumbilical area. Distended. MSK: Extremities nontender, no edema, normal ROM Skin: warm and dry. Acyanotic, no jaundice or pallor. Neurologic: Alert and oriented x 4. No abnormal coordination. Psychiatric: Normal mood. Speech not slurred, not rapid/pressured. Patient is cooperative. GU: no CVA tenderness  Labs: Results for orders placed or performed during the hospital encounter of 01/08/24 (from the past 24 hours)   Urinalysis, Routine w reflex microscopic -Urine, Clean Catch     Status: Abnormal   Collection Time: 01/08/24  4:41 PM  Result Value Ref Range   Color, Urine YELLOW YELLOW   APPearance HAZY (A) CLEAR   Specific Gravity, Urine 1.004 (L) 1.005 - 1.030   pH 6.0 5.0 - 8.0   Glucose, UA NEGATIVE NEGATIVE mg/dL   Hgb urine dipstick LARGE (A) NEGATIVE  Bilirubin Urine NEGATIVE NEGATIVE   Ketones, ur NEGATIVE NEGATIVE mg/dL   Protein, ur NEGATIVE NEGATIVE mg/dL   Nitrite NEGATIVE NEGATIVE   Leukocytes,Ua MODERATE (A) NEGATIVE   RBC / HPF >50 0 - 5 RBC/hpf   WBC, UA 21-50 0 - 5 WBC/hpf   Bacteria, UA RARE (A) NONE SEEN   Squamous Epithelial / HPF 0-5 0 - 5 /HPF   Mucus PRESENT   Comprehensive metabolic panel     Status: Abnormal   Collection Time: 01/08/24  4:47 PM  Result Value Ref Range   Sodium 137 135 - 145 mmol/L   Potassium 4.2 3.5 - 5.1 mmol/L   Chloride 102 98 - 111 mmol/L   CO2 26 22 - 32 mmol/L   Glucose, Bld 95 70 - 99 mg/dL   BUN 13 6 - 20 mg/dL   Creatinine, Ser 2.95 0.44 - 1.00 mg/dL   Calcium  8.6 (L) 8.9 - 10.3 mg/dL   Total Protein 5.9 (L) 6.5 - 8.1 g/dL   Albumin 2.4 (L) 3.5 - 5.0 g/dL   AST 33 15 - 41 U/L   ALT 41 0 - 44 U/L   Alkaline Phosphatase 162 (H) 38 - 126 U/L   Total Bilirubin 0.3 0.0 - 1.2 mg/dL   GFR, Estimated >62 >13 mL/min   Anion gap 9 5 - 15  CBC     Status: Abnormal   Collection Time: 01/08/24  4:47 PM  Result Value Ref Range   WBC 9.7 4.0 - 10.5 K/uL   RBC 4.33 3.87 - 5.11 MIL/uL   Hemoglobin 11.7 (L) 12.0 - 15.0 g/dL   HCT 08.6 57.8 - 46.9 %   MCV 83.8 80.0 - 100.0 fL   MCH 27.0 26.0 - 34.0 pg   MCHC 32.2 30.0 - 36.0 g/dL   RDW 62.9 52.8 - 41.3 %   Platelets 320 150 - 400 K/uL   nRBC 0.0 0.0 - 0.2 %  Lipase, blood     Status: None   Collection Time: 01/08/24  4:47 PM  Result Value Ref Range   Lipase 40 11 - 51 U/L    Imaging:  DG Abd 2 Views Result Date: 01/08/2024 EXAM: 2 VIEW XRAY OF THE ABDOMEN SUPINE 01/08/2024 05:40:00 PM  COMPARISON: None available. CLINICAL HISTORY: Abdominal pain, post c-section, abdominal distension, history of small bowel obstruction. FINDINGS: BOWEL: Nonobstructive bowel gas pattern. Moderate stool in the right colon. PERITONEUM AND SOFT TISSUES: No free air on the upright view. BONES: No acute osseous abnormality. IMPRESSION: 1. No free air. 2. Moderate stool in the right colon. Electronically signed by: Zadie Herter MD 01/08/2024 07:20 PM EDT RP Workstation: [id number]    EKG: normal EKG, normal sinus rhythm, there are no previous tracings available for comparison. No STEMI. I personally reviewed and interpreted this EKG.   MDM & MAU COURSE  MDM: High  MAU Course: -Vital signs within normal limits. No tachycardia or fever. -Tylenol  for pain while awaiting results. -UA to rule out UTI/pyelonephritis. -CBC and CMP for kidney, liver function and electrolytes. -Lipase to rule out pancreatitis. -Abdominal x-ray to rule out small bowel obstruction. -EKG to rule out ACS given risk factors: gDM, HLD, BMI >35, recent pregnancy. Atypical presentation more likely in females. EKG negative for STEMI. -UA negative for infection, positive for blood and elevated specific gravity. Hgb has been positive on UA since 2022, she has a history of CKD. Renal function stable today, GFR >60 with normal BUN and Cr. -Discussed trial of GI  cocktail for possible gastritis/ulcer induced by ibuprofen  use versus repeat MiraLax or enema for constipation. Patient would like to start with GI cocktail. -Personally reviewed images and agree with radiology interpretation: no evidence of small bowel obstruction. There is evidence of constipation. -Given imaging results, revisited MiraLax or enema with minimal improvement after GI cocktail. Patient prefers to go home and follow recommendations to induce bowel movement there.  Differential diagnosis considered for abdominal pain includes but is not limited to: UTI,  pyelonephritis, constipation, pancreatitis, cholecystitis, appendicitis, cervicitis, ACS  Orders Placed This Encounter  Procedures   DG Abd 2 Views   Urinalysis, Routine w reflex microscopic -Urine, Clean Catch   Comprehensive metabolic panel   CBC   Lipase, blood   EKG 12-Lead   Discharge patient   Meds ordered this encounter  Medications   acetaminophen  (TYLENOL ) tablet 1,000 mg   AND Linked Order Group    alum & mag hydroxide-simeth (MAALOX/MYLANTA) 200-200-20 MG/5ML suspension 30 mL    lidocaine  (XYLOCAINE ) 2 % viscous mouth solution 15 mL    ASSESSMENT   1. Slow transit constipation   2. Postpartum state     PLAN  Discharge home in stable condition with return precautions.  Recommend measures at home for constipation: 96 ounces fluid daily, MiraLax TID for the next 2 days then daily for maintenance, prune or apple juice.   Follow-up Information     [facility] Follow up.   Specialty: Obstetrics and Gynecology Why: As scheduled for postpartum follow up Contact information: [address] [place]  [phone]                 Allergies as of 01/08/2024       Reactions   Penicillins Hives   Can take cephalosporins- no reaction        Medication List     TAKE these medications    clobetasol  ointment 0.05 % Commonly known as: TEMOVATE  Apply 1 Application topically 2 (two) times daily.   gabapentin 300 MG capsule Commonly known as: NEURONTIN Take 1 capsule (300 mg total) by mouth 2 (two) times daily for 10 days.   ibuprofen  600 MG tablet Commonly known as: ADVIL  Take 1 tablet (600 mg total) by mouth every 6 (six) hours as needed.   oxyCODONE -acetaminophen  5-325 MG tablet Commonly known as: PERCOCET/ROXICET Take 1-2 tablets by mouth every 6 (six) hours as needed.   polyethylene glycol powder 17 GM/SCOOP powder Commonly known as: GLYCOLAX/MIRALAX Take 17 g by  mouth every other day for 7 doses.   prenatal vitamin w/FE, FA 27-1 MG Tabs tablet Take 1 tablet by mouth daily.   simethicone  80 MG chewable tablet Commonly known as: MYLICON Chew 1 tablet (80 mg total) by mouth as needed for flatulence.   terconazole  0.4 % vaginal cream Commonly known as: TERAZOL 7  Place 1 applicator vaginally at bedtime.        Noreene Bearded, PA

## 2024-01-08 NOTE — ED Triage Notes (Signed)
 MAU contacted and advised to send pt to that unit. Pt transported via wheelchair.

## 2024-01-08 NOTE — ED Triage Notes (Signed)
 Pt had c section 5 days ago and complaining of abd pain radiating through to back.

## 2024-01-08 NOTE — Discharge Instructions (Signed)
 Return to the MAU for: -Worsening pain not improved by Tylenol . -Fever more than 100.33F -Chest pain or shortness of breath.  PAIN: I would recommend using Tylenol  more often than ibuprofen . Tylenol  is processed by the liver, but ibuprofen  is processed by the kidneys.  CONSTIPATION: This can be absolutely miserable. I recommend the following: -MiraLax 3 times daily for the next 2-3 days. Once you have a bowel movement, continue either MiraLax or Colace once daily for about a week. -Ensure you are drinking at least 96 ounces of fluids each day. -Prune juice or apple juice can be helpful as well. -If none of these work, you can try an enema at home or come back the MAU for an enema here.

## 2024-01-11 ENCOUNTER — Inpatient Hospital Stay (HOSPITAL_COMMUNITY)

## 2024-01-11 ENCOUNTER — Encounter: Admitting: Family Medicine

## 2024-01-11 ENCOUNTER — Other Ambulatory Visit: Payer: Self-pay

## 2024-01-11 ENCOUNTER — Inpatient Hospital Stay (HOSPITAL_COMMUNITY)
Admission: AD | Admit: 2024-01-11 | Discharge: 2024-01-11 | Disposition: A | Discharge status: Home / Self Care | Attending: Obstetrics and Gynecology | Admitting: Obstetrics and Gynecology

## 2024-01-11 ENCOUNTER — Encounter (HOSPITAL_COMMUNITY): Payer: Self-pay | Admitting: Obstetrics and Gynecology

## 2024-01-11 DIAGNOSIS — M6208 Separation of muscle (nontraumatic), other site: Secondary | ICD-10-CM | POA: Insufficient documentation

## 2024-01-11 DIAGNOSIS — K5901 Slow transit constipation: Secondary | ICD-10-CM | POA: Diagnosis not present

## 2024-01-11 DIAGNOSIS — R1013 Epigastric pain: Secondary | ICD-10-CM | POA: Diagnosis not present

## 2024-01-11 DIAGNOSIS — N200 Calculus of kidney: Secondary | ICD-10-CM | POA: Diagnosis not present

## 2024-01-11 LAB — URINALYSIS, ROUTINE W REFLEX MICROSCOPIC
Bilirubin Urine: NEGATIVE
Glucose, UA: NEGATIVE mg/dL
Ketones, ur: NEGATIVE mg/dL
Nitrite: NEGATIVE
Protein, ur: NEGATIVE mg/dL
RBC / HPF: >50 RBC/hpf (ref 0–5)
Specific Gravity, Urine: 1.013 (ref 1.005–1.030)
pH: 7 (ref 5.0–8.0)

## 2024-01-11 LAB — COMPREHENSIVE METABOLIC PANEL WITH GFR
ALT: 31 U/L (ref 0–44)
AST: 22 U/L (ref 15–41)
Albumin: 2.7 g/dL — ABNORMAL LOW (ref 3.5–5.0)
Alkaline Phosphatase: 178 U/L — ABNORMAL HIGH (ref 38–126)
Anion gap: 7 (ref 5–15)
BUN: 11 mg/dL (ref 6–20)
CO2: 26 mmol/L (ref 22–32)
Calcium: 8.7 mg/dL — ABNORMAL LOW (ref 8.9–10.3)
Chloride: 102 mmol/L (ref 98–111)
Creatinine, Ser: 0.81 mg/dL (ref 0.44–1.00)
GFR, Estimated: >60 mL/min (ref >60)
Glucose, Bld: 106 mg/dL — ABNORMAL HIGH (ref 70–99)
Potassium: 4.6 mmol/L (ref 3.5–5.1)
Sodium: 135 mmol/L (ref 135–145)
Total Bilirubin: 0.5 mg/dL (ref 0.0–1.2)
Total Protein: 6.6 g/dL (ref 6.5–8.1)

## 2024-01-11 LAB — CBC
HCT: 41.1 % (ref 36.0–46.0)
Hemoglobin: 13.2 g/dL (ref 12.0–15.0)
MCH: 26.9 pg (ref 26.0–34.0)
MCHC: 32.1 g/dL (ref 30.0–36.0)
MCV: 83.7 fL (ref 80.0–100.0)
Platelets: 362 10*3/uL (ref 150–400)
RBC: 4.91 MIL/uL (ref 3.87–5.11)
RDW: 14.5 % (ref 11.5–15.5)
WBC: 11.6 10*3/uL — ABNORMAL HIGH (ref 4.0–10.5)
nRBC: 0 % (ref 0.0–0.2)

## 2024-01-11 LAB — LIPASE, BLOOD: Lipase: 33 U/L (ref 11–51)

## 2024-01-11 MED ORDER — KETOROLAC TROMETHAMINE 10 MG PO TABS: 10.0000 mg | ORAL_TABLET | Freq: Four times a day (QID) | ORAL | 0 refills | Status: AC | PRN

## 2024-01-11 MED ORDER — LIDOCAINE VISCOUS HCL 2 % MT SOLN
15.0000 mL | Freq: Once | OROMUCOSAL | Status: AC
  Administered 2024-01-11: 15 mL via ORAL
  Filled 2024-01-11: qty 15

## 2024-01-11 MED ORDER — ALUM & MAG HYDROXIDE-SIMETH 200-200-20 MG/5ML PO SUSP
30.0000 mL | Freq: Once | ORAL | Status: AC
  Administered 2024-01-11: 30 mL via ORAL
  Filled 2024-01-11: qty 30

## 2024-01-11 MED ORDER — KETOROLAC TROMETHAMINE 30 MG/ML IJ SOLN
30.0000 mg | Freq: Once | INTRAMUSCULAR | Status: AC
  Administered 2024-01-11: 30 mg via INTRAMUSCULAR
  Filled 2024-01-11: qty 1

## 2024-01-11 NOTE — MAU Provider Note (Signed)
 Chief Complaint:  Abdominal Pain   HPI   None     Suzanne Lane is a 38 y.o. [id number] at Unknown who presents to maternity admissions reporting midline abdominal pain.  Describes pain as intermittent, 10/10 when present.  She reports associated nausea and cold sweats.  She was in the MAU on 01/08/2024 with similar complaints, workup at that time UA, CBC, CMP, lipase, EKG, abdominal x-ray negative.  Did not want an enema while in the MAU that day, opted for hydration, MiraLAX  and Colace, prune juice at home to induce bowel movement. She has had several bowel movements since that time. No new symptoms since MAU visit on 5/30, abdominal pain has worsened. Nausea is still present, no episodes of emesis.  Of note, she does have a history of small bowel obstruction.  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  Pregnancy Course: Receives care at [facility]. Prenatal records reviewed. Pregnancy complicated by gDM, AMA, BMI 36, PROM.  Past Medical History:  Diagnosis Date   Allergy    Penicillin   Chronic kidney disease    Complicated UTI (urinary tract infection) 02/21/2014   Gestational diabetes 2015   diet controlled   History of gestational diabetes mellitus (GDM) 02/02/2014   Early A1c neg 06/2023     Pyelonephritis affecting pregnancy in third trimester 02/20/2014   IMO SNOMED Dx Update Oct 2024     Small bowel obstruction [facility])    As infant with surgery; recent obstruction 2012   UTI (urinary tract infection) during pregnancy 09/28/2013   RX Keflex  09/28/13  01/16/14: EColi UTI     OB History  Gravida Para Term Preterm AB Living  4 2 1 1 2 2   SAB IAB Ectopic Multiple Live Births  2 0 0 0 2    # Outcome Date GA Lbr Len/2nd Weight Sex Type Anes PTL Lv  4 Preterm 01/03/24 [redacted]w[redacted]d  2360 g M CS-LTranv Spinal  LIV  3 SAB 2023          2 SAB 03/2020 [redacted]w[redacted]d         1 Term 03/30/14 [redacted]w[redacted]d 32:54 / 02:23 3031 g F Vag-Spont EPI  LIV      Birth Comments: caput   Past Surgical History:  Procedure Laterality Date   ABDOMINAL SURGERY     Surgery as infant   CESAREAN SECTION N/A 01/03/2024   Procedure: CESAREAN DELIVERY;  Surgeon: Granville Layer, MD;  Location: [facility] LD ORS;  Service: Obstetrics;  Laterality: N/A;   SMALL INTESTINE SURGERY     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   Family History  Problem Relation Age of Onset   Alcohol abuse Mother    Miscarriages / [place] Mother    Diabetes Father    Alcohol abuse Father    High blood pressure Father    Depression Father    ADD / ADHD Brother    Alcohol abuse Brother    Depression Brother    Sickle cell trait Daughter    Cancer Maternal Aunt    Depression Maternal Aunt    Cancer Maternal Grandfather    Colon cancer Maternal Grandfather    Cancer Paternal Grandfather    Early death Paternal Grandfather    Stomach cancer Neg Hx    Rectal cancer Neg Hx    Esophageal cancer Neg Hx    Social History   Tobacco  Use   Smoking status: Former    Current packs/day: 0.00    Average packs/day: 0.5 packs/day for 15.0 years (7.5 ttl pk-yrs)    Types: Cigarettes    Start date: 08/12/2003    Quit date: 09/10/2022    Years since quitting: 1.3   Smokeless tobacco: Never   Tobacco comments:    Smoked 4-5 a day on and off for about 15 years but recently quit.  Vaping Use   Vaping status: Never Used  Substance Use Topics   Alcohol use: Not Currently    Alcohol/week: 3.0 standard drinks of alcohol    Types: 2 Glasses of wine, 1 Cans of beer per week   Drug use: No   Allergies  Allergen Reactions   Penicillins Hives    Can take cephalosporins- no reaction   Medications Prior to Admission  Medication Sig Dispense Refill Last Dose/Taking   ibuprofen  (ADVIL ) 600 MG tablet Take 1 tablet (600 mg total) by mouth every 6 (six) hours as needed. 30 tablet 0 01/10/2024   simethicone  (MYLICON) 80 MG chewable tablet Chew 1  tablet (80 mg total) by mouth as needed for flatulence. 30 tablet 0 01/10/2024   clobetasol  ointment (TEMOVATE ) 0.05 % Apply 1 Application topically 2 (two) times daily. (Patient not taking: Reported on 12/29/2023) 30 g 0    gabapentin  (NEURONTIN ) 300 MG capsule Take 1 capsule (300 mg total) by mouth 2 (two) times daily for 10 days. 20 capsule 0    oxyCODONE -acetaminophen  (PERCOCET/ROXICET) 5-325 MG tablet Take 1-2 tablets by mouth every 6 (six) hours as needed. 30 tablet 0    polyethylene glycol powder (GLYCOLAX /MIRALAX ) 17 GM/SCOOP powder Take 17 g by mouth every other day for 7 doses. 238 g 0    prenatal vitamin w/FE, FA (PRENATAL 1 + 1) 27-1 MG TABS tablet Take 1 tablet by mouth daily. 30 tablet 12    terconazole  (TERAZOL 7 ) 0.4 % vaginal cream Place 1 applicator vaginally at bedtime. 45 g 0     I have reviewed patient's Past Medical Hx, Surgical Hx, Family Hx, Social Hx, medications and allergies.   ROS  Pertinent items noted in HPI and remainder of comprehensive ROS otherwise negative.   PHYSICAL EXAM  Patient Vitals for the past 24 hrs:  BP Temp Pulse Resp SpO2  01/11/24 1007 123/76 97.8 F (36.6 C) 63 16 97 %    Constitutional: Well-developed, well-nourished female in no acute distress.  HEENT: atraumatic, normocephalic. Neck has normal ROM. EOM intact. Cardiovascular: normal rate & rhythm, warm and well-perfused Respiratory: normal effort, no problems with respiration noted GI: Abd distended, TTP all 4 quadrants. No rebound or guarding. Normoactive bowel sounds. Low transverse incision well healed, no palpable hernia. MSK: Extremities nontender, no edema, normal ROM Skin: warm and dry. Acyanotic, no jaundice or pallor. Neurologic: Alert and oriented x 4. No abnormal coordination. Psychiatric: Normal mood. Speech not slurred, not rapid/pressured. Patient is cooperative. GU: mild CVA tenderness    Labs: Results for orders placed or performed during the hospital encounter of  01/11/24 (from the past 24 hours)  Urinalysis, Routine w reflex microscopic -Urine, Clean Catch     Status: Abnormal   Collection Time: 01/11/24 10:30 AM  Result Value Ref Range   Color, Urine YELLOW YELLOW   APPearance CLEAR CLEAR   Specific Gravity, Urine 1.013 1.005 - 1.030   pH 7.0 5.0 - 8.0   Glucose, UA NEGATIVE NEGATIVE mg/dL   Hgb urine dipstick LARGE (A) NEGATIVE  Bilirubin Urine NEGATIVE NEGATIVE   Ketones, ur NEGATIVE NEGATIVE mg/dL   Protein, ur NEGATIVE NEGATIVE mg/dL   Nitrite NEGATIVE NEGATIVE   Leukocytes,Ua MODERATE (A) NEGATIVE   RBC / HPF >50 0 - 5 RBC/hpf   WBC, UA 11-20 0 - 5 WBC/hpf   Bacteria, UA RARE (A) NONE SEEN   Squamous Epithelial / HPF 0-5 0 - 5 /HPF   Mucus PRESENT   Comprehensive metabolic panel     Status: Abnormal   Collection Time: 01/11/24 10:58 AM  Result Value Ref Range   Sodium 135 135 - 145 mmol/L   Potassium 4.6 3.5 - 5.1 mmol/L   Chloride 102 98 - 111 mmol/L   CO2 26 22 - 32 mmol/L   Glucose, Bld 106 (H) 70 - 99 mg/dL   BUN 11 6 - 20 mg/dL   Creatinine, Ser 1.61 0.44 - 1.00 mg/dL   Calcium  8.7 (L) 8.9 - 10.3 mg/dL   Total Protein 6.6 6.5 - 8.1 g/dL   Albumin 2.7 (L) 3.5 - 5.0 g/dL   AST 22 15 - 41 U/L   ALT 31 0 - 44 U/L   Alkaline Phosphatase 178 (H) 38 - 126 U/L   Total Bilirubin 0.5 0.0 - 1.2 mg/dL   GFR, Estimated >09 >60 mL/min   Anion gap 7 5 - 15  Lipase, blood     Status: None   Collection Time: 01/11/24 10:58 AM  Result Value Ref Range   Lipase 33 11 - 51 U/L  CBC     Status: Abnormal   Collection Time: 01/11/24 10:58 AM  Result Value Ref Range   WBC 11.6 (H) 4.0 - 10.5 K/uL   RBC 4.91 3.87 - 5.11 MIL/uL   Hemoglobin 13.2 12.0 - 15.0 g/dL   HCT 45.4 09.8 - 11.9 %   MCV 83.7 80.0 - 100.0 fL   MCH 26.9 26.0 - 34.0 pg   MCHC 32.1 30.0 - 36.0 g/dL   RDW 14.7 82.9 - 56.2 %   Platelets 362 150 - 400 K/uL   nRBC 0.0 0.0 - 0.2 %    Imaging:  CT PELVIS WO CONTRAST Result Date: 01/11/2024 CLINICAL DATA:  Abdominal  pain. Ventral hernia containing small bowel on abdominal CT. Recent Caesarean section. EXAM: CT PELVIS WITHOUT CONTRAST TECHNIQUE: Multidetector CT imaging of the pelvis was performed following the standard protocol without intravenous contrast. RADIATION DOSE REDUCTION: This exam was performed according to the departmental dose-optimization program which includes automated exposure control, adjustment of the mA and/or kV according to patient size and/or use of iterative reconstruction technique. COMPARISON:  Abdominal CT earlier today FINDINGS: Urinary Tract: Unremarkable appearance of the urinary bladder. No bladder wall thickening. Bowel: Rectus diastasis. Loop of small bowel extends into the diastatic segment. This bowel loop is fluid-filled with adjacent free fluid, but no abnormal distension. There is no proximal small bowel distension. Moderate volume of stool throughout the included colon. Normal appendix. Vascular/Lymphatic: No pelvic adenopathy. Unenhanced vasculature is unremarkable. Reproductive: Enlarged postpartum appearance of the uterus which courses into the right abdomen. Other: Abdominal rectus diastasis. Edema in the subcutaneous tissues with scar from recent Caesarean section. Trace free fluid in the dependent pelvis. No free intra-abdominal air. Musculoskeletal: There are no acute or suspicious osseous abnormalities. IMPRESSION: 1. Abdominal rectus diastasis. Loop of small bowel extends into the diastatic segment. This bowel loop is fluid-filled with adjacent free fluid, but no abnormal distension. Findings may be due to adhesions. No evidence of small-bowel obstruction on  this pelvic exam, no small bowel distension on abdominal portion earlier today. 2. Enlarged postpartum appearance of the uterus which courses into the right abdomen. Electronically Signed   By: Chadwick Colonel M.D.   On: 01/11/2024 15:34   CT ABDOMEN WO CONTRAST Result Date: 01/11/2024 CLINICAL DATA:  Epigastric pain,  upper abdominal pain EXAM: CT ABDOMEN WITHOUT CONTRAST TECHNIQUE: Multidetector CT imaging of the abdomen was performed following the standard protocol without IV contrast. RADIATION DOSE REDUCTION: This exam was performed according to the departmental dose-optimization program which includes automated exposure control, adjustment of the mA and/or kV according to patient size and/or use of iterative reconstruction technique. COMPARISON:  07/10/2011 FINDINGS: Lower chest: No acute abnormality Hepatobiliary: No focal hepatic abnormality. Gallbladder unremarkable. Pancreas: No focal abnormality or ductal dilatation. Spleen: No focal abnormality.  Normal size. Adrenals/Urinary Tract: 3 mm nonobstructing stone in the lower pole of the right kidney. No hydronephrosis or visible proximal ureteral stones. Adrenal glands normal. Stomach/Bowel: Partially visualized on the lowest images in the upper pelvis appears to be a midline ventral hernia containing small bowel loops, but this is incompletely imaged on this abdominal only CT. Continuation through the pelvis would be helpful. Moderate stool burden in the visualized colon. No definite small bowel distension to suggest bowel obstruction. Stomach is unremarkable. Vascular/Lymphatic: No evidence of aneurysm or adenopathy. Other: Prominent uterus partially visualized in the upper pelvis, likely postpartum. No free fluid or free air. Musculoskeletal: No acute bony abnormality. IMPRESSION: Partially visualized on the lowest images in the upper pelvis appears to be a midline ventral hernia containing small bowel loops. No evidence for small bowel obstruction. Continue a shin of the scan through the pelvis would be helpful to completely visualize this area as well as pelvic bowel loops. Right lower pole nephrolithiasis. No hydronephrosis. Moderate stool burden throughout the colon. Electronically Signed   By: Janeece Mechanic M.D.   On: 01/11/2024 13:05    MDM & MAU COURSE  MDM:  High  MAU Course: -Vital signs within normal limits. Afebrile and normotensive. -GI cocktail most helpful during last MAU visit, repeat GI cocktail. Toradol  for pain relief while awaiting results. -Repeat CMP, CBC, lipase for any changes indicated possible appendicitis, cholecystitis, pancreatitis. -CT abdomen for possible nephrolithiasis.  -UA positive for blood. -CBC shows leukocytosis, otherwise within normal limits.  -CMP with elevated but fairly stable alkaline phosphatase. Otherwise within normal limits. Lipase within normal limits.  -CT abdomen shows 3mm stone in right kidney. Upper pelvis with midline ventral hernia containing small bowel loops. Incompletely imaged, recommend CT pelvis for complete visualization. Moderate stool burden throughout the colon. -Discussed CT results, patient agreeable to pelvic CT. Also agreeable to enema to alleviate some constipation/stool burden. -Pelvic CT shows abdominal rectus diastasis with small bowel loop extending into diastatic segment. No evidence of small bowel obstruction.   Orders Placed This Encounter  Procedures   CT ABDOMEN WO CONTRAST   CT PELVIS WO CONTRAST   Urinalysis, Routine w reflex microscopic -Urine, Clean Catch   Comprehensive metabolic panel   Lipase, blood   CBC   Soap suds enema   Discharge patient   Meds ordered this encounter  Medications   AND Linked Order Group    alum & mag hydroxide-simeth (MAALOX/MYLANTA) 200-200-20 MG/5ML suspension 30 mL    lidocaine  (XYLOCAINE ) 2 % viscous mouth solution 15 mL   ketorolac  (TORADOL ) 30 MG/ML injection 30 mg   ketorolac  (TORADOL ) 10 MG tablet    Sig: Take 1 tablet (10  mg total) by mouth every 6 (six) hours as needed. DO NOT TAKE IBUPROFEN  WHILE TAKING THIS MEDICATION.    Dispense:  20 tablet    Refill:  0    ASSESSMENT   1. Slow transit constipation   2. Diastasis of rectus abdominis   3. Postpartum state     PLAN  Discharge home in stable condition with return  precautions.  Recommend MiraLax  TID for the next 2 days, then once daily for the next 1-2 weeks. Increase oral hydration to improve constipation and help passage of renal stone if that is also contributing to pain. Pain management with alternating ibuprofen  and Tylenol     Follow-up Information     [facility] Follow up.   Specialty: Obstetrics and Gynecology Why: As scheduled for postpartum follow up Contact information: [address] [place]  [id number] [phone]                 Allergies as of 01/11/2024       Reactions   Penicillins Hives   Can take cephalosporins- no reaction        Medication List     STOP taking these medications    clobetasol  ointment 0.05 % Commonly known as: TEMOVATE        TAKE these medications    gabapentin  300 MG capsule Commonly known as: NEURONTIN  Take 1 capsule (300 mg total) by mouth 2 (two) times daily for 10 days.   ibuprofen  600 MG tablet Commonly known as: ADVIL  Take 1 tablet (600 mg total) by mouth every 6 (six) hours as needed.   ketorolac  10 MG tablet Commonly known as: TORADOL  Take 1 tablet (10 mg total) by mouth every 6 (six) hours as needed. DO NOT TAKE IBUPROFEN  WHILE TAKING THIS MEDICATION.   oxyCODONE -acetaminophen  5-325 MG tablet Commonly known as: PERCOCET/ROXICET Take 1-2 tablets by mouth every 6 (six) hours as needed.   polyethylene glycol powder 17 GM/SCOOP powder Commonly known as: GLYCOLAX /MIRALAX  Take 17 g by mouth every other day for 7 doses.   prenatal vitamin w/FE, FA 27-1 MG Tabs tablet Take 1 tablet by mouth daily.   simethicone  80 MG chewable tablet Commonly known as: MYLICON Chew 1 tablet (80 mg total) by mouth as needed for flatulence.   terconazole  0.4 % vaginal cream Commonly known as: TERAZOL 7  Place 1 applicator vaginally at bedtime.        Noreene Bearded, PA

## 2024-01-11 NOTE — Discharge Instructions (Signed)
 PAIN: You can alternate Tylenol  and the pain medicine ketorolac  that I sent to the pharmacy for you.  CONSTIPATION: For the next 2 days, take MiraLax  bottle directs 3 times a day. I would expect at least 2-3 bowel movements each day for a few days until the excess stool is gone. After that, take it once a day to maintain consistent bowel movements. Aim for at least 96 ounces of water  intake each day to help flush the stool from your system. This will also help with any kidney stone.  DIASTASIS RECTI: I included a handout with additional information. Your bowels are putting pressure on the thin segment of your belly wall which is likely contributing to your pain. Once the constipation has improved and there is not so much stool putting pressure on your belly, I am hopeful your pain will improve. If you want a referral to physical therapy to help the gap close, let your OB know. There are also videos with exercises to follow online.

## 2024-01-11 NOTE — MAU Note (Signed)
.  Suzanne Lane is a 38 y.o. at Unknown here in MAU reporting: Patient reports midline abdominal pain from chest to pubic bone. Pain is intermittent 10/10. Patient visually in pain in triage  Patient reports nausea and cold sweat with the pain  Onset of complaint: today  Pain score: 10/10 There were no vitals filed for this visit.    Lab orders placed from triage:   ua

## 2024-01-12 ENCOUNTER — Ambulatory Visit

## 2024-01-12 VITALS — BP 120/71 | HR 73 | Wt 197.7 lb

## 2024-01-12 DIAGNOSIS — Z4889 Encounter for other specified surgical aftercare: Secondary | ICD-10-CM

## 2024-01-12 NOTE — Progress Notes (Unsigned)
 Incision Check Visit  Suzanne Lane is here for incision check following primary c-section on 01/03/24.  She reports feeling well, minimal pain.  Assessment:  Incision site approximated, dry, no tenderness,or signs of infection.  Assisted pt with removing adhesive residue.    Education: Reviewed good wound care and s/s of infection with patient.  Patient will follow up post partum visit 02/18/24.  Pt verbalized understanding with no further questions  Ninette Basque, RN 01/12/2024  3:02 PM

## 2024-01-13 ENCOUNTER — Ambulatory Visit (HOSPITAL_COMMUNITY): Payer: Self-pay

## 2024-01-13 NOTE — Lactation Note (Addendum)
 This note was copied from a baby's chart. Lactation Consultation Note  Patient Name: Suzanne Lane [id number] Date: 01/13/2024 Age:39 days Reason for consult: Follow-up assessment;Maternal endocrine disorder;NICU baby;Late-preterm 34-36.6wks;Infant < 6lbs;Mother's request  Mother request to fit flanges of mother of NICU baby. Mother went to [facility] and received her [facility] loaner. WIC fitted mother with 27 mm flanges which she feels are too large. LC checked flange size. 24 mm flanges seem appropriate and are comfortable to mother at this time. Mother pumped 45 ml in 10 minutes.   Recommend 15 minutes with her next session. Mother states she pumped 30 ml from her R breast (nipple dimpled) and 90 ml from her L breast with her last pumping session. Mother states she prefers the [employer] pump. Suggest mother angle flanges down slightly when pumping. Reviewed cleaning and milk storage.  Mother aware.  Maternal Data Has patient been taught Hand Expression?: Yes  Feeding Mother's Current Feeding Choice: Breast Milk and Formula Nipple Type: Dr. Johann Muta  Lactation Tools Discussed/Used Tools: Flanges;Pump Flange Size: 24 Breast pump type: Double-Electric Breast Pump;Manual Pump Education: Milk Storage;Setup, frequency, and cleaning Reason for Pumping: NICU Pumped volume: 150 mL  Interventions  Education  Discharge Pump: Personal;DEBP;WIC Loaner ([employer]  [facility] loaner)  Consult Status Consult Status: NICU follow-up  Luellen Sages  RN, IBCLC 01/13/2024, 1:01 PM

## 2024-01-14 DIAGNOSIS — R69 Illness, unspecified: Secondary | ICD-10-CM | POA: Diagnosis not present

## 2024-01-18 ENCOUNTER — Encounter: Payer: Self-pay | Admitting: Obstetrics and Gynecology

## 2024-01-21 DIAGNOSIS — Z419 Encounter for procedure for purposes other than remedying health state, unspecified: Secondary | ICD-10-CM | POA: Diagnosis not present

## 2024-01-27 ENCOUNTER — Encounter: Payer: Self-pay | Admitting: Obstetrics and Gynecology

## 2024-01-28 ENCOUNTER — Ambulatory Visit

## 2024-02-04 ENCOUNTER — Encounter: Payer: Self-pay | Admitting: Obstetrics & Gynecology

## 2024-02-04 ENCOUNTER — Inpatient Hospital Stay (HOSPITAL_COMMUNITY)
Admission: AD | Admit: 2024-02-04 | Discharge: 2024-02-04 | Disposition: A | Discharge status: Home / Self Care | Attending: Family Medicine | Admitting: Family Medicine

## 2024-02-04 ENCOUNTER — Other Ambulatory Visit: Payer: Self-pay

## 2024-02-04 DIAGNOSIS — B372 Candidiasis of skin and nail: Secondary | ICD-10-CM

## 2024-02-04 DIAGNOSIS — T8149XA Infection following a procedure, other surgical site, initial encounter: Secondary | ICD-10-CM | POA: Diagnosis not present

## 2024-02-04 DIAGNOSIS — O9089 Other complications of the puerperium, not elsewhere classified: Secondary | ICD-10-CM | POA: Diagnosis not present

## 2024-02-04 DIAGNOSIS — L538 Other specified erythematous conditions: Secondary | ICD-10-CM | POA: Insufficient documentation

## 2024-02-04 DIAGNOSIS — O24410 Gestational diabetes mellitus in pregnancy, diet controlled: Secondary | ICD-10-CM

## 2024-02-04 MED ORDER — KETOCONAZOLE 2 % EX CREA: 1.0000 | TOPICAL_CREAM | Freq: Every day | CUTANEOUS | 0 refills | Status: AC

## 2024-02-04 NOTE — MAU Note (Signed)
 Suzanne Lane is a 38 y.o. at Unknown here in MAU reporting: noted last night her incision was itchng.  Looked at it and it was really red, has a smell. C/s 01/03/24. Still have vag bleeding, is on and off, has been heavy at times. Pt is pumping, good supply 'more than enough'.  Baby is great, gaining. Onset of complaint: yesterday Pain score: 7, burns Vitals:   02/04/24 1614  BP: 111/70  Pulse: 62  Resp: 17  Temp: 98.2 F (36.8 C)  SpO2: 97%      Lab orders placed from triage:

## 2024-02-04 NOTE — Discharge Instructions (Signed)
 It was a pleasure taking care of you today.  The wound does not look like it has a bacterial infection but more likely a yeast infection.  I sent a treatment called ketoconazole to your pharmacy.  The key is also keeping it dry in between applications of the medicine.  If your symptoms worsen or have any other concerns you can return here or go to the clinic for evaluation.  Hope you have a great afternoon exclamation

## 2024-02-04 NOTE — MAU Provider Note (Signed)
 History     CSN: [id number]  Arrival date and time: 02/04/24 1523   Event Date/Time   First Provider Initiated Contact with Patient 02/04/24 1635      Chief Complaint  Patient presents with   Post-op Problem   HPI Patient 1 week postpartum presenting for evaluation for C-section wound infection.  Reports she noticed burning and itching around the wound.  Denies any fever or warmth around the wound.  Denies any abnormal drainage.  Just wanted to make sure it was not getting infected.  OB History     Gravida  4   Para  2   Term  1   Preterm  1   AB  2   Living  2      SAB  2   IAB  0   Ectopic  0   Multiple  0   Live Births  2           Past Medical History:  Diagnosis Date   Allergy    Penicillin   Chronic kidney disease    Complicated UTI (urinary tract infection) 02/21/2014   Gestational diabetes 2015   diet controlled   History of gestational diabetes mellitus (GDM) 02/02/2014   Early A1c neg 06/2023     Pyelonephritis affecting pregnancy in third trimester 02/20/2014   IMO SNOMED Dx Update Oct 2024     Small bowel obstruction [facility])    As infant with surgery; recent obstruction 2012   UTI (urinary tract infection) during pregnancy 09/28/2013   RX Keflex  09/28/13  01/16/14: EColi UTI      Past Surgical History:  Procedure Laterality Date   ABDOMINAL SURGERY     Surgery as infant   CESAREAN SECTION N/A 01/03/2024   Procedure: CESAREAN DELIVERY;  Surgeon: Fredirick Glenys RAMAN, MD;  Location: [facility] LD ORS;  Service: Obstetrics;  Laterality: N/A;   SMALL INTESTINE SURGERY     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    Family History  Problem Relation Age of Onset   Alcohol abuse Mother    Miscarriages / [place] Mother    Diabetes Father    Alcohol abuse Father    High blood pressure Father    Depression Father    ADD / ADHD Brother    Alcohol abuse Brother    Depression Brother    Sickle  cell trait Daughter    Cancer Maternal Aunt    Depression Maternal Aunt    Cancer Maternal Grandfather    Colon cancer Maternal Grandfather    Cancer Paternal Grandfather    Early death Paternal Grandfather    Stomach cancer Neg Hx    Rectal cancer Neg Hx    Esophageal cancer Neg Hx     Social History   Tobacco Use   Smoking status: Former    Current packs/day: 0.00    Average packs/day: 0.5 packs/day for 15.0 years (7.5 ttl pk-yrs)    Types: Cigarettes    Start date: 08/12/2003    Quit date: 09/10/2022    Years since quitting: 1.4   Smokeless tobacco: Never   Tobacco comments:    Smoked 4-5 a day on and off for about 15 years but recently quit.  Vaping Use   Vaping status: Never Used  Substance Use Topics   Alcohol use: Not Currently    Alcohol/week: 3.0 standard drinks of alcohol    Types: 2 Glasses of wine, 1 Cans of  beer per week   Drug use: No    Allergies:  Allergies  Allergen Reactions   Penicillins Hives    Can take cephalosporins- no reaction    Medications Prior to Admission  Medication Sig Dispense Refill Last Dose/Taking   ibuprofen  (ADVIL ) 600 MG tablet Take 1 tablet (600 mg total) by mouth every 6 (six) hours as needed. 30 tablet 0 Past Month   ketorolac  (TORADOL ) 10 MG tablet Take 1 tablet (10 mg total) by mouth every 6 (six) hours as needed. DO NOT TAKE IBUPROFEN  WHILE TAKING THIS MEDICATION. 20 tablet 0 Past Month   oxyCODONE -acetaminophen  (PERCOCET/ROXICET) 5-325 MG tablet Take 1-2 tablets by mouth every 6 (six) hours as needed. 30 tablet 0 Past Month   prenatal vitamin w/FE, FA (PRENATAL 1 + 1) 27-1 MG TABS tablet Take 1 tablet by mouth daily. 30 tablet 12 02/03/2024   gabapentin  (NEURONTIN ) 300 MG capsule Take 1 capsule (300 mg total) by mouth 2 (two) times daily for 10 days. (Patient not taking: Reported on 01/12/2024) 20 capsule 0    simethicone  (MYLICON) 80 MG chewable tablet Chew 1 tablet (80 mg total) by mouth as needed for flatulence. 30 tablet 0  More than a month   terconazole  (TERAZOL 7 ) 0.4 % vaginal cream Place 1 applicator vaginally at bedtime. (Patient not taking: Reported on 01/12/2024) 45 g 0     Review of Systems Physical Exam   Blood pressure 112/69, pulse 61, temperature 98.2 F (36.8 C), temperature source Oral, resp. rate 17, height 5' 2 (1.575 m), weight 88.1 kg, SpO2 97%, currently breastfeeding.  Physical Exam Vitals and nursing note reviewed.  Constitutional:      Appearance: Normal appearance.  HENT:     Head: Normocephalic and atraumatic.     Nose: No congestion or rhinorrhea.   Eyes:     Extraocular Movements: Extraocular movements intact.    Cardiovascular:     Rate and Rhythm: Normal rate.  Pulmonary:     Effort: Pulmonary effort is normal.  Abdominal:     Palpations: Abdomen is soft.     Tenderness: There is no abdominal tenderness.   Musculoskeletal:        General: Normal range of motion.     Cervical back: Normal range of motion.   Skin:    General: Skin is warm.     Capillary Refill: Capillary refill takes less than 2 seconds.     Comments: Healing cesarean wound with 2 patches of erythema consistent with yeast infection   Neurological:     General: No focal deficit present.     Mental Status: She is alert.     Cranial Nerves: No cranial nerve deficit.   Psychiatric:        Mood and Affect: Mood normal.        Behavior: Behavior normal.     MAU Course  Procedures  MDM Physical evaluation  Assessment and Plan  Suzanne Lane presents 1 month postpartum with concern for C-section wound infection.   Wound evaluation Focal erythema around points of the cesarean incision.  Concern for yeast infection.  No systemic symptoms.  No warmth around the incision.  Does not appear bacterial in origin.  Treatment of ketoconazole sent to patient's pharmacy.  Discussed return precautions as well as if she has any concerns she can call her clinic and follow-up for nurse visit and  evaluation.  No further questions or concerns patient discharged home.  Suzanne Lane 02/04/2024, 4:37 PM

## 2024-02-18 ENCOUNTER — Encounter: Payer: Self-pay | Admitting: Certified Nurse Midwife

## 2024-02-18 ENCOUNTER — Ambulatory Visit: Payer: Self-pay | Admitting: Certified Nurse Midwife

## 2024-02-18 ENCOUNTER — Other Ambulatory Visit: Payer: Self-pay

## 2024-02-18 DIAGNOSIS — O24410 Gestational diabetes mellitus in pregnancy, diet controlled: Secondary | ICD-10-CM

## 2024-02-18 DIAGNOSIS — Z3009 Encounter for other general counseling and advice on contraception: Secondary | ICD-10-CM | POA: Diagnosis not present

## 2024-02-18 DIAGNOSIS — Z3202 Encounter for pregnancy test, result negative: Secondary | ICD-10-CM | POA: Diagnosis not present

## 2024-02-18 DIAGNOSIS — Z1332 Encounter for screening for maternal depression: Secondary | ICD-10-CM | POA: Diagnosis not present

## 2024-02-18 DIAGNOSIS — Z30017 Encounter for initial prescription of implantable subdermal contraceptive: Secondary | ICD-10-CM

## 2024-02-18 LAB — POCT PREGNANCY, URINE: Preg Test, Ur: NEGATIVE

## 2024-02-18 MED ORDER — ETONOGESTREL 68 MG ~~LOC~~ IMPL
68.0000 mg | DRUG_IMPLANT | Freq: Once | SUBCUTANEOUS | Status: AC
  Administered 2024-02-18: 68 mg via SUBCUTANEOUS

## 2024-02-18 NOTE — Progress Notes (Signed)
 Pt wants Nexplanon

## 2024-02-18 NOTE — Progress Notes (Signed)
 GYNECOLOGY CLINIC PROCEDURE NOTE  Ms. Suzanne Lane is a 38 y.o. [id number] here for Nexplanon  insertion. No GYN concerns.  Last pap smear was on 08/10/23 and was normal.  No other gynecologic concerns.  Nexplanon  Insertion Procedure Patient was given informed consent, she signed consent form.  Patient does understand that irregular bleeding is a very common side effect of this medication. She was advised to have backup contraception for one week after placement. Pregnancy test in clinic today was negative.  Appropriate time out taken.  Patient's left arm was prepped and draped in the usual sterile fashion.. The ruler used to measure and mark insertion area.  Patient was prepped with alcohol swab and then injected with 3 ml of 1% lidocaine .  She was prepped with betadine, Nexplanon  removed from packaging,  Device confirmed in needle, then inserted full length of needle and withdrawn per handbook instructions. Nexplanon  was able to palpated in the patient's arm; patient palpated the insert herself. There was minimal blood loss.  Patient insertion site covered with guaze and a pressure bandage to reduce any bruising.  The patient tolerated the procedure well and was given post procedure instructions.   See MAR for details about Lot number and expiration date.   Emilio Delilah HERO, CNM 02/18/2024 2:24 PM

## 2024-02-18 NOTE — Progress Notes (Signed)
 Post Partum Visit Note  Suzanne Lane is a 38 y.o. [id number] female who presents for a postpartum visit. She is 6 weeks postpartum following a primary cesarean section.  I have fully reviewed the prenatal and intrapartum course. The delivery was at 34/4 gestational weeks.  Anesthesia: spinal. Postpartum course has been uncomplicated. Baby is doing well. Baby is feeding by Bottle feeding w/ breast milk. Bleeding thin lochia, but reports that she recently increased her daily activity. Bowel function is normal. Bladder function is normal. Patient is not sexually active. Contraception method is none. Postpartum depression screening: negative.   The pregnancy intention screening data noted above was reviewed. Potential methods of contraception were discussed. The patient elected to proceed with No data recorded.   Edinburgh Postnatal Depression Scale - 02/18/24 1114       Edinburgh Postnatal Depression Scale:  In the Past 7 Days   I have been able to laugh and see the funny side of things. 0    I have looked forward with enjoyment to things. 0    I have blamed myself unnecessarily when things went wrong. 0    I have been anxious or worried for no good reason. 0    I have felt scared or panicky for no good reason. 0    Things have been getting on top of me. 0    I have been so unhappy that I have had difficulty sleeping. 0    I have felt sad or miserable. 0    I have been so unhappy that I have been crying. 0    The thought of harming myself has occurred to me. 0    Edinburgh Postnatal Depression Scale Total 0          Health Maintenance Due  Topic Date Due   Hepatitis B Vaccines (1 of 3 - 19+ 3-dose series) Never done   HPV VACCINES (1 - 3-dose SCDM series) Never done    The following portions of the patient's history were reviewed and updated as appropriate: allergies, current medications, past family history, past medical history, past social history, past surgical history,  and problem list.  Review of Systems Pertinent items noted in HPI and remainder of comprehensive ROS otherwise negative.  Objective:  BP 109/75   Pulse 67   Ht 5' 2 (1.575 m)   Wt 194 lb 14.4 oz (88.4 kg)   LMP 05/06/2023   Breastfeeding Yes   BMI 35.65 kg/m    General:  alert, cooperative, and appears stated age   Breasts:  not indicated  Lungs: clear to auscultation bilaterally  Heart:  regular rate and rhythm, S1, S2 normal, no murmur, click, rub or gallop  Abdomen: soft, non-tender; bowel sounds normal; no masses,  no organomegaly   Wound well approximated incision, clean and dry. No areas suspicious of infection   GU exam:  not indicated       Assessment:    1. Postpartum care and examination - Patient doing well.   2. Birth control counseling - Desires Nexplanon  insertion today.  - Nexplanon  placed. See procedure note for details.   3. Cesarean delivery delivered - Incision well approximated. Patient desires to go swimming. Discussed what to look for for signs of infection. Discussed intermittent submersion however not for extensive amounts of time and the recommendation to make sure its completely dry once getting out. Discussed changing out of swim wear and other things to decrease risk of infection.  Normal postpartum exam.   Plan:   Essential components of care per ACOG recommendations:  1.  Mood and well being: Patient with negative depression screening today. Reviewed local resources for support.  - Patient tobacco use? No.   - hx of drug use? No.    2. Infant care and feeding:  -Patient currently breastmilk feeding? Yes. Reviewed importance of draining breast regularly to support lactation.  -Social determinants of health (SDOH) reviewed in EPIC. No concerns  3. Sexuality, contraception and birth spacing - Patient does not want a pregnancy in the next year.  Desired family size is 2 children.  - Reviewed reproductive life planning. Reviewed  contraceptive methods based on pt preferences and effectiveness.  Patient desired Hormonal Implant today.   - Discussed birth spacing of 18 months  4. Sleep and fatigue -Encouraged family/partner/community support of 4 hrs of uninterrupted sleep to help with mood and fatigue  5. Physical Recovery  - Discussed patients delivery and complications. She describes her labor as good. - Patient had a C-section. SABRA Perineal healing reviewed. Patient expressed understanding - Patient has urinary incontinence? No. - Patient is safe to resume physical and sexual activity at her desired opportunity   6.  Health Maintenance - HM due items addressed Yes - Last pap smear  Diagnosis  Date Value Ref Range Status  08/10/2023   Final   - Negative for intraepithelial lesion or malignancy (NILM)   Pap smear not done at today's visit.  -Breast Cancer screening indicated? No.   7. Chronic Disease/Pregnancy Condition follow up: Gestational Diabetes. 2hr GTT collected today.   - PCP follow up  Claris CHRISTELLA Cedar, CNM [facility], [facility]

## 2024-02-19 LAB — GLUCOSE TOLERANCE, 2 HOURS
Glucose, 2 hour: 83 mg/dL (ref 70–139)
Glucose, GTT - Fasting: 77 mg/dL (ref 70–99)

## 2024-02-20 DIAGNOSIS — Z419 Encounter for procedure for purposes other than remedying health state, unspecified: Secondary | ICD-10-CM | POA: Diagnosis not present

## 2024-02-22 ENCOUNTER — Ambulatory Visit: Payer: Self-pay | Admitting: Certified Nurse Midwife

## 2024-03-22 DIAGNOSIS — Z419 Encounter for procedure for purposes other than remedying health state, unspecified: Secondary | ICD-10-CM | POA: Diagnosis not present

## 2024-04-22 DIAGNOSIS — Z419 Encounter for procedure for purposes other than remedying health state, unspecified: Secondary | ICD-10-CM | POA: Diagnosis not present

## 2024-05-22 DIAGNOSIS — Z419 Encounter for procedure for purposes other than remedying health state, unspecified: Secondary | ICD-10-CM | POA: Diagnosis not present

## 2024-06-01 ENCOUNTER — Encounter: Payer: Self-pay | Admitting: Nurse Practitioner

## 2024-06-01 ENCOUNTER — Ambulatory Visit: Admitting: Nurse Practitioner

## 2024-06-01 VITALS — BP 110/74 | HR 77 | Temp 98.9°F | Ht 62.0 in | Wt 203.0 lb

## 2024-06-01 DIAGNOSIS — E782 Mixed hyperlipidemia: Secondary | ICD-10-CM | POA: Diagnosis not present

## 2024-06-01 DIAGNOSIS — Z23 Encounter for immunization: Secondary | ICD-10-CM

## 2024-06-01 NOTE — Progress Notes (Signed)
 Subjective:  Patient ID: Suzanne Lane , female    DOB: 1986/08/09 , 38 y.o.   MRN: [id number]  Chief Complaint  Patient presents with   Hyperlipidemia    Patient presents today for a Chol follow up, Patient reports compliance with medication. Patient denies any chest pain, SOB, or headaches. Patient has no concerns today.     HPI Discussed the use of AI scribe software for clinical note transcription with the patient, who gave verbal consent to proceed.  History of Present Illness Suzanne Lane is a 38 year old female who presents for a cholesterol follow-up.  She has been managing her cholesterol by eliminating processed foods and preparing meals at home. She avoids fried and fatty foods. She is currently breastfeeding and is focused on maintaining a healthy diet for her baby.  She has a history of gestational diabetes during her last pregnancy, which resolved postpartum. She experienced gestational diabetes during her recent pregnancy as well.  There is no known family history of cholesterol issues, although she suspects there might be a history on her father's side.  She has experienced mild heartburn for a few days, which she associates with eating pasta and tomato sauce. No chest pain or shortness of breath.  She reports that she received a flu shot.   Past Medical History:  Diagnosis Date   Allergy    Penicillin   Chronic kidney disease    Complicated UTI (urinary tract infection) 02/21/2014   Gestational diabetes 2015   diet controlled   History of gestational diabetes mellitus (GDM) 02/02/2014   Early A1c neg 06/2023     Pyelonephritis affecting pregnancy in third trimester 02/20/2014   IMO SNOMED Dx Update Oct 2024     Small bowel obstruction [facility])    As infant with surgery; recent obstruction 2012   UTI (urinary tract infection) during pregnancy 09/28/2013   RX Keflex  09/28/13  01/16/14: EColi UTI       Family History  Problem Relation Age of  Onset   Alcohol abuse Mother    Miscarriages / Stillbirths Mother    Diabetes Father    Alcohol abuse Father    High blood pressure Father    Depression Father    ADD / ADHD Brother    Alcohol abuse Brother    Depression Brother    Sickle cell trait Daughter    Cancer Maternal Aunt    Depression Maternal Aunt    Cancer Maternal Grandfather    Colon cancer Maternal Grandfather    Cancer Paternal Grandfather    Early death Paternal Grandfather    Stomach cancer Neg Hx    Rectal cancer Neg Hx    Esophageal cancer Neg Hx      Current Outpatient Medications:    ibuprofen  (ADVIL ) 600 MG tablet, Take 1 tablet (600 mg total) by mouth every 6 (six) hours as needed., Disp: 30 tablet, Rfl: 0   ketoconazole  (NIZORAL ) 2 % cream, Apply 1 Application topically daily., Disp: 15 g, Rfl: 0   ketorolac  (TORADOL ) 10 MG tablet, Take 1 tablet (10 mg total) by mouth every 6 (six) hours as needed. DO NOT TAKE IBUPROFEN  WHILE TAKING THIS MEDICATION., Disp: 20 tablet, Rfl: 0   oxyCODONE -acetaminophen  (PERCOCET/ROXICET) 5-325 MG tablet, Take 1-2 tablets by mouth every 6 (six) hours as needed., Disp: 30 tablet, Rfl: 0   prenatal vitamin w/FE, FA (PRENATAL 1 + 1) 27-1 MG TABS tablet, Take 1 tablet by mouth daily., Disp: 30 tablet,  Rfl: 12   simethicone  (MYLICON) 80 MG chewable tablet, Chew 1 tablet (80 mg total) by mouth as needed for flatulence., Disp: 30 tablet, Rfl: 0   gabapentin  (NEURONTIN ) 300 MG capsule, Take 1 capsule (300 mg total) by mouth 2 (two) times daily for 10 days. (Patient not taking: Reported on 06/01/2024), Disp: 20 capsule, Rfl: 0   Allergies  Allergen Reactions   Penicillins Hives    Can take cephalosporins- no reaction     Review of Systems  Constitutional: Negative.   Respiratory: Negative.    Cardiovascular: Negative.   Gastrointestinal:  Positive for constipation. Negative for abdominal pain.  Neurological: Negative.   Psychiatric/Behavioral: Negative.       Today's Vitals    06/01/24 1041  BP: 110/74  Pulse: 77  Temp: 98.9 F (37.2 C)  TempSrc: Oral  Weight: 203 lb (92.1 kg)  Height: 5' 2 (1.575 m)  PainSc: 0-No pain   Body mass index is 37.13 kg/m.  Wt Readings from Last 3 Encounters:  06/01/24 203 lb (92.1 kg)  02/18/24 194 lb 14.4 oz (88.4 kg)  02/04/24 194 lb 4.8 oz (88.1 kg)      Objective:  Physical Exam Vitals and nursing note reviewed.  Constitutional:      General: She is not in acute distress.    Appearance: Normal appearance. She is well-developed. She is obese.  HENT:     Head: Normocephalic and atraumatic.  Eyes:     Pupils: Pupils are equal, round, and reactive to light.  Cardiovascular:     Rate and Rhythm: Normal rate and regular rhythm.     Pulses: Normal pulses.     Heart sounds: Normal heart sounds. No murmur heard. Pulmonary:     Effort: Pulmonary effort is normal. No respiratory distress.     Breath sounds: Normal breath sounds. No wheezing.  Musculoskeletal:        General: Normal range of motion.  Skin:    General: Skin is warm and dry.     Capillary Refill: Capillary refill takes less than 2 seconds.  Neurological:     General: No focal deficit present.     Mental Status: She is alert and oriented to person, place, and time.     Cranial Nerves: No cranial nerve deficit.  Psychiatric:        Mood and Affect: Mood normal.      Assessment And Plan:  Mixed hyperlipidemia Assessment & Plan: Mixed hyperlipidemia with previous elevated cholesterol levels. Breastfeeding may influence cholesterol levels. Possible paternal history of hyperlipidemia. - Order blood work to recheck cholesterol levels. - Consider lipoprotein A testing if cholesterol remains elevated. - Consider referral to lipid specialist if genetic predisposition is identified.  Orders: -     Lipid panel  Need for influenza vaccination Assessment & Plan: Influenza vaccine administered Encouraged to take Tylenol  as needed for fever or muscle  aches.   Orders: -     Flu vaccine trivalent PF, 6mos and older(Flulaval,Afluria,Fluarix,Fluzone)    Return for keep same next.  Patient was given opportunity to ask questions. Patient verbalized understanding of the plan and was able to repeat key elements of the plan. All questions were answered to their satisfaction.    LILLETTE Gaines Ada, FNP, have reviewed all documentation for this visit. The documentation on 06/01/24 for the exam, diagnosis, procedures, and orders are all accurate and complete.   IF YOU HAVE BEEN REFERRED TO A SPECIALIST, IT MAY TAKE 1-2 WEEKS TO SCHEDULE/PROCESS THE  REFERRAL. IF YOU HAVE NOT HEARD FROM US /SPECIALIST IN TWO WEEKS, PLEASE GIVE US  A CALL AT [phone].

## 2024-06-02 DIAGNOSIS — E782 Mixed hyperlipidemia: Secondary | ICD-10-CM | POA: Diagnosis not present

## 2024-06-03 LAB — LIPID PANEL
Chol/HDL Ratio: 4.9 ratio — ABNORMAL HIGH (ref 0.0–4.4)
Cholesterol, Total: 182 mg/dL (ref 100–199)
HDL: 37 mg/dL — ABNORMAL LOW (ref >39)
LDL Chol Calc (NIH): 121 mg/dL — ABNORMAL HIGH (ref 0–99)
Triglycerides: 130 mg/dL (ref 0–149)
VLDL Cholesterol Cal: 24 mg/dL (ref 5–40)

## 2024-06-08 ENCOUNTER — Ambulatory Visit: Payer: Self-pay | Admitting: Nurse Practitioner

## 2024-06-08 NOTE — Assessment & Plan Note (Signed)
 Mixed hyperlipidemia with previous elevated cholesterol levels. Breastfeeding may influence cholesterol levels. Possible paternal history of hyperlipidemia. - Order blood work to recheck cholesterol levels. - Consider lipoprotein A testing if cholesterol remains elevated. - Consider referral to lipid specialist if genetic predisposition is identified.

## 2024-06-08 NOTE — Assessment & Plan Note (Signed)
 Influenza vaccine administered Encouraged to take Tylenol as needed for fever or muscle aches.

## 2024-07-22 DIAGNOSIS — Z419 Encounter for procedure for purposes other than remedying health state, unspecified: Secondary | ICD-10-CM | POA: Diagnosis not present

## 2024-11-30 ENCOUNTER — Encounter: Payer: Self-pay | Admitting: Nurse Practitioner
